# Patient Record
Sex: Female | Born: 1950 | Race: Asian | Hispanic: No | Smoking: Never smoker
Health system: Southern US, Community
[De-identification: ages and names within clinical notes are randomized; demographics above are authoritative.]

## PROBLEM LIST (undated history)

## (undated) DIAGNOSIS — I209 Angina pectoris, unspecified: Secondary | ICD-10-CM

## (undated) DIAGNOSIS — M5416 Radiculopathy, lumbar region: Secondary | ICD-10-CM

## (undated) DIAGNOSIS — K219 Gastro-esophageal reflux disease without esophagitis: Secondary | ICD-10-CM

## (undated) DIAGNOSIS — F32A Depression, unspecified: Secondary | ICD-10-CM

## (undated) DIAGNOSIS — I10 Essential (primary) hypertension: Secondary | ICD-10-CM

## (undated) DIAGNOSIS — M810 Age-related osteoporosis without current pathological fracture: Secondary | ICD-10-CM

## (undated) DIAGNOSIS — S92309A Fracture of unspecified metatarsal bone(s), unspecified foot, initial encounter for closed fracture: Secondary | ICD-10-CM

## (undated) DIAGNOSIS — M1990 Unspecified osteoarthritis, unspecified site: Secondary | ICD-10-CM

## (undated) HISTORY — DX: Fracture of unspecified metatarsal bone(s), unspecified foot, initial encounter for closed fracture: S92.309A

## (undated) HISTORY — DX: Unspecified osteoarthritis, unspecified site: M19.90

## (undated) HISTORY — PX: ABDOMINAL HYSTERECTOMY: SHX81

## (undated) HISTORY — DX: Age-related osteoporosis without current pathological fracture: M81.0

## (undated) HISTORY — PX: APPENDECTOMY: SHX54

## (undated) HISTORY — DX: Radiculopathy, lumbar region: M54.16

## (undated) HISTORY — DX: Essential (primary) hypertension: I10

---

## 2013-01-30 ENCOUNTER — Ambulatory Visit (INDEPENDENT_AMBULATORY_CARE_PROVIDER_SITE_OTHER): Payer: No Typology Code available for payment source | Admitting: Podiatry

## 2013-01-30 ENCOUNTER — Encounter: Payer: Self-pay | Admitting: Podiatry

## 2013-01-30 VITALS — BP 156/94 | HR 71 | Ht 61.0 in | Wt 148.0 lb

## 2013-01-30 DIAGNOSIS — R937 Abnormal findings on diagnostic imaging of other parts of musculoskeletal system: Secondary | ICD-10-CM

## 2013-01-30 DIAGNOSIS — S92309A Fracture of unspecified metatarsal bone(s), unspecified foot, initial encounter for closed fracture: Secondary | ICD-10-CM

## 2013-01-30 DIAGNOSIS — M79673 Pain in unspecified foot: Secondary | ICD-10-CM | POA: Insufficient documentation

## 2013-01-30 DIAGNOSIS — M79609 Pain in unspecified limb: Secondary | ICD-10-CM

## 2013-01-30 DIAGNOSIS — R2991 Unspecified symptoms and signs involving the musculoskeletal system: Secondary | ICD-10-CM

## 2013-01-30 HISTORY — DX: Fracture of unspecified metatarsal bone(s), unspecified foot, initial encounter for closed fracture: S92.309A

## 2013-01-30 NOTE — Patient Instructions (Signed)
X-ray examination reveal fractured Metatarsal base left foot. Cast applied.  Minimum ambulation advised.  Use crutches as needed. Return in 3 weeks to replace cast.

## 2013-01-30 NOTE — Progress Notes (Signed)
Left foot turned over 2 weeks ago and had pain and swelling.  Has had spinal surgery x 2 for bulged out disc at L4,5 about 1978 and 1980 while in [place].   Review of Systems - General ROS: positive for  - chills, fever, sleep disturbance and Difficulty walking from weak left leg.  Ophthalmic ROS: negative for - decreased vision, double vision, eye pain or loss of vision ENT ROS: Had fallen while showering and ruptured left ear drum on 01/01/13. Being under care by ENT doctor.  Allergy and Immunology ROS: negative Respiratory ROS: no cough, shortness of breath, or wheezing Cardiovascular ROS: no chest pain or dyspnea on exertion Gastrointestinal ROS: no abdominal pain, change in bowel habits, or black or bloody stools Genito-Urinary ROS: no dysuria, trouble voiding, or hematuria Musculoskeletal ROS: Left foot and knee has lost strength with disc problem.  Neurological ROS: no TIA or stroke symptoms Dermatological ROS: negative.  Objective: Dermatologic: No open lesions. Nails are within normal. Vascular: All pedal pulses are palpable.  Orthopedic: Rectus foot without gross osseous deformity. No muscle strength of Peroneal muscle group and weak   Neurologic: All epicritic and tactile sensations grossly intact. X-ray show transverse fracture at the 5th Metatarsal base left. Presence of Naviculocuneiform bar bilateral.  Assessment: Fractured Metatarsal base left 5th.  Treatment: Casted left loewr limb with fiber glass. Return in 3 weeks to replace cast.

## 2013-02-20 ENCOUNTER — Encounter: Payer: Self-pay | Admitting: Podiatry

## 2013-02-20 ENCOUNTER — Ambulatory Visit (INDEPENDENT_AMBULATORY_CARE_PROVIDER_SITE_OTHER): Payer: No Typology Code available for payment source | Admitting: Podiatry

## 2013-02-20 VITALS — BP 158/94 | HR 77

## 2013-02-20 DIAGNOSIS — S92309A Fracture of unspecified metatarsal bone(s), unspecified foot, initial encounter for closed fracture: Secondary | ICD-10-CM

## 2013-02-20 DIAGNOSIS — M79609 Pain in unspecified limb: Secondary | ICD-10-CM

## 2013-02-20 NOTE — Patient Instructions (Signed)
3 weeks follow up on fractured 5th Metatarsal base left. Cast replaced. Return in 3 weeks.

## 2013-02-20 NOTE — Progress Notes (Signed)
3 weeks following cast on left foot for fractured 5th Metatarsal base, Jones' Fracture. Stated that she had minimum pain. Cast removed. Swelling is down. Skin is hyperpigmented following the injury and extensive edema.  Skin is normal. Left lower limb placed back in Fiberglass cast with Cast boot. Instruction to light ambulation. Return in 3 weeks.

## 2013-03-13 ENCOUNTER — Ambulatory Visit (INDEPENDENT_AMBULATORY_CARE_PROVIDER_SITE_OTHER): Payer: No Typology Code available for payment source | Admitting: Podiatry

## 2013-03-13 ENCOUNTER — Encounter: Payer: Self-pay | Admitting: Podiatry

## 2013-03-13 VITALS — BP 178/93 | HR 73

## 2013-03-13 DIAGNOSIS — S92309A Fracture of unspecified metatarsal bone(s), unspecified foot, initial encounter for closed fracture: Secondary | ICD-10-CM

## 2013-03-13 NOTE — Progress Notes (Signed)
6 weeks following cast placed in left foot. This was the 2nd cast after changing the first cast 3 weeks ago. Patient denies having any discomfort. The cast is worn out in forefoot area. Cast was removed. There was no edema or abnormal appearance on the left foot at affected area. X-ray taken and noted of positive signs of bone healing with bone mass present in the fracture site.   Assessment: Satisfactory bone healing following Yetta Barre' fracture left 5th Metatarsal base.  Plan: Cast removed. Instructed to be in well supported shoe gear.

## 2013-03-13 NOTE — Patient Instructions (Signed)
Good bone healing of the 5th Metatarsal base fracture seen in X-ray.  Stay in well supported shoes for the next few weeks.

## 2013-09-13 ENCOUNTER — Other Ambulatory Visit: Payer: Self-pay | Admitting: Podiatry

## 2013-09-13 DIAGNOSIS — M25569 Pain in unspecified knee: Secondary | ICD-10-CM

## 2014-10-21 ENCOUNTER — Ambulatory Visit (INDEPENDENT_AMBULATORY_CARE_PROVIDER_SITE_OTHER): Payer: 59 | Admitting: Podiatry

## 2014-10-21 ENCOUNTER — Encounter: Payer: Self-pay | Admitting: Podiatry

## 2014-10-21 VITALS — BP 160/83 | HR 72 | Ht 61.0 in | Wt 146.0 lb

## 2014-10-21 DIAGNOSIS — R609 Edema, unspecified: Secondary | ICD-10-CM | POA: Diagnosis not present

## 2014-10-21 DIAGNOSIS — S92302A Fracture of unspecified metatarsal bone(s), left foot, initial encounter for closed fracture: Secondary | ICD-10-CM

## 2014-10-21 DIAGNOSIS — S92309A Fracture of unspecified metatarsal bone(s), unspecified foot, initial encounter for closed fracture: Secondary | ICD-10-CM | POA: Insufficient documentation

## 2014-10-21 DIAGNOSIS — M79672 Pain in left foot: Secondary | ICD-10-CM | POA: Diagnosis not present

## 2014-10-21 DIAGNOSIS — R600 Localized edema: Secondary | ICD-10-CM

## 2014-10-21 NOTE — Progress Notes (Signed)
Subjective: 64 year old female presents with limping gait stating that she slipped on a stairs and her foot turned in 2 weeks ago. The foot became swollen and painful. The pain is not subsiding after two weeks and wants to be checked out.   Objective: Dermatologic: No open lesions. Nails are within normal. Vascular: All pedal pulses are palpable. Mild edema on left foot lateral 1/2. No discoloration noted.  Orthopedic: Rectus foot without gross osseous deformity. Pain in left foot with weight bearing.  Neurologic: All epicritic and tactile sensations grossly intact. X-ray show a oblique fracture of 5th Metatarsal shaft at mid point with separation of fracture fragment and space in between the fracture.   Assessment: Spiral fracture of Metatarsal shaft at mid point with significant gap inbetween the proximal and distal fracture fragment.   Plan: Reviewed the findings and available treatment options. Reviewed open reduction and internal fixation of the fracture fragment under IV sedation at surgery center.  Surgery consent form reviewed. Left lower limb placed in [place] and CAM walker.

## 2014-10-21 NOTE — Patient Instructions (Signed)
Seen for pain in left foot. X-rau findings reveal fractured bone. Left foot placed in [place] and CAM walker. Scheduled for Open reduction and internal fixation left fractured bone.

## 2014-10-23 DIAGNOSIS — S92309A Fracture of unspecified metatarsal bone(s), unspecified foot, initial encounter for closed fracture: Secondary | ICD-10-CM | POA: Diagnosis not present

## 2014-10-23 HISTORY — PX: OPEN REDUCTION INTERNAL FIXATION (ORIF) DISTAL PHALANX: SHX6236

## 2014-10-28 ENCOUNTER — Encounter: Payer: 59 | Admitting: Podiatry

## 2014-10-31 ENCOUNTER — Ambulatory Visit (INDEPENDENT_AMBULATORY_CARE_PROVIDER_SITE_OTHER): Payer: 59 | Admitting: Podiatry

## 2014-10-31 ENCOUNTER — Encounter: Payer: Self-pay | Admitting: Podiatry

## 2014-10-31 VITALS — BP 185/83 | HR 76

## 2014-10-31 DIAGNOSIS — S92301D Fracture of unspecified metatarsal bone(s), right foot, subsequent encounter for fracture with routine healing: Secondary | ICD-10-CM

## 2014-10-31 NOTE — Patient Instructions (Signed)
Cast replaced. Minimum ambulation and use crutches.

## 2014-10-31 NOTE — Progress Notes (Signed)
1 week post op following ORIF left 5th metatarsal. Patient came in left lower limb in cast walking without crutches or walker.  Stated that her cast is uncomfortable to stand on. Cast removed and examined foot. No edema or abnormal finding. Wound is healing normal. Fiberglass cast replaced.

## 2014-11-28 ENCOUNTER — Ambulatory Visit (INDEPENDENT_AMBULATORY_CARE_PROVIDER_SITE_OTHER): Payer: 59 | Admitting: Podiatry

## 2014-11-28 ENCOUNTER — Encounter: Payer: Self-pay | Admitting: Podiatry

## 2014-11-28 ENCOUNTER — Encounter: Payer: 59 | Admitting: Podiatry

## 2014-11-28 VITALS — BP 165/100 | HR 73

## 2014-11-28 DIAGNOSIS — S92301D Fracture of unspecified metatarsal bone(s), right foot, subsequent encounter for fracture with routine healing: Secondary | ICD-10-CM

## 2014-11-28 NOTE — Patient Instructions (Signed)
Normal wound healing. Use CAM walker while weight bearing for the next 3 weeks. Return as needed.

## 2014-11-28 NOTE — Progress Notes (Signed)
5 weeks following ORIF for fractured 5th Metatarsal shaft.  Been on cast since the surgery. Patient has tolerated cast well with exception of occasional pain at the wound site. Cast removed.Wound has healed well. No edema or erythema noted. X-ray show good bone healing with bone callus surrounding the fracture site. Fracture fragments are in good approximation with internal fixation screw in place.across the fracture line. Assessment: Satisfactory bone healing. Plan: Place in CAM walker for the next 3 weeks. Patient wants to use the CAM walker she has at home.

## 2015-02-10 ENCOUNTER — Ambulatory Visit (INDEPENDENT_AMBULATORY_CARE_PROVIDER_SITE_OTHER): Payer: 59 | Admitting: Podiatry

## 2015-02-10 ENCOUNTER — Encounter: Payer: Self-pay | Admitting: Podiatry

## 2015-02-10 VITALS — BP 196/93 | HR 67

## 2015-02-10 DIAGNOSIS — M659 Synovitis and tenosynovitis, unspecified: Secondary | ICD-10-CM | POA: Diagnosis not present

## 2015-02-10 DIAGNOSIS — R2991 Unspecified symptoms and signs involving the musculoskeletal system: Secondary | ICD-10-CM

## 2015-02-10 DIAGNOSIS — M79605 Pain in left leg: Secondary | ICD-10-CM

## 2015-02-10 DIAGNOSIS — M79673 Pain in unspecified foot: Secondary | ICD-10-CM | POA: Diagnosis not present

## 2015-02-10 DIAGNOSIS — S92301D Fracture of unspecified metatarsal bone(s), right foot, subsequent encounter for fracture with routine healing: Secondary | ICD-10-CM | POA: Diagnosis not present

## 2015-02-10 DIAGNOSIS — M7742 Metatarsalgia, left foot: Secondary | ICD-10-CM | POA: Diagnosis not present

## 2015-02-10 DIAGNOSIS — M259 Joint disorder, unspecified: Secondary | ICD-10-CM | POA: Diagnosis not present

## 2015-02-10 DIAGNOSIS — M79606 Pain in leg, unspecified: Secondary | ICD-10-CM | POA: Insufficient documentation

## 2015-02-10 DIAGNOSIS — G8929 Other chronic pain: Secondary | ICD-10-CM | POA: Insufficient documentation

## 2015-02-10 NOTE — Patient Instructions (Signed)
Seen for left foot pain. Abnormal bone structure. May benefit from Ankle brace left foot.

## 2015-02-10 NOTE — Progress Notes (Signed)
Subjective: 64 year old female presents complaining of pain on left foot.  Patient points lateral column of left foot being the source of foot pain. Patient has a history of fractured 5th metatarsal bone that was repaired through Open Reduction Internal Fixation with screw.  S/P ORIF with screw fixation 5th Metatarsal left foot 3 months and 2 weeks.  Injury occurred 10/07/14, repair done 10/23/14. Also has history of spinal surgery x 2 for bulged out disc at L4,5 about 1978 and 1980 while in [place].   Objective: Dermatologic: Old surgical site healed well. No abnormal skin lesions noted. Vascular: All pedal pulses are palpable.  Orthopedic: Positive of elevated first ray left with varus forefoot.  No muscle strength of Peroneal muscle group weak ness left  Neurologic: All epicritic and tactile sensations grossly intact.  X-ray of left foot repeated. Findings reveal well consolidated fracture site 5th metatarsal shaft with screw in place left foot. Significant dorsal elevation of the first ray left foot. Mild osteoporosis observed in forefoot.   Assessment: Tenosynovitis lateral column left foot. Peroneal tendon dysfunction left. Elevated first ray left.  Treatment: Reviewed clinical findings and available treatment options. Left foot placed in Ankle brace.  Return as needed.

## 2015-04-08 ENCOUNTER — Ambulatory Visit (INDEPENDENT_AMBULATORY_CARE_PROVIDER_SITE_OTHER): Payer: 59 | Admitting: Podiatry

## 2015-04-08 ENCOUNTER — Inpatient Hospital Stay: Admission: RE | Admit: 2015-04-08 | Payer: Self-pay | Source: Ambulatory Visit

## 2015-04-08 ENCOUNTER — Encounter: Payer: Self-pay | Admitting: Podiatry

## 2015-04-08 VITALS — BP 151/72 | HR 70

## 2015-04-08 DIAGNOSIS — S93409A Sprain of unspecified ligament of unspecified ankle, initial encounter: Secondary | ICD-10-CM | POA: Insufficient documentation

## 2015-04-08 DIAGNOSIS — S93402A Sprain of unspecified ligament of left ankle, initial encounter: Secondary | ICD-10-CM | POA: Diagnosis not present

## 2015-04-08 DIAGNOSIS — M21962 Unspecified acquired deformity of left lower leg: Secondary | ICD-10-CM

## 2015-04-08 DIAGNOSIS — S99922A Unspecified injury of left foot, initial encounter: Secondary | ICD-10-CM | POA: Diagnosis not present

## 2015-04-08 NOTE — Patient Instructions (Signed)
Reviewed clinical findings and available treatment options. Patient and her family wishes to have the foot problem corrected. Cotton osteotomy with bone graft discussed for possible surgical option. Consent form reviewed and signed.

## 2015-04-08 NOTE — Progress Notes (Signed)
Subjective: 64 year old female presents complaining of pain in left foot. She turned over the foot last week and now it hurts on top and side of left foot.   Past history reveal she has had spinal surgery x 2 for bulged out disc at L4,5 about 1978 and 1980 while in [place].  Recent left foot ORIF on 5th Metatarsal done in July 2016 and healed without complication.  Review of Systems - General ROS: Negative for - chills, fever, sleep disturbance and Difficulty walking from weak left leg.  Ophthalmic ROS: negative for - decreased vision, double vision, eye pain or loss of vision ENT ROS: Had fallen while showering and ruptured left ear drum on 01/01/13. Being under care by ENT doctor.  Allergy and Immunology ROS: negative Respiratory ROS: no cough, shortness of breath, or wheezing Cardiovascular ROS: no chest pain or dyspnea on exertion. Takes antihypertensive medications.  Gastrointestinal ROS: no abdominal pain, change in bowel habits, or black or bloody stools Genito-Urinary ROS: no dysuria, trouble voiding, or hematuria Musculoskeletal ROS: Weak left lower limb following the disc problem.  Neurological ROS: no TIA or stroke symptoms Dermatological ROS: negative.  Objective: Dermatologic: No open lesions. Nails are within normal. Vascular: All pedal pulses are palpable.  Orthopedic: Positive of forefoot varus with elevated first ray. Pain with range of motion in rearfoot over 4th and 5th metatarsal base.  Neurologic: All epicritic and tactile sensations grossly intact.  X-ray of left foot show post surgical internal fixation screw over transverse fracture site at the mid point of 5th Metatarsal shaft left foot. No other acute changes seen over painful site.  Assessment: Ankle sprain with pain in foot. Forefoot varus with elevated first Metatarsal bone.  Frequent loss of balance and frequent ankle sprain.  Plan: Reviewed clinical findings and available treatment options. Patient  and her family wishes to have the foot problem corrected. Cotton osteotomy with bone graft discussed for possible surgical option. Consent form reviewed and signed.

## 2015-04-15 ENCOUNTER — Other Ambulatory Visit: Payer: Self-pay | Admitting: Podiatry

## 2015-04-15 MED ORDER — OXYCODONE-ACETAMINOPHEN 7.5-325 MG PO TABS: 1.0000 | ORAL_TABLET | Freq: Four times a day (QID) | ORAL | Status: DC | PRN

## 2015-04-15 MED ORDER — NABUMETONE 500 MG PO TABS: 500.0000 mg | ORAL_TABLET | Freq: Two times a day (BID) | ORAL | Status: DC

## 2015-04-16 DIAGNOSIS — M216X9 Other acquired deformities of unspecified foot: Secondary | ICD-10-CM | POA: Diagnosis not present

## 2015-04-16 HISTORY — PX: OTHER SURGICAL HISTORY: SHX169

## 2015-04-19 ENCOUNTER — Other Ambulatory Visit: Payer: Self-pay | Admitting: Podiatry

## 2015-04-19 MED ORDER — PROMETHAZINE HCL 12.5 MG PO TABS: 12.5000 mg | ORAL_TABLET | Freq: Four times a day (QID) | ORAL | Status: DC | PRN

## 2015-04-23 ENCOUNTER — Encounter: Payer: Self-pay | Admitting: Podiatry

## 2015-04-23 ENCOUNTER — Ambulatory Visit (INDEPENDENT_AMBULATORY_CARE_PROVIDER_SITE_OTHER): Payer: 59 | Admitting: Podiatry

## 2015-04-23 VITALS — BP 146/72 | HR 85

## 2015-04-23 DIAGNOSIS — Z9889 Other specified postprocedural states: Secondary | ICD-10-CM | POA: Insufficient documentation

## 2015-04-23 NOTE — Progress Notes (Signed)
S/P 1 week Cotton osteotomy left (04/15/15). Had nausea problem with medication. Did better since taking Phenergan. No problem with cast. Doing exercise and able to wiggle toes. No sign of edema. Colors normal. Continue current level of activity and return in 2 weeks.

## 2015-04-23 NOTE — Patient Instructions (Signed)
Status post one week left foot surgery. Doing well in cast. Continue current level of activity. Return in 2 weeks.

## 2015-05-07 ENCOUNTER — Inpatient Hospital Stay: Admission: RE | Admit: 2015-05-07 | Payer: Self-pay | Source: Ambulatory Visit

## 2015-05-07 ENCOUNTER — Ambulatory Visit (INDEPENDENT_AMBULATORY_CARE_PROVIDER_SITE_OTHER): Payer: BLUE CROSS/BLUE SHIELD | Admitting: Podiatry

## 2015-05-07 ENCOUNTER — Encounter: Payer: Self-pay | Admitting: Podiatry

## 2015-05-07 DIAGNOSIS — M79605 Pain in left leg: Secondary | ICD-10-CM | POA: Diagnosis not present

## 2015-05-07 DIAGNOSIS — M21962 Unspecified acquired deformity of left lower leg: Secondary | ICD-10-CM

## 2015-05-07 NOTE — Patient Instructions (Signed)
Cast replaced. Continue with current level of care. Return in 3 weeks.

## 2015-05-07 NOTE — Progress Notes (Signed)
S/P 3 week Cotton osteotomy left (04/15/15). Patient has done well with cast on left lower limb.  Cast removed.  Incision site healed well.  Minimum forefoot edema noted.  Post op X-ray show grafted bone in place secured by a staple implant, consistent with the surgery performed. No abnormal findings noted.   Left lower limb cleansed and placed back in Fiberglass cast with cast boot. Return in 3 weeks.

## 2015-05-27 ENCOUNTER — Inpatient Hospital Stay: Admission: RE | Admit: 2015-05-27 | Payer: Self-pay | Source: Ambulatory Visit

## 2015-05-27 ENCOUNTER — Encounter: Payer: Self-pay | Admitting: Podiatry

## 2015-05-27 ENCOUNTER — Ambulatory Visit (INDEPENDENT_AMBULATORY_CARE_PROVIDER_SITE_OTHER): Payer: BLUE CROSS/BLUE SHIELD | Admitting: Podiatry

## 2015-05-27 DIAGNOSIS — M79605 Pain in left leg: Secondary | ICD-10-CM | POA: Diagnosis not present

## 2015-05-27 DIAGNOSIS — M21962 Unspecified acquired deformity of left lower leg: Secondary | ICD-10-CM

## 2015-05-27 NOTE — Patient Instructions (Signed)
6 weeks post op wound healing normal. Cast removed. Placed in CAM walker. Return in 3 weeks. 

## 2015-05-27 NOTE — Progress Notes (Signed)
6 week post op following Cotton osteotomy with bone graft and staple fixation left foot. Patient denies any pain other than tightness after been standing or walking for a while. Cast removed. Wound healed well. 6 weeks post op X-ray show well maintained graft with staple in place. No sign of displacement noted. Normal finding with grafted bone. Corrected first ray with sufficient plantar flexion of the first ray noted. Normal post op X-ray left foot. Left foot placed in CAM walker. Return in 3 weeks.

## 2015-06-17 ENCOUNTER — Encounter: Payer: Self-pay | Admitting: Podiatry

## 2015-06-17 ENCOUNTER — Ambulatory Visit (INDEPENDENT_AMBULATORY_CARE_PROVIDER_SITE_OTHER): Payer: BLUE CROSS/BLUE SHIELD | Admitting: Podiatry

## 2015-06-17 DIAGNOSIS — Z9889 Other specified postprocedural states: Secondary | ICD-10-CM

## 2015-06-17 NOTE — Patient Instructions (Signed)
Post op wound healing satisfactory.  May use regular walking shoes with orthotics. Return as needed.

## 2015-06-17 NOTE — Progress Notes (Signed)
9 weeks post op following Cotton osteotomy with bone graft and staple fixation left foot. Patient denies any problem. Been walking at home to increase mobility.  Stated that she is able to see the foot is moving straighter than was before.   Normal post op finding with grafted bone. Corrected first ray with sufficient plantar flexion of the first ray noted.  May try regular shoes with CAM walker if needed. OTC hard shell orthotics dispensed x 3 pr.   Return as needed.

## 2015-08-05 ENCOUNTER — Ambulatory Visit (INDEPENDENT_AMBULATORY_CARE_PROVIDER_SITE_OTHER): Payer: BLUE CROSS/BLUE SHIELD | Admitting: Podiatry

## 2015-08-05 ENCOUNTER — Encounter: Payer: Self-pay | Admitting: Podiatry

## 2015-08-05 VITALS — BP 126/62 | HR 75

## 2015-08-05 DIAGNOSIS — M79605 Pain in left leg: Secondary | ICD-10-CM

## 2015-08-05 DIAGNOSIS — M659 Synovitis and tenosynovitis, unspecified: Secondary | ICD-10-CM

## 2015-08-05 DIAGNOSIS — R609 Edema, unspecified: Secondary | ICD-10-CM | POA: Diagnosis not present

## 2015-08-05 DIAGNOSIS — R600 Localized edema: Secondary | ICD-10-CM

## 2015-08-05 NOTE — Progress Notes (Signed)
Subjective: 4 months S/P Cotton osteotomy with bone graft and staple fixation left foot (04/15/15). Patient presents complaining of tightness and spastic pain at times at medial arch plantar aspect of the surgical site. Pain comes in random. She was able to see her foot is doing better following the surgery.  Objective: Well healed surgical site. Positive of bilateral ankle edema. Maintained plantar flexion of the first ray left foot following the procedure.  Assessment: Satisfactory post op wound healing with tightness at instep area left foot near surgical site. Tenosynovitis left medial arch, near Posterior tibialis tendon attachment site.  Plan: Reviewed clinical findings and available treatment options. Left medial arch near surgical site injected with mixture of 4 mg Dexamethasone, 4 mg Triamcinolone, and 1 cc of 0.5% Marcaine plain. Patient tolerated well without difficulty.   Return as needed.

## 2015-08-05 NOTE — Patient Instructions (Signed)
Pain in left foot arch area with tightness and cramp at times. Cortisone injection given.

## 2015-08-14 ENCOUNTER — Other Ambulatory Visit: Payer: Self-pay | Admitting: Podiatry

## 2015-08-14 ENCOUNTER — Telehealth: Payer: Self-pay | Admitting: *Deleted

## 2015-08-14 MED ORDER — CYCLOBENZAPRINE HCL 5 MG PO TABS: 5.0000 mg | ORAL_TABLET | Freq: Three times a day (TID) | ORAL | Status: DC | PRN

## 2015-08-14 MED ORDER — OXYCODONE-ACETAMINOPHEN 7.5-325 MG PO TABS: 1.0000 | ORAL_TABLET | Freq: Four times a day (QID) | ORAL | Status: DC | PRN

## 2015-08-14 NOTE — Telephone Encounter (Signed)
Pt's husband walked in and stated patient's feet and legs were cramping and numb. Requested medication.

## 2015-08-25 ENCOUNTER — Other Ambulatory Visit: Payer: Self-pay | Admitting: Podiatry

## 2015-08-25 MED ORDER — CYCLOBENZAPRINE HCL 10 MG PO TABS: 10.0000 mg | ORAL_TABLET | Freq: Three times a day (TID) | ORAL | Status: DC | PRN

## 2015-09-02 ENCOUNTER — Encounter: Payer: Self-pay | Admitting: Podiatry

## 2015-09-02 ENCOUNTER — Ambulatory Visit (INDEPENDENT_AMBULATORY_CARE_PROVIDER_SITE_OTHER): Payer: BLUE CROSS/BLUE SHIELD | Admitting: Podiatry

## 2015-09-02 VITALS — BP 150/79 | HR 89

## 2015-09-02 DIAGNOSIS — M722 Plantar fascial fibromatosis: Secondary | ICD-10-CM

## 2015-09-02 DIAGNOSIS — M79605 Pain in left leg: Secondary | ICD-10-CM | POA: Diagnosis not present

## 2015-09-02 NOTE — Patient Instructions (Signed)
Repeat injection given to left arch, plantar fascial band.

## 2015-09-02 NOTE — Progress Notes (Signed)
Subjective: 5 months S/P Cotton osteotomy with bone graft and staple fixation left foot (04/15/15). Patient presents stating that last injection has helped a lot. The arch is beginning to tighten up again and wishes to have another cortisone injection.   Objective: Well healed surgical site. Positive of bilateral ankle edema. Maintained plantar flexion of the first ray left foot following the procedure. Pain at the left plantar fascial band at mid center.   Assessment: Satisfactory post op wound healing with tightness at instep area left foot near surgical site. Tenosynovitis left medial arch, at center plantar fascial band.   Plan: Reviewed clinical findings and available treatment options. Left medial arch center plantar fascial band was injected with mixture of 4 mg Dexamethasone, 4 mg Triamcinolone, and 1 cc of 0.5% Marcaine plain.  Patient tolerated well without difficulty. Return as needed.

## 2015-10-08 ENCOUNTER — Ambulatory Visit (INDEPENDENT_AMBULATORY_CARE_PROVIDER_SITE_OTHER): Payer: BLUE CROSS/BLUE SHIELD | Admitting: Podiatry

## 2015-10-08 ENCOUNTER — Encounter: Payer: Self-pay | Admitting: Podiatry

## 2015-10-08 VITALS — BP 139/76 | HR 96

## 2015-10-08 DIAGNOSIS — M722 Plantar fascial fibromatosis: Secondary | ICD-10-CM | POA: Diagnosis not present

## 2015-10-08 DIAGNOSIS — M79605 Pain in left leg: Secondary | ICD-10-CM

## 2015-10-08 DIAGNOSIS — M65979 Unspecified synovitis and tenosynovitis, unspecified ankle and foot: Secondary | ICD-10-CM

## 2015-10-08 DIAGNOSIS — M659 Synovitis and tenosynovitis, unspecified: Secondary | ICD-10-CM | POA: Diagnosis not present

## 2015-10-08 NOTE — Progress Notes (Signed)
Subjective: 6 months S/P Cotton osteotomy with bone graft and staple fixation left foot (04/15/15). Patient presents stating that last injection has helped a lot. The arch is beginning to tighten up and painful after been on feet for a while. Patient request for another cortisone injection.  Having leg cramps at night. Flexeril is not helping at times.  Objective: Well healed surgical site. Maintained plantar flexion of the first ray left foot following the procedure. Pain at the left plantar fascial band at mid center.  Positive of excess ankle pronation left foot.   Assessment: Satisfactory post op wound healing with tightness at instep area left foot near surgical site. Tenosynovitis left medial arch, at center plantar fascial band due to excess ankle pronation left foot.   Plan: Reviewed clinical findings and available treatment options. Left medial arch center plantar fascial band was injected with mixture of 4 mg Dexamethasone, 4 mg Triamcinolone, and 1 cc of 0.5% Marcaine plain.  Patient tolerated well without difficulty. Called in for Quinine for leg cramps. Return as needed.

## 2015-10-08 NOTE — Patient Instructions (Signed)
Cortisone injection to left mid arch given as per request. May use quinine for leg cramp. Return as needed.

## 2015-11-10 ENCOUNTER — Other Ambulatory Visit: Payer: Self-pay | Admitting: Podiatry

## 2015-11-10 MED ORDER — CYCLOBENZAPRINE HCL 10 MG PO TABS: 10.0000 mg | ORAL_TABLET | Freq: Three times a day (TID) | ORAL | Status: DC | PRN

## 2016-10-05 ENCOUNTER — Telehealth: Payer: Self-pay | Admitting: *Deleted

## 2016-10-05 ENCOUNTER — Ambulatory Visit: Payer: Medicare Other | Admitting: Podiatry

## 2016-10-05 MED ORDER — CYCLOBENZAPRINE HCL 10 MG PO TABS: 10.0000 mg | ORAL_TABLET | Freq: Three times a day (TID) | ORAL | 3 refills | Status: DC | PRN

## 2016-10-05 NOTE — Telephone Encounter (Signed)
Requests medication refill

## 2016-10-06 ENCOUNTER — Ambulatory Visit (INDEPENDENT_AMBULATORY_CARE_PROVIDER_SITE_OTHER): Payer: Medicare Other | Admitting: Podiatry

## 2016-10-06 ENCOUNTER — Encounter: Payer: Self-pay | Admitting: Podiatry

## 2016-10-06 DIAGNOSIS — M21962 Unspecified acquired deformity of left lower leg: Secondary | ICD-10-CM

## 2016-10-06 DIAGNOSIS — M659 Synovitis and tenosynovitis, unspecified: Secondary | ICD-10-CM

## 2016-10-06 DIAGNOSIS — M7742 Metatarsalgia, left foot: Secondary | ICD-10-CM

## 2016-10-06 MED ORDER — NABUMETONE 500 MG PO TABS: 500.0000 mg | ORAL_TABLET | Freq: Two times a day (BID) | ORAL | 3 refills | Status: DC

## 2016-10-06 MED ORDER — DICLOFENAC SODIUM 1 % TD GEL: 2.0000 g | Freq: Four times a day (QID) | TRANSDERMAL | 3 refills | Status: DC

## 2016-10-06 NOTE — Patient Instructions (Signed)
Seen for pain in left foot. Flexeril and Relafen prescribed. Return as needed.

## 2016-10-06 NOTE — Progress Notes (Signed)
Subjective: 66 year old female presents complaining of pain in left foot. This is S/P Cotton osteotomy with bone graft and staple fixation left foot (04/15/15). Having leg cramps at night. Flexeril is helping and need more prescription. Also wishes to have topical cream for foot pain.  Objective: Maintained plantar flexion of the first ray left foot following the procedure. Pain at lateral column left foot. Positive of excess ankle pronation left foot.   Assessment: Satisfactory recovery from previous surgery. Tenosynovitis left lateral column due to excess ankle pronation left foot.   Plan: Reviewed clinical findings and available treatment options. Relafen 500 mg bid, Flexeril 100 mg tid prescribed. Return as needed.

## 2016-12-06 ENCOUNTER — Other Ambulatory Visit: Payer: Self-pay | Admitting: Podiatry

## 2017-04-06 ENCOUNTER — Other Ambulatory Visit: Payer: Self-pay | Admitting: Sports Medicine

## 2017-04-06 DIAGNOSIS — S22000A Wedge compression fracture of unspecified thoracic vertebra, initial encounter for closed fracture: Secondary | ICD-10-CM

## 2017-04-11 ENCOUNTER — Ambulatory Visit
Admission: RE | Admit: 2017-04-11 | Discharge: 2017-04-11 | Disposition: A | Discharge status: Home / Self Care | Payer: Self-pay | Source: Ambulatory Visit | Attending: Sports Medicine | Admitting: Sports Medicine

## 2017-04-11 ENCOUNTER — Encounter: Payer: Self-pay | Admitting: Radiology

## 2017-04-11 DIAGNOSIS — S22000A Wedge compression fracture of unspecified thoracic vertebra, initial encounter for closed fracture: Secondary | ICD-10-CM

## 2017-04-11 HISTORY — PX: IR RADIOLOGIST EVAL & MGMT: IMG5224

## 2017-04-11 NOTE — Consult Note (Signed)
Chief Complaint: Patient was seen in consultation today for  Chief Complaint  Patient presents with  . Advice Only    Consult for T12 Kyphoplasty   at the request of Begovich,John Emil  Referring Physician(s): Begovich,John Emil  History of Present Illness: Amber Mccall is a 66 y.o. female with a history of osteoporosis suffered a fall backwards out of a chair approximately 3 months ago and developed acute mid back pain. She was initiated on conservative therapy but due to persistent daily pain, an MRI was ordered on 03/31/2017. The MRI demonstrated a moderate compression fracture of the T12 vertebral body with persistent signal abnormality on STIR imaging consistent with an unhealed subacute fracture.  She is now referred at the kind request of Dr. Arrie Eastern to discuss possible cement augmentation.  She is accompanied by her son who serves as an Astronomer. Her pain is fairly constant but exacerbated by long periods of sitting, and also transitions in position, particularly in the morning when she gets up out of bed. At its worst, she rates her pain an 8 or 9 out of 10 on a 10 point scale. She is currently wearing a TLSO brace. She also takes narcotic pain medication intermittently with Tylenol. The brace and a pain medication helps somewhat but not very much.  She denies paresthesias, numbness, tingling, changes in bowel or bladder habits.  Past Medical History:  Diagnosis Date  . Closed fracture of metatarsal bone(s) 01/30/2013    Past Surgical History:  Procedure Laterality Date  . Cotton Osteotomy w/ Bone Graft Left 04/16/2015   Left foot  . IR RADIOLOGIST EVAL & MGMT  04/11/2017  . OPEN REDUCTION INTERNAL FIXATION (ORIF) DISTAL PHALANX Left 10/23/2014   5th Metatarsal    Allergies: Patient has no known allergies.  Medications: Prior to Admission medications   Medication Sig Start Date End Date Taking? Authorizing Provider  amLODipine (NORVASC) 10 MG tablet Take 10 mg  by mouth. 03/31/15 03/30/16  [provider]  cyclobenzaprine (FLEXERIL) 10 MG tablet Take 1 tablet (10 mg total) by mouth 3 (three) times daily as needed for muscle spasms. 10/05/16   Sheard, Myeong O, DPM  diclofenac sodium (VOLTAREN) 1 % GEL Apply 2 g topically 4 (four) times daily. 10/06/16   Sheard, Myeong O, DPM  FLUVIRIN SUSP ADM 0.5ML IM UTD 02/10/15   [provider]  Glucosamine Sulfate 500 MG TABS Take by mouth.    [provider]  losartan (COZAAR) 25 MG tablet  03/03/15   [provider]  Lutein 6 MG CAPS Take by mouth.    [provider]  nabumetone (RELAFEN) 500 MG tablet TAKE 1 TABLET(500 MG) BY MOUTH TWICE DAILY 12/06/16   Sheard, Myeong O, DPM  omeprazole (PRILOSEC) 20 MG capsule Take 20 mg by mouth. 09/02/14   [provider]  promethazine (PHENERGAN) 12.5 MG tablet Take 1 tablet (12.5 mg total) by mouth every 6 (six) hours as needed for nausea or vomiting. 04/19/15   Sheard, Myeong O, DPM     No family history on file.  Social History   Socioeconomic History  . Marital status: Married    Spouse name: Not on file  . Number of children: Not on file  . Years of education: Not on file  . Highest education level: Not on file  Social Needs  . Financial resource strain: Not on file  . Food insecurity - worry: Not on file  . Food insecurity - inability: Not on  file  . Transportation needs - medical: Not on file  . Transportation needs - non-medical: Not on file  Occupational History  . Not on file  Tobacco Use  . Smoking status: Never Smoker  . Smokeless tobacco: Never Used  Substance and Sexual Activity  . Alcohol use: Not on file  . Drug use: Not on file  . Sexual activity: Not on file  Other Topics Concern  . Not on file  Social History Narrative  . Not on file    Review of Systems: A 12 point ROS discussed and pertinent positives are indicated in the HPI above.  All other systems are negative.  Review of  Systems  Vital Signs: BP (!) 156/73   Pulse 88   Temp 98.3 F (36.8 C) (Oral)   Resp 14   Ht 5\' 3"  (1.6 m)   Wt 140 lb (63.5 kg)   SpO2 98%   BMI 24.80 kg/m   Physical Exam  Constitutional: She is oriented to person, place, and time. She appears well-developed and well-nourished. No distress.  HENT:  Head: Normocephalic and atraumatic.  Eyes: No scleral icterus.  Cardiovascular: Normal rate and regular rhythm.  Pulmonary/Chest: Effort normal.  Abdominal: Soft. She exhibits no distension. There is no tenderness.  Musculoskeletal:       Back:  + TTP over the T12 spinous process  Neurological: She is alert and oriented to person, place, and time.  Skin: Skin is warm and dry.  Psychiatric: She has a normal mood and affect.  Nursing note and vitals reviewed.    Imaging: Ir Radiologist Eval & Mgmt  Result Date: 04/11/2017 Please refer to notes tab for details about interventional procedure. (Op Note)   Labs:  CBC: No results for input(s): WBC, HGB, HCT, PLT in the last 8760 hours.  COAGS: No results for input(s): INR, APTT in the last 8760 hours.  BMP: No results for input(s): NA, K, CL, CO2, GLUCOSE, BUN, CALCIUM, CREATININE, GFRNONAA, GFRAA in the last 8760 hours.  Invalid input(s): CMP  LIVER FUNCTION TESTS: No results for input(s): BILITOT, AST, ALT, ALKPHOS, PROT, ALBUMIN in the last 8760 hours.  TUMOR MARKERS: No results for input(s): AFPTM, CEA, CA199, CHROMGRNA in the last 8760 hours.  Assessment and Plan:  66 year old female with a subacute but unhealed T12 compression fracture. She has failed conservative therapy with persistent 9 out of 10 pain despite use of a TLSO brace and narcotic pain medications for the past 3 months. Her fracture anatomy makes her a good candidate for percutaneous cement augmentation.  1.)  Schedule for cement augmentation with probable kyphoplasty to be performed at [facility].  Thank you for this interesting  consult.  I greatly enjoyed meeting Amber Mccall and look forward to participating in their care.  A copy of this report was sent to the requesting provider on this date.  Electronically Signed: Jacqulynn Cadet 04/11/2017, 9:09 AM   I spent a total of  40 Minutes  in face to face in clinical consultation, greater than 50% of which was counseling/coordinating care for subacute T12 compression fracture.

## 2017-11-21 ENCOUNTER — Ambulatory Visit: Payer: Medicare Other | Admitting: Podiatry

## 2017-11-21 ENCOUNTER — Encounter: Payer: Self-pay | Admitting: Podiatry

## 2017-11-21 DIAGNOSIS — M79672 Pain in left foot: Secondary | ICD-10-CM

## 2017-11-21 DIAGNOSIS — M659 Synovitis and tenosynovitis, unspecified: Secondary | ICD-10-CM

## 2017-11-21 DIAGNOSIS — Y831 Surgical operation with implant of artificial internal device as the cause of abnormal reaction of the patient, or of later complication, without mention of misadventure at the time of the procedure: Secondary | ICD-10-CM | POA: Diagnosis not present

## 2017-11-21 MED ORDER — DICLOFENAC SODIUM 1 % TD GEL: 2.0000 g | Freq: Four times a day (QID) | TRANSDERMAL | 3 refills | Status: DC

## 2017-11-21 MED ORDER — CYCLOBENZAPRINE HCL 10 MG PO TABS: 10.0000 mg | ORAL_TABLET | Freq: Three times a day (TID) | ORAL | 3 refills | Status: DC | PRN

## 2017-11-21 NOTE — Progress Notes (Signed)
Subjective: 67 y.o. year old female accompanied by her husband presents complaining of pain in left foot and cramping in left leg for several months. Patient points arch and dorsum over the old surgery site being the painful area. She has twisted her foot about a month ago and pain is coming in more frequently regular basis. Currently under care for hairline fracture of spinal bone. This is S/P Cotton osteotomy with Staple fixation, ORIF 5th metatarsal Jones fracture in 2017 and 2015.   Objective: Dermatologic: Normal findings. Vascular: Pedal pulses are all palpable. No visible edema or erythema noted. Orthopedic: Post surgical plantar flexed first ray left foot. Pain at instep with range of motion left foot. Neurologic: All epicritic and tactile sensations grossly intact.  Assessment: Intolerance to internal fixation device (staple) left foot following Cotton osteotomy with staple fixation. Tenosynovitis instep left foot.  Treatment: Reviewed clinical findings and available treatment options. Advised to use custom orthotics daily. May benefit from removing internal fixation device (staple). Surgery consent form reviewed for removal of internal fixation device (staple) left foot. Flexeril and Voltaren gel prescribed.

## 2017-11-21 NOTE — Patient Instructions (Signed)
Seen for pain in left foot. Post surgical foot. May benefit from removal of internal fixation device and stay in custom orthotics during weight bearing. Surgery consent form reviewed.

## 2017-11-22 ENCOUNTER — Telehealth: Payer: Self-pay | Admitting: *Deleted

## 2017-11-22 NOTE — Telephone Encounter (Signed)
For CPT code 20680 there is no authorization required. No deductible. There is a facility fee of $250. Patient out of pocket max is $5,800 and $446.82 has been met of this. The plan covers 100% of the allowed amount. Reference # is [id number]

## 2017-11-23 ENCOUNTER — Other Ambulatory Visit: Payer: Self-pay | Admitting: Podiatry

## 2017-11-23 MED ORDER — HYDROCODONE-IBUPROFEN 7.5-200 MG PO TABS: 1.0000 | ORAL_TABLET | Freq: Four times a day (QID) | ORAL | 0 refills | Status: DC | PRN

## 2017-11-24 DIAGNOSIS — T8484XA Pain due to internal orthopedic prosthetic devices, implants and grafts, initial encounter: Secondary | ICD-10-CM

## 2017-11-29 ENCOUNTER — Ambulatory Visit (INDEPENDENT_AMBULATORY_CARE_PROVIDER_SITE_OTHER): Payer: Medicare Other | Admitting: Podiatry

## 2017-11-29 ENCOUNTER — Encounter: Payer: Self-pay | Admitting: Podiatry

## 2017-11-29 DIAGNOSIS — M659 Synovitis and tenosynovitis, unspecified: Secondary | ICD-10-CM

## 2017-11-29 DIAGNOSIS — Y831 Surgical operation with implant of artificial internal device as the cause of abnormal reaction of the patient, or of later complication, without mention of misadventure at the time of the procedure: Secondary | ICD-10-CM | POA: Diagnosis not present

## 2017-11-29 DIAGNOSIS — M79672 Pain in left foot: Secondary | ICD-10-CM

## 2017-11-29 MED ORDER — DICLOFENAC SODIUM 1 % TD GEL: 2.0000 g | Freq: Four times a day (QID) | TRANSDERMAL | 3 refills | Status: DC

## 2017-11-29 NOTE — Patient Instructions (Signed)
5 days S/P Removal of internal fixation (staple) from old Cotton osteotomy with bone graft, 11/24/17.Surgical wound is clean and dry. Well approximated. Proximal end thread knot removed. Post op x-ray reviewed and found to be consistent with the surgery performed. Absence of internal fixation staple that was used for Cotton osteotomy on left foot. Home care instruction given. Voltaren gel re ordered. Instructed to return if anything occurs unexpectedly.

## 2017-11-29 NOTE — Progress Notes (Signed)
Patient ID: Amber Mccall, female   DOB: [birth year], 67 y.o.   MRN: [id number] 5 days S/P Removal of internal fixation (staple) from old Cotton osteotomy with bone graft, 11/24/17. Stated that she has had fever for the first 3 days that were resolved with OTC drug. Also has frequent eruption of fever due to a hairline fracture of her spine. Surgical wound is clean and dry. Well approximated. Proximal end thread knot removed. Post op x-ray reviewed and found to be consistent with the surgery performed. Absence of internal fixation staple that was used for Cotton osteotomy on left foot. Home care instruction given. Voltaren gel re ordered. Instructed to return if anything occurs unexpectedly.

## 2020-04-25 HISTORY — PX: CATARACT EXTRACTION: SUR2

## 2021-04-30 ENCOUNTER — Telehealth: Payer: Self-pay | Admitting: Family Medicine

## 2021-04-30 NOTE — Telephone Encounter (Signed)
Pt wanting to establish with [place]  [name]

## 2021-05-19 ENCOUNTER — Encounter: Payer: Self-pay | Admitting: Family Medicine

## 2021-05-19 ENCOUNTER — Ambulatory Visit (INDEPENDENT_AMBULATORY_CARE_PROVIDER_SITE_OTHER): Payer: Medicare Other | Admitting: Family Medicine

## 2021-05-19 VITALS — BP 122/72 | HR 85 | Temp 98.1°F | Ht 61.0 in | Wt 136.0 lb

## 2021-05-19 DIAGNOSIS — Z23 Encounter for immunization: Secondary | ICD-10-CM | POA: Diagnosis not present

## 2021-05-19 DIAGNOSIS — M150 Primary generalized (osteo)arthritis: Secondary | ICD-10-CM

## 2021-05-19 DIAGNOSIS — Z0000 Encounter for general adult medical examination without abnormal findings: Secondary | ICD-10-CM | POA: Diagnosis not present

## 2021-05-19 DIAGNOSIS — M545 Low back pain, unspecified: Secondary | ICD-10-CM | POA: Insufficient documentation

## 2021-05-19 DIAGNOSIS — Z8781 Personal history of (healed) traumatic fracture: Secondary | ICD-10-CM

## 2021-05-19 DIAGNOSIS — M159 Polyosteoarthritis, unspecified: Secondary | ICD-10-CM

## 2021-05-19 DIAGNOSIS — I10 Essential (primary) hypertension: Secondary | ICD-10-CM | POA: Diagnosis not present

## 2021-05-19 DIAGNOSIS — E781 Pure hyperglyceridemia: Secondary | ICD-10-CM | POA: Diagnosis not present

## 2021-05-19 DIAGNOSIS — M810 Age-related osteoporosis without current pathological fracture: Secondary | ICD-10-CM | POA: Insufficient documentation

## 2021-05-19 DIAGNOSIS — M1990 Unspecified osteoarthritis, unspecified site: Secondary | ICD-10-CM | POA: Insufficient documentation

## 2021-05-19 DIAGNOSIS — G8929 Other chronic pain: Secondary | ICD-10-CM

## 2021-05-19 DIAGNOSIS — M5416 Radiculopathy, lumbar region: Secondary | ICD-10-CM

## 2021-05-19 DIAGNOSIS — F324 Major depressive disorder, single episode, in partial remission: Secondary | ICD-10-CM

## 2021-05-19 MED ORDER — HYDROCODONE-ACETAMINOPHEN 5-325 MG PO TABS: 1.0000 | ORAL_TABLET | Freq: Four times a day (QID) | ORAL | 0 refills | Status: DC | PRN

## 2021-05-19 NOTE — Progress Notes (Signed)
Chief Complaint  Patient presents with   New Patient (Initial Visit)     Well Woman Deyanna Mctier is here for a complete physical.  Here w friend who helps interpret.  Her last physical was >1 year ago.  Current diet: in general, a "healthy" diet. Current exercise: aqua-therapy. Weight is stable and she denies daytime fatigue. Seatbelt? Yes  Health Maintenance Colonoscopy- Yes Shingrix- Yes DEXA- Yes Mammogram- Yes Tetanus- No Pneumonia- Yes Hep C screen- Yes; 10-30-15  Chronic knee, shoulder/back pain For several years the patient has suffered from chronic knee, shoulder, and back pain.  She will sometimes get injections in her shoulder and knees from her rheumatologist who she sees for osteoporosis management. She takes Norco 5-325 mg once per 2 days and Tylenol for pain. Has seen PT in past and does not want to see again. Never saw specialist or had imaging beyond XR lumbar spine.   Past Medical History:  Diagnosis Date   Closed fracture of metatarsal bone(s) 01/30/2013   Essential hypertension    Lumbar radiculopathy    Osteoarthritis    Osteoporosis      Past Surgical History:  Procedure Laterality Date   Cotton Osteotomy w/ Bone Graft Left 04/16/2015   Left foot   IR RADIOLOGIST EVAL & MGMT  04/11/2017   OPEN REDUCTION INTERNAL FIXATION (ORIF) DISTAL PHALANX Left 10/23/2014   5th Metatarsal    Medications  Current Outpatient Medications on File Prior to Visit  Medication Sig Dispense Refill   amLODipine (NORVASC) 10 MG tablet Take 10 mg by mouth.     calcium citrate (CALCITRATE - DOSED IN MG ELEMENTAL CALCIUM) 950 (200 Ca) MG tablet Take 1 tablet by mouth daily.     cyclobenzaprine (FLEXERIL) 10 MG tablet Take 1 tablet (10 mg total) by mouth 3 (three) times daily as needed for muscle spasms. 90 tablet 3   diclofenac sodium (VOLTAREN) 1 % GEL Apply 2 g topically 4 (four) times daily. 100 g 3   gabapentin (NEURONTIN) 100 MG capsule Take 100 mg by mouth daily.      Lutein 6 MG CAPS Take by mouth.     sertraline (ZOLOFT) 50 MG tablet Take 50 mg by mouth daily.     traZODone (DESYREL) 100 MG tablet Take 100 mg by mouth at bedtime.     VITAMIN D, CHOLECALCIFEROL, PO Take 1 capsule by mouth daily.     Allergies No Known Allergies  Review of Systems: Constitutional:  no fevers Eye:  no recent significant change in vision Ears:  No changes in hearing Nose/Mouth/Throat:  no complaints of nasal congestion, no sore throat Cardiovascular: no chest pain Respiratory:  No shortness of breath Gastrointestinal:  No change in bowel habits GU:  Female: negative for dysuria MSK: +jt pain Integumentary:  no abnormal skin lesions reported Neurologic:  no headaches Endocrine:  denies unexplained weight changes  Exam BP 122/72    Pulse 85    Temp 98.1 F (36.7 C) (Oral)    Ht 5\' 1"  (1.549 m)    Wt 136 lb (61.7 kg)    SpO2 96%    BMI 25.70 kg/m  General:  well developed, well nourished, in no apparent distress Skin:  no significant moles, warts, or growths Head:  no masses, lesions, or tenderness Eyes:  pupils equal and round, sclera anicteric without injection Ears:  canals without lesions, TMs shiny without retraction, no obvious effusion, no erythema Nose:  nares patent, septum midline, mucosa normal, and no drainage  or sinus tenderness Throat/Pharynx:  lips and gingiva without lesion; tongue and uvula midline; non-inflamed pharynx; no exudates or postnasal drainage Neck: neck supple without adenopathy, thyromegaly, or masses Lungs:  clear to auscultation, breath sounds equal bilaterally, no respiratory distress Cardio:  regular rate and rhythm, no bruits or LE edema Abdomen:  abdomen soft, nontender; bowel sounds normal; no masses or organomegaly Genital: Deferred MSK: +TTP over lumbar parasp msc b/l, R trap; OK hamstring ROM Neuro:  gait normal;neg straight leg b/l, Spurling's; deep tendon reflexes normal and symmetric Psych: well oriented with normal  range of affect and appropriate judgment/insight  Assessment and Plan  Well adult exam  Essential hypertension - Plan: CBC  Hypertriglyceridemia - Plan: Lipid panel, Comprehensive metabolic panel  Chronic bilateral low back pain, unspecified whether sciatica present - Plan: Ambulatory referral to Physical Medicine Rehab  History of vertebral compression fracture  Lumbar radiculopathy - Plan: Ambulatory referral to Physical Medicine Rehab  Primary osteoarthritis involving multiple joints - Plan: Ambulatory referral to Physical Medicine Rehab, HYDROcodone-acetaminophen (NORCO/VICODIN) 5-325 MG tablet  Depression, major, single episode, in partial remission ([place])  Need for vaccination against Streptococcus pneumoniae - Plan: Pneumococcal conjugate vaccine 20-valent (Prevnar 77)   Well 71 y.o. female. Counseled on diet and exercise. Chronic pain/OA: Chronic, uncontrolled. OK to resume Norco 5-325 mg prn. Do not want her using freq. Refer PM&R. Pt declined PT. Stretches/exercises provided. Heat, ice, Tylenol.  PCV20 today.  Other orders as above. Follow up in 6 mo for med ck or prn. The patient voiced understanding and agreement to the plan.  [place], DO 05/19/21 3:46 PM

## 2021-05-19 NOTE — Patient Instructions (Addendum)
Heat (pad or rice pillow in microwave) over affected area, 10-15 minutes twice daily.   Ice/cold pack over area for 10-15 min twice daily.  Give [place] 2-3 business days to get the results of your labs back.   Keep the diet clean and stay active.  If you do not hear anything about your referral in the next 1-2 weeks, call our office and ask for an update.  Foods that may reduce pain: 1) Ginger 2) Blueberries 3) Salmon 4) Pumpkin seeds 5) dark chocolate 6) turmeric 7) tart cherries 8) virgin olive oil 9) chilli peppers 10) mint 11) red wine  Let us know if you need anything.

## 2021-05-20 LAB — COMPREHENSIVE METABOLIC PANEL
ALT: 6 U/L (ref 0–35)
AST: 16 U/L (ref 0–37)
Albumin: 4.6 g/dL (ref 3.5–5.2)
Alkaline Phosphatase: 46 U/L (ref 39–117)
BUN: 15 mg/dL (ref 6–23)
CO2: 28 mEq/L (ref 19–32)
Calcium: 8.8 mg/dL (ref 8.4–10.5)
Chloride: 105 mEq/L (ref 96–112)
Creatinine, Ser: 0.72 mg/dL (ref 0.40–1.20)
GFR: 85.17 mL/min (ref >60.00)
Glucose, Bld: 95 mg/dL (ref 70–99)
Potassium: 4.2 mEq/L (ref 3.5–5.1)
Sodium: 143 mEq/L (ref 135–145)
Total Bilirubin: 0.5 mg/dL (ref 0.2–1.2)
Total Protein: 7 g/dL (ref 6.0–8.3)

## 2021-05-20 LAB — LIPID PANEL
Cholesterol: 176 mg/dL (ref 0–200)
HDL: 55.2 mg/dL (ref >39.00)
LDL Cholesterol: 81 mg/dL (ref 0–99)
NonHDL: 120.3
Total CHOL/HDL Ratio: 3
Triglycerides: 199 mg/dL — ABNORMAL HIGH (ref 0.0–149.0)
VLDL: 39.8 mg/dL (ref 0.0–40.0)

## 2021-05-20 LAB — CBC
HCT: 38.1 % (ref 36.0–46.0)
Hemoglobin: 12.5 g/dL (ref 12.0–15.0)
MCHC: 32.7 g/dL (ref 30.0–36.0)
MCV: 92.4 fl (ref 78.0–100.0)
Platelets: 270 10*3/uL (ref 150.0–400.0)
RBC: 4.13 Mil/uL (ref 3.87–5.11)
RDW: 13 % (ref 11.5–15.5)
WBC: 6.3 10*3/uL (ref 4.0–10.5)

## 2021-05-21 ENCOUNTER — Other Ambulatory Visit: Payer: Self-pay | Admitting: Family Medicine

## 2021-05-21 DIAGNOSIS — E781 Pure hyperglyceridemia: Secondary | ICD-10-CM

## 2021-06-09 ENCOUNTER — Encounter: Payer: Self-pay | Admitting: Physical Medicine & Rehabilitation

## 2021-07-02 ENCOUNTER — Other Ambulatory Visit (INDEPENDENT_AMBULATORY_CARE_PROVIDER_SITE_OTHER): Payer: Medicare Other

## 2021-07-02 DIAGNOSIS — E781 Pure hyperglyceridemia: Secondary | ICD-10-CM

## 2021-07-02 LAB — LIPID PANEL
Cholesterol: 182 mg/dL (ref 0–200)
HDL: 58 mg/dL (ref >39.00)
LDL Cholesterol: 104 mg/dL — ABNORMAL HIGH (ref 0–99)
NonHDL: 124.37
Total CHOL/HDL Ratio: 3
Triglycerides: 102 mg/dL (ref 0.0–149.0)
VLDL: 20.4 mg/dL (ref 0.0–40.0)

## 2021-07-13 ENCOUNTER — Ambulatory Visit (INDEPENDENT_AMBULATORY_CARE_PROVIDER_SITE_OTHER): Payer: Medicare Other | Admitting: Family Medicine

## 2021-07-13 ENCOUNTER — Encounter: Payer: Self-pay | Admitting: Family Medicine

## 2021-07-13 ENCOUNTER — Encounter: Payer: Self-pay | Admitting: *Deleted

## 2021-07-13 VITALS — BP 130/72 | HR 82 | Temp 98.3°F | Resp 16 | Ht <= 58 in | Wt 139.0 lb

## 2021-07-13 DIAGNOSIS — M159 Polyosteoarthritis, unspecified: Secondary | ICD-10-CM | POA: Diagnosis not present

## 2021-07-13 DIAGNOSIS — M25561 Pain in right knee: Secondary | ICD-10-CM | POA: Diagnosis not present

## 2021-07-13 DIAGNOSIS — G8929 Other chronic pain: Secondary | ICD-10-CM | POA: Diagnosis not present

## 2021-07-13 DIAGNOSIS — S46811D Strain of other muscles, fascia and tendons at shoulder and upper arm level, right arm, subsequent encounter: Secondary | ICD-10-CM

## 2021-07-13 DIAGNOSIS — M545 Low back pain, unspecified: Secondary | ICD-10-CM

## 2021-07-13 DIAGNOSIS — M25562 Pain in left knee: Secondary | ICD-10-CM

## 2021-07-13 DIAGNOSIS — S46811A Strain of other muscles, fascia and tendons at shoulder and upper arm level, right arm, initial encounter: Secondary | ICD-10-CM | POA: Insufficient documentation

## 2021-07-13 MED ORDER — METHYLPREDNISOLONE ACETATE 40 MG/ML IJ SUSP
40.0000 mg | Freq: Once | INTRAMUSCULAR | Status: AC
  Administered 2021-07-13: 40 mg via INTRA_ARTICULAR

## 2021-07-13 MED ORDER — HYDROCODONE-ACETAMINOPHEN 5-325 MG PO TABS: 1.0000 | ORAL_TABLET | Freq: Four times a day (QID) | ORAL | 0 refills | Status: DC | PRN

## 2021-07-13 NOTE — Patient Instructions (Addendum)
Ice/cold pack over area for 10-15 min twice daily. ? ?Heat (pad or rice pillow in microwave) over affected area, 10-15 minutes twice daily.  ? ?If you do not hear anything about your referral in the next 1-2 weeks, call our office and ask for an update. ? ?Let us know if you need anything. ? ?Trapezius stretches/exercises ?Do exercises exactly as told by your health care provider and adjust them as directed. It is normal to feel mild stretching, pulling, tightness, or discomfort as you do these exercises, but you should stop right away if you feel sudden pain or your pain gets worse.  ? ?Stretching and range of motion exercises ?These exercises warm up your muscles and joints and improve the movement and flexibility of your shoulder. These exercises can also help to relieve pain, numbness, and tingling. If you are unable to do any of the following for any reason, do not further attempt to do it.  ? ?Exercise A: Flexion, standing  ? ?  ?Stand and hold a broomstick, a cane, or a similar object. Place your hands a little more than shoulder-width apart on the object. Your left / right hand should be palm-up, and your other hand should be palm-down. ?Push the stick to raise your left / right arm out to your side and then over your head. Use your other hand to help move the stick. Stop when you feel a stretch in your shoulder, or when you reach the angle that is recommended by your health care provider. ?Avoid shrugging your shoulder while you raise your arm. Keep your shoulder blade tucked down toward your spine. ?Hold for 30 seconds. ?Slowly return to the starting position. ?Repeat 2 times. Complete this exercise 3 times per week. ? ?Exercise B: Abduction, supine  ? ?  ?Lie on your back and hold a broomstick, a cane, or a similar object. Place your hands a little more than shoulder-width apart on the object. Your left / right hand should be palm-up, and your other hand should be palm-down. ?Push the stick to raise your  left / right arm out to your side and then over your head. Use your other hand to help move the stick. Stop when you feel a stretch in your shoulder, or when you reach the angle that is recommended by your health care provider. ?Avoid shrugging your shoulder while you raise your arm. Keep your shoulder blade tucked down toward your spine. ?Hold for 30 seconds. ?Slowly return to the starting position. ?Repeat 2 times. Complete this exercise 3 times per week. ? ?Exercise C: Flexion, active-assisted  ? ?  ?Lie on your back. You may bend your knees for comfort. ?Hold a broomstick, a cane, or a similar object. Place your hands about shoulder-width apart on the object. Your palms should face toward your feet. ?Raise the stick and move your arms over your head and behind your head, toward the floor. Use your healthy arm to help your left / right arm move farther. Stop when you feel a gentle stretch in your shoulder, or when you reach the angle where your health care provider tells you to stop. ?Hold for 30 seconds. ?Slowly return to the starting position. ?Repeat 2 times. Complete this exercise 3 times per week. ? ?Exercise D: External rotation and abduction  ? ?  ?Stand in a door frame with one of your feet slightly in front of the other. This is called a staggered stance. ?Choose one of the following positions as told  by your health care provider: ?Place your hands and forearms on the door frame above your head. ?Place your hands and forearms on the door frame at the height of your head. ?Place your hands on the door frame at the height of your elbows. ?Slowly move your weight onto your front foot until you feel a stretch across your chest and in the front of your shoulders. Keep your head and chest upright and keep your abdominal muscles tight. ?Hold for 30 seconds. ?To release the stretch, shift your weight to your back foot. ?Repeat 2 times. Complete this stretch 3 times per week. ? ?Strengthening exercises ?These  exercises build strength and endurance in your shoulder. Endurance is the ability to use your muscles for a long time, even after your muscles get tired. ?Exercise E: Scapular depression and adduction  ?Sit on a stable chair. Support your arms in front of you with pillows, armrests, or a tabletop. Keep your elbows in line with the sides of your body. ?Gently move your shoulder blades down toward your middle back. Relax the muscles on the tops of your shoulders and in the back of your neck. ?Hold for 3 seconds. ?Slowly release the tension and relax your muscles completely before doing this exercise again. Repeat for a total of 10 repetitions. ?After you have practiced this exercise, try doing the exercise without the arm support. Then, try the exercise while standing instead of sitting. ?Repeat 2 times. Complete this exercise 3 times per week. ? ?Exercise F: Shoulder abduction, isometric  ? ?  ?Stand or sit about 4-6 inches (10-15 cm) from a wall with your left / right side facing the wall. ?Bend your left / right elbow and gently press your elbow against the wall. ?Increase the pressure slowly until you are pressing as hard as you can without shrugging your shoulder. ?Hold for 3 seconds. ?Slowly release the tension and relax your muscles completely. Repeat for a total of 10 repetitions. ?Repeat 2 times. Complete this exercise 3 times per week. ? ?Exercise G: Shoulder flexion, isometric  ? ?  ?Stand or sit about 4-6 inches (10-15 cm) away from a wall with your left / right side facing the wall. ?Keep your left / right elbow straight and gently press the top of your fist against the wall. Increase the pressure slowly until you are pressing as hard as you can without shrugging your shoulder. ?Hold for 10-15 seconds. ?Slowly release the tension and relax your muscles completely. Repeat for a total of 10 repetitions. ?Repeat 2 times. Complete this exercise 3 times per week. ? ?Exercise H: Internal rotation  ? ?  ?Sit in  a stable chair without armrests, or stand. Secure an exercise band at your left / right side, at elbow height. ?Place a soft object, such as a folded towel or a small pillow, under your left / right upper arm so your elbow is a few inches (about 8 cm) away from your side. ?Hold the end of the exercise band so the band stretches. ?Keeping your elbow pressed against the soft object under your arm, move your forearm across your body toward your abdomen. Keep your body steady so the movement is only coming from your shoulder. ?Hold for 3 seconds. ?Slowly return to the starting position. Repeat for a total of 10 repetitions. ?Repeat 2 times. Complete this exercise 3 times per week. ? ?Exercise I: External rotation  ? ?  ?Sit in a stable chair without armrests, or stand. ?Secure an  exercise band at your left / right side, at elbow height. ?Place a soft object, such as a folded towel or a small pillow, under your left / right upper arm so your elbow is a few inches (about 8 cm) away from your side. ?Hold the end of the exercise band so the band stretches. ?Keeping your elbow pressed against the soft object under your arm, move your forearm out, away from your abdomen. Keep your body steady so the movement is only coming from your shoulder. ?Hold for 3 seconds. ?Slowly return to the starting position. Repeat for a total of 10 repetitions. ?Repeat 2 times. Complete this exercise 3 times per week. ?Exercise J: Shoulder extension  ?Sit in a stable chair without armrests, or stand. Secure an exercise band to a stable object in front of you so the band is at shoulder height. ?Hold one end of the exercise band in each hand. Your palms should face each other. ?Straighten your elbows and lift your hands up to shoulder height. ?Step back, away from the secured end of the exercise band, until the band stretches. ?Squeeze your shoulder blades together and pull your hands down to the sides of your thighs. Stop when your hands are  straight down by your sides. Do not let your hands go behind your body. ?Hold for 3 seconds. ?Slowly return to the starting position. Repeat for a total of 10 repetitions. ?Repeat 2 times. Complete this exerc

## 2021-07-13 NOTE — Progress Notes (Signed)
Chief Complaint  ?Patient presents with  ? Pain Management  ?  Here for pain Management for Knee and back   ? ? ?Subjective: ?Patient is a 71 y.o. female here for fu pain. Here w friend who helps interpret.  ? ?B/l knees- swelling, pain, popping. Radiates down to ankle. No locking.  Denies any recent injury or change in activity.  She had injections in the past which were helpful for several months.  This was around 7 years ago.  She is interested in seeing a specialist. ? ?B/l low back, R shoulder/neck area.  She declines physical therapy.  She has an appointment with physical medicine rehabilitation and 1 month.  No new neurologic signs or symptoms.  No recent injury or change in activity. ? ?Past Medical History:  ?Diagnosis Date  ? Closed fracture of metatarsal bone(s) 01/30/2013  ? Essential hypertension   ? Lumbar radiculopathy   ? Osteoarthritis   ? Osteoporosis   ? ? ?Objective: ?BP 130/72 (BP Location: Right Arm, Patient Position: Sitting, Cuff Size: Normal)   Pulse 82   Temp 98.3 ?F (36.8 ?C) (Oral)   Resp 16   Ht '4\' 9"'$  (1.448 m)   Wt 139 lb (63 kg)   SpO2 98%   BMI 30.08 kg/m?  ?General: Awake, appears stated age ?MSK: TTP over the lumbar paraspinal musculature, worse on the right, TTP over the right trapezius, no tenderness over the bilateral knees, no gross deformity of the knees, no effusion or soft tissue swelling appreciated; negative Lachman's, Stines, varus/valgus stress, patellar apprehension/grind, or joint line tenderness ?Lungs:  No accessory muscle use ?Psych: Age appropriate judgment and insight, normal affect and mood ? ?Procedure Note; Knee injection ?Verbal consent obtained. ?The area of the antero-lateral joint line was palpated and cleaned with alcohol x1. ?A 25-gauge needle was used to enter the joint space anterolaterally with ease. ?40 mg of Depomedrol with 2 mL of 1% lidocaine was injected. ?A bandage was placed. ?This process was repeated on the contralateral side. ?The  patient tolerated the procedure well. ?There were no complications noted. ? ?Assessment and Plan: ?Chronic bilateral low back pain, unspecified whether sciatica present ? ?Primary osteoarthritis involving multiple joints - Plan: HYDROcodone-acetaminophen (NORCO/VICODIN) 5-325 MG tablet ? ?Chronic pain of both knees - Plan: Ambulatory referral to Orthopedic Surgery, methylPREDNISolone acetate (DEPO-MEDROL) injection 40 mg, methylPREDNISolone acetate (DEPO-MEDROL) injection 40 mg, ? ?Strain of right trapezius muscle, subsequent encounter ? ?Chronic, not controlled.  Continue Norco as needed.  Seeing physical medicine and rehabilitation next month.  Could consider changing sertraline to Cymbalta for possibly tramadol. ?As above. ?Chronic, not controlled.  Bilateral steroid injections today.  Refer to Ortho. ?Chronic, not controlled.  Stretches and exercises provided.  Heat, ice, Tylenol.  Consider trigger point injection if no improvement.  We will see what the physical medicine and rehabilitation team says. ?The patient and her friend voiced understanding and agreement to the plan. ? ?Shelda Pal, DO ?07/13/21  ?4:31 PM ? ? ? ? ?

## 2021-07-16 ENCOUNTER — Ambulatory Visit (HOSPITAL_BASED_OUTPATIENT_CLINIC_OR_DEPARTMENT_OTHER): Payer: Medicare Other | Admitting: Orthopaedic Surgery

## 2021-07-16 ENCOUNTER — Ambulatory Visit: Payer: Medicare Other | Admitting: Physician Assistant

## 2021-07-27 ENCOUNTER — Ambulatory Visit: Payer: Self-pay

## 2021-07-27 ENCOUNTER — Ambulatory Visit (INDEPENDENT_AMBULATORY_CARE_PROVIDER_SITE_OTHER): Payer: Medicare Other | Admitting: Orthopaedic Surgery

## 2021-07-27 ENCOUNTER — Ambulatory Visit (INDEPENDENT_AMBULATORY_CARE_PROVIDER_SITE_OTHER): Payer: Medicare Other

## 2021-07-27 ENCOUNTER — Encounter: Payer: Self-pay | Admitting: Orthopaedic Surgery

## 2021-07-27 DIAGNOSIS — M1711 Unilateral primary osteoarthritis, right knee: Secondary | ICD-10-CM

## 2021-07-27 DIAGNOSIS — M1712 Unilateral primary osteoarthritis, left knee: Secondary | ICD-10-CM

## 2021-07-27 DIAGNOSIS — M79672 Pain in left foot: Secondary | ICD-10-CM

## 2021-07-27 NOTE — Progress Notes (Signed)
? ?Office Visit Note ?  ?Patient: Amber Mccall           ?Date of Birth: 1950/11/19           ?MRN: [id number] ?Visit Date: 07/27/2021 ?             ?Requested by: Shelda Pal, DO ?[facility] [address] ?[place],  [place] [address] ?PCP: Shelda Pal, DO ? ? ?Assessment & Plan: ?Visit Diagnoses:  ?1. Primary osteoarthritis of right knee   ?2. Primary osteoarthritis of left knee   ?3. Pain in left foot   ? ? ?Plan: Impression is bilateral knee degenerative joint disease.  She is no longer experiencing any relief from cortisone injections.  Based on our extensive discussion on treatment options she has elected to move forward with a right total knee replacement in the near future.  She had a cortisone injection 2 weeks ago so surgery will have to be delayed at least 2 months.  She is asking for estimate for cost related to the surgery.  We will provide her with an estimate to the best of my ability.  Denies nickel allergy or history of DVT.  Left knee is doing okay and does not need any intervention.  She requests a referral to Dr. Sharol Given for evaluation of left foot pain.  Risk benefits prognosis of the knee replacement reviewed with the patient.  Language barrier added to the complexity of visit today. ? ?Follow-Up Instructions: No follow-ups on file.  ? ?Orders:  ?Orders Placed This Encounter  ?Procedures  ? XR KNEE 3 VIEW LEFT  ? XR KNEE 3 VIEW RIGHT  ? XR Foot Complete Left  ? Ambulatory referral to Orthopedic Surgery  ? ?No orders of the defined types were placed in this encounter. ? ? ? ? Procedures: ?No procedures performed ? ? ?Clinical Data: ?No additional findings. ? ? ?Subjective: ?Chief Complaint  ?Patient presents with  ? Right Knee - Pain  ? Left Knee - Pain  ? ? ?HPI ? ?Amber Mccall is a 71 year old female here with family member to help with translation comes in for bilateral knee pain that started years ago.  She feels a clicking sensation behind the right patella with use of stairs.   She had a cortisone injection about 2 weeks ago which has provided partial relief but this relief is not as good as when she had the injection some years ago.  She reports muscle spasms at night.  She is having difficulty with ADLs and nighttime pain.  She is fairly limited from a functional standpoint by her knees especially her right. ? ?Review of Systems  ?Constitutional: Negative.   ?HENT: Negative.    ?Eyes: Negative.   ?Respiratory: Negative.    ?Cardiovascular: Negative.   ?Endocrine: Negative.   ?Musculoskeletal: Negative.   ?Neurological: Negative.   ?Hematological: Negative.   ?Psychiatric/Behavioral: Negative.    ?All other systems reviewed and are negative. ? ? ?Objective: ?Vital Signs: There were no vitals taken for this visit. ? ?Physical Exam ?Vitals and nursing note reviewed.  ?Constitutional:   ?   Appearance: She is well-developed.  ?HENT:  ?   Head: Normocephalic and atraumatic.  ?Pulmonary:  ?   Effort: Pulmonary effort is normal.  ?Abdominal:  ?   Palpations: Abdomen is soft.  ?Musculoskeletal:  ?   Cervical back: Neck supple.  ?Skin: ?   General: Skin is warm.  ?   Capillary Refill: Capillary refill takes less  than 2 seconds.  ?Neurological:  ?   Mental Status: She is alert and oriented to person, place, and time.  ?Psychiatric:     ?   Behavior: Behavior normal.     ?   Thought Content: Thought content normal.     ?   Judgment: Judgment normal.  ? ? ?Ortho Exam ? ?Examination of the right knee shows crepitus with range of motion.  Collaterals and cruciates are stable.  Trace effusion.  No jointline tenderness.  Normal alignment. ? ?Examination left knee shows minimal crepitus with range of motion.  Collaterals and cruciates are stable.  No joint effusion.  No joint line tenderness.  Normal range of motion and alignment. ? ?Specialty Comments:  ?No specialty comments available. ? ?Imaging: ?XR Foot Complete Left ? ?Result Date: 07/27/2021 ?Generalized osteopenia.  There is a screw in the fifth  metatarsal from prior surgery.  No acute abnormalities.  I do not see any complications related to the screw. ? ?XR KNEE 3 VIEW LEFT ? ?Result Date: 07/27/2021 ?Mild to moderate tricompartmental OA. ? ?XR KNEE 3 VIEW RIGHT ? ?Result Date: 07/27/2021 ?Moderately severe tricompartmental OA.  ? ? ?PMFS History: ?Patient Active Problem List  ? Diagnosis Date Noted  ? Primary osteoarthritis of left knee 07/27/2021  ? Primary osteoarthritis involving multiple joints 07/13/2021  ? Strain of right trapezius muscle 07/13/2021  ? Essential hypertension 05/19/2021  ? Hypertriglyceridemia 05/19/2021  ? Osteoporosis 05/19/2021  ? Lumbar radiculopathy 05/19/2021  ? Osteoarthritis 05/19/2021  ? Depression, major, single episode, in partial remission ([place]) 05/19/2021  ? Chronic bilateral low back pain 05/19/2021  ? Status post left foot surgery 04/23/2015  ? Injury of foot, left 04/08/2015  ? Sprain of ankle 04/08/2015  ? Deformity of metatarsal bone of left foot 04/08/2015  ? Metatarsalgia of left foot 02/10/2015  ? Tenosynovitis of foot 02/10/2015  ? Chronic pain of both knees 02/10/2015  ? Closed fracture of metatarsal bone 10/21/2014  ? Edema of left foot 10/21/2014  ? Closed fracture of metatarsal bone(s) 01/30/2013  ? Pain, foot 01/30/2013  ? Abnormal foot finding 01/30/2013  ? ?Past Medical History:  ?Diagnosis Date  ? Closed fracture of metatarsal bone(s) 01/30/2013  ? Essential hypertension   ? Lumbar radiculopathy   ? Osteoarthritis   ? Osteoporosis   ?  ?No family history on file.  ?Past Surgical History:  ?Procedure Laterality Date  ? Cotton Osteotomy w/ Bone Graft Left 04/16/2015  ? Left foot  ? IR RADIOLOGIST EVAL & MGMT  04/11/2017  ? OPEN REDUCTION INTERNAL FIXATION (ORIF) DISTAL PHALANX Left 10/23/2014  ? 5th Metatarsal  ? ?Social History  ? ?Occupational History  ? Not on file  ?Tobacco Use  ? Smoking status: Never  ? Smokeless tobacco: Never  ?Substance and Sexual Activity  ? Alcohol use: Not on file  ? Drug use: Not  on file  ? Sexual activity: Not on file  ? ? ? ? ? ? ?

## 2021-08-09 ENCOUNTER — Encounter: Payer: Self-pay | Admitting: Orthopedic Surgery

## 2021-08-09 ENCOUNTER — Ambulatory Visit (INDEPENDENT_AMBULATORY_CARE_PROVIDER_SITE_OTHER): Payer: Medicare Other | Admitting: Orthopedic Surgery

## 2021-08-09 DIAGNOSIS — M21372 Foot drop, left foot: Secondary | ICD-10-CM | POA: Diagnosis not present

## 2021-08-09 DIAGNOSIS — M62572 Muscle wasting and atrophy, not elsewhere classified, left ankle and foot: Secondary | ICD-10-CM | POA: Diagnosis not present

## 2021-08-09 NOTE — Progress Notes (Signed)
? ?Office Visit Note ?  ?Patient: Amber Mccall           ?Date of Birth: 10/23/1950           ?MRN: [id number] ?Visit Date: 08/09/2021 ?             ?Requested by: Leandrew Koyanagi, MD ?[address] ?[place],  [place] [address] ?PCP: Shelda Pal, DO ? ?Chief Complaint  ?Patient presents with  ? Left Foot - Pain  ? ? ? ? ?HPI: ?Patient is a 71 year old woman who is seen in referral from Dr. Erlinda Hong for evaluation of her left foot.  Patient is going to undergo total knee arthroplasty and there is a concern with stability of the left foot. ? ?Patient does undergo Prolia therapy every 6 months. ? ?Assessment & Plan: ?Visit Diagnoses:  ?1. Foot drop, left   ?2. Atrophy of muscle of left foot   ? ? ?Plan: We will obtain an MRI scan of the left ankle to evaluate the peroneal tendons.  I feel that the peroneal tendon weakness, foot atrophy and decreased range of motion of her toes is most likely secondary to her back trauma.  Will reevaluate after the MRI scan is obtained. ? ?Follow-Up Instructions: Return in about 2 weeks (around 08/23/2021).  ? ?Ortho Exam ? ?Patient is alert, oriented, no adenopathy, well-dressed, normal affect, normal respiratory effort. ?Examination patient has a negative straight leg raise on the left.  She is status post multiple back surgeries 30 years ago falling down a flight of stairs.  She currently wears a pneumatic back brace.  Her left foot is thin and atrophic compared to the right foot.  She does not have peroneal tendon motor function on the left compared to the right.  With dorsiflexion she supinates the foot on the left secondary to intact anterior tibial tendon function.  Peroneal tendon is intact bilaterally.  Radiographs show previous internal fixation ? ?Imaging: ?No results found. ?No images are attached to the encounter. ? ?Labs: ?No results found for: HGBA1C, ESRSEDRATE, CRP, LABURIC, REPTSTATUS, GRAMSTAIN, CULT, LABORGA ? ? ?Lab Results  ?Component Value Date  ? ALBUMIN 4.6  05/19/2021  ? ? ?No results found for: MG ?No results found for: VD25OH ? ?No results found for: PREALBUMIN ? ?  Latest Ref Rng & Units 05/19/2021  ?  2:20 PM  ?CBC EXTENDED  ?WBC 4.0 - 10.5 K/uL 6.3    ?RBC 3.87 - 5.11 Mil/uL 4.13    ?Hemoglobin 12.0 - 15.0 g/dL 12.5    ?HCT 36.0 - 46.0 % 38.1    ?Platelets 150.0 - 400.0 K/uL 270.0    ? ? ? ?There is no height or weight on file to calculate BMI. ? ?Orders:  ?No orders of the defined types were placed in this encounter. ? ?No orders of the defined types were placed in this encounter. ? ? ? Procedures: ?No procedures performed ? ?Clinical Data: ?No additional findings. ? ?ROS: ? ?All other systems negative, except as noted in the HPI. ?Review of Systems ? ?Objective: ?Vital Signs: There were no vitals taken for this visit. ? ?Specialty Comments:  ?No specialty comments available. ? ?PMFS History: ?Patient Active Problem List  ? Diagnosis Date Noted  ? Primary osteoarthritis of left knee 07/27/2021  ? Primary osteoarthritis involving multiple joints 07/13/2021  ? Strain of right trapezius muscle 07/13/2021  ? Essential hypertension 05/19/2021  ? Hypertriglyceridemia 05/19/2021  ? Osteoporosis 05/19/2021  ? Lumbar radiculopathy 05/19/2021  ?  Osteoarthritis 05/19/2021  ? Depression, major, single episode, in partial remission ([place]) 05/19/2021  ? Chronic bilateral low back pain 05/19/2021  ? Status post left foot surgery 04/23/2015  ? Injury of foot, left 04/08/2015  ? Sprain of ankle 04/08/2015  ? Deformity of metatarsal bone of left foot 04/08/2015  ? Metatarsalgia of left foot 02/10/2015  ? Tenosynovitis of foot 02/10/2015  ? Chronic pain of both knees 02/10/2015  ? Closed fracture of metatarsal bone 10/21/2014  ? Edema of left foot 10/21/2014  ? Closed fracture of metatarsal bone(s) 01/30/2013  ? Pain, foot 01/30/2013  ? Abnormal foot finding 01/30/2013  ? ?Past Medical History:  ?Diagnosis Date  ? Closed fracture of metatarsal bone(s) 01/30/2013  ? Essential  hypertension   ? Lumbar radiculopathy   ? Osteoarthritis   ? Osteoporosis   ?  ?History reviewed. No pertinent family history.  ?Past Surgical History:  ?Procedure Laterality Date  ? Cotton Osteotomy w/ Bone Graft Left 04/16/2015  ? Left foot  ? IR RADIOLOGIST EVAL & MGMT  04/11/2017  ? OPEN REDUCTION INTERNAL FIXATION (ORIF) DISTAL PHALANX Left 10/23/2014  ? 5th Metatarsal  ? ?Social History  ? ?Occupational History  ? Not on file  ?Tobacco Use  ? Smoking status: Never  ? Smokeless tobacco: Never  ?Substance and Sexual Activity  ? Alcohol use: Not on file  ? Drug use: Not on file  ? Sexual activity: Not on file  ? ? ? ? ? ?

## 2021-08-11 ENCOUNTER — Ambulatory Visit (HOSPITAL_BASED_OUTPATIENT_CLINIC_OR_DEPARTMENT_OTHER)
Admission: RE | Admit: 2021-08-11 | Discharge: 2021-08-11 | Disposition: A | Discharge status: Home / Self Care | Payer: Medicare Other | Source: Ambulatory Visit | Attending: Orthopedic Surgery | Admitting: Orthopedic Surgery

## 2021-08-11 DIAGNOSIS — M21372 Foot drop, left foot: Secondary | ICD-10-CM | POA: Insufficient documentation

## 2021-08-11 DIAGNOSIS — M67472 Ganglion, left ankle and foot: Secondary | ICD-10-CM | POA: Diagnosis not present

## 2021-08-11 DIAGNOSIS — S93492A Sprain of other ligament of left ankle, initial encounter: Secondary | ICD-10-CM | POA: Diagnosis not present

## 2021-08-11 DIAGNOSIS — M62572 Muscle wasting and atrophy, not elsewhere classified, left ankle and foot: Secondary | ICD-10-CM | POA: Insufficient documentation

## 2021-08-11 IMAGING — MR MR ANKLE*L* W/O CM
5 series · 40 of 40 positions shown · non-contrast
Comparison: X-ray foot [DATE].

CLINICAL DATA: Left lateral ankle and foot pain related to a fall 4
days ago.

EXAM:
MRI OF THE LEFT ANKLE WITHOUT CONTRAST
TECHNIQUE: Multiplanar, multisequence MR imaging of the ankle was performed. No
intravenous contrast was administered.

[Series 5: T2 fat-sat · axial · 3.0mm · 0.70mm/px · z∈[-30,+99]mm · 8 of 40 slices shown (1 of 2)]
[im 1/40]
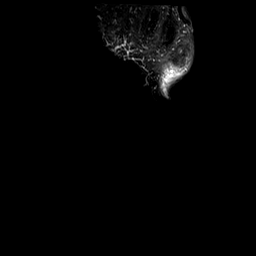
[im 6/40]
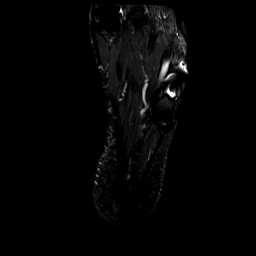
[im 12/40]
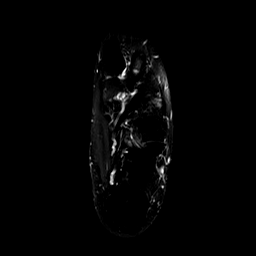
[im 17/40]
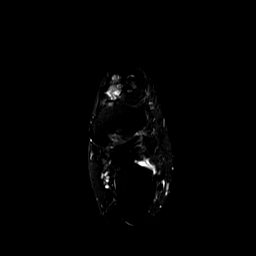
[im 23/40]
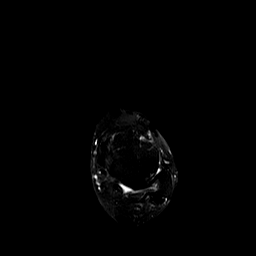
[im 28/40]
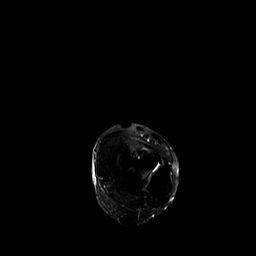
[im 34/40]
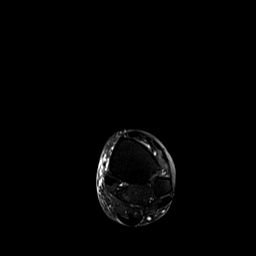
[im 40/40]
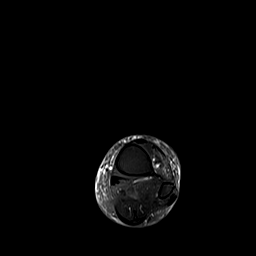

[Series 6: T1 · axial · 3.0mm · 0.70mm/px · z∈[-31,+104]mm · 10 of 42 slices shown (1 of 2)]
[im 1/42]
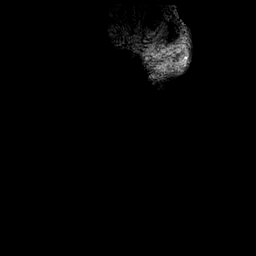
[im 5/42]
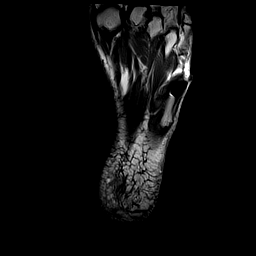
[im 10/42]
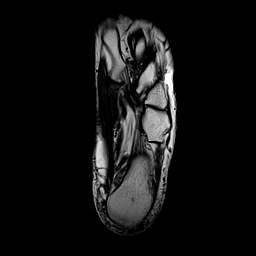
[im 14/42]
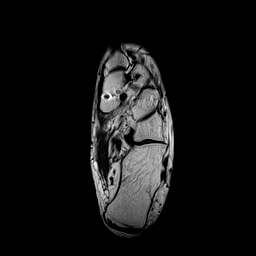
[im 19/42]
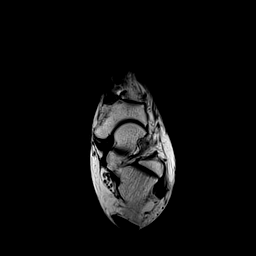
[im 23/42]
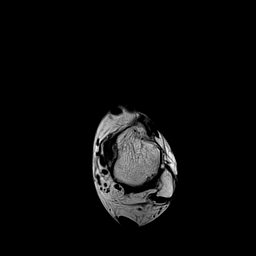
[im 28/42]
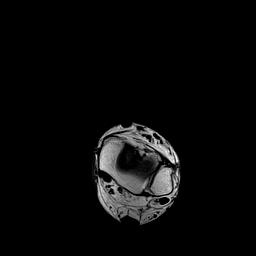
[im 32/42]
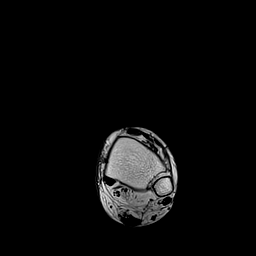
[im 37/42]
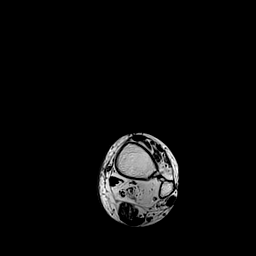
[im 42/42]
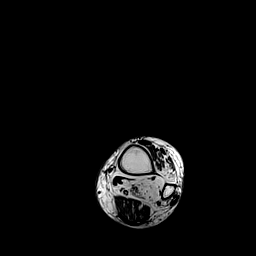

[Series 7: T2 fat-sat · coronal · 3.0mm · 0.62mm/px · 10 of 43 slices shown (2 of 2)]
[im 1/43]
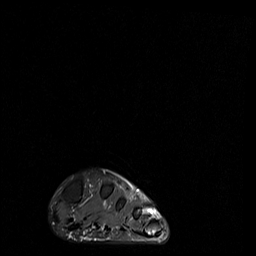
[im 5/43]
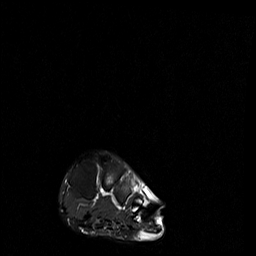
[im 10/43]
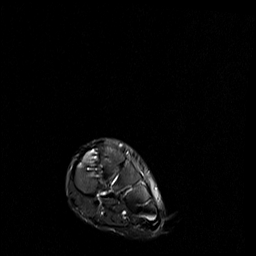
[im 15/43]
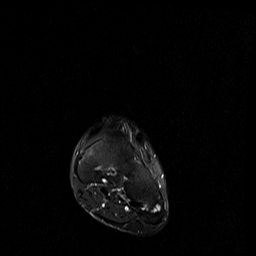
[im 19/43]
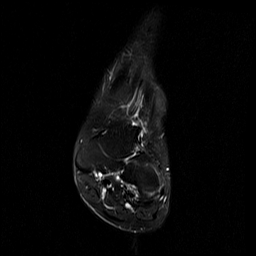
[im 24/43]
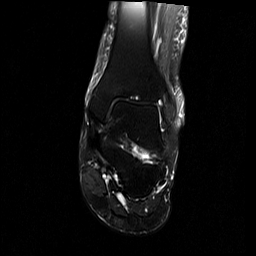
[im 29/43]
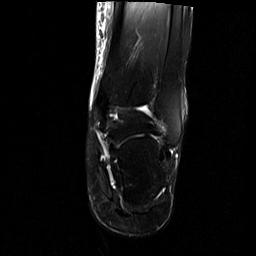
[im 33/43]
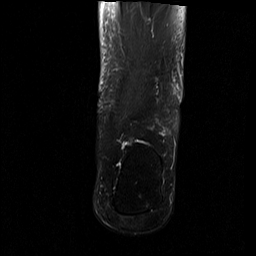
[im 38/43]
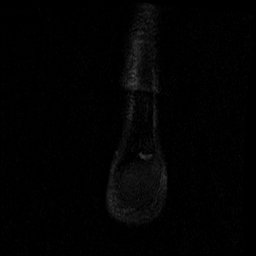
[im 43/43]
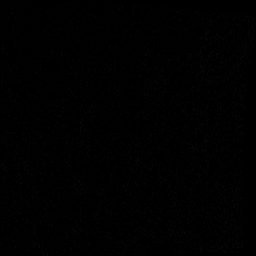

[Series 8: T1 · sagittal · 3.0mm · 0.56mm/px · 6 of 24 slices shown (2 of 2)]
[im 1/24]
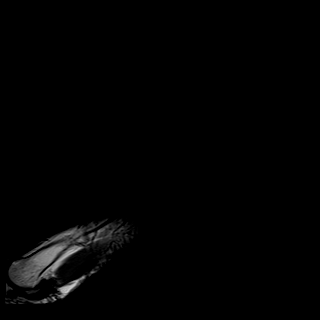
[im 5/24]
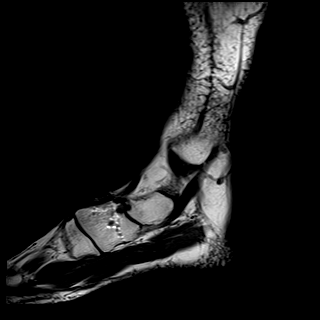
[im 10/24]
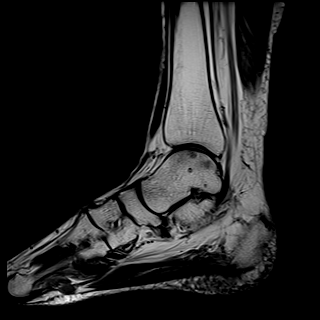
[im 14/24]
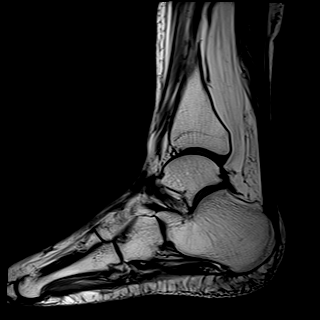
[im 19/24]
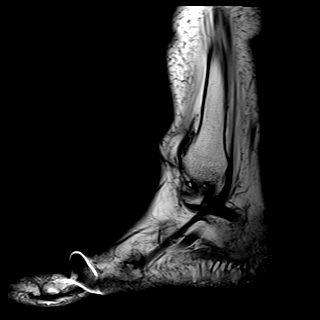
[im 24/24]
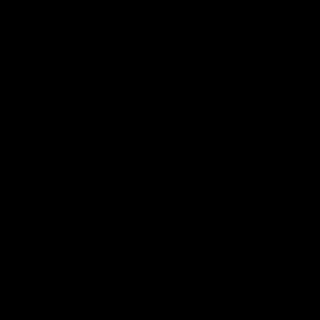

[Series 9: STIR · sagittal · 3.0mm · 0.70mm/px · 6 of 24 slices shown]
[im 1/24]
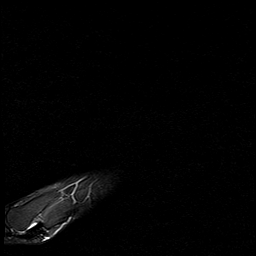
[im 5/24]
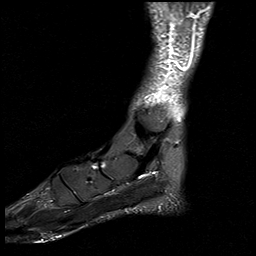
[im 10/24]
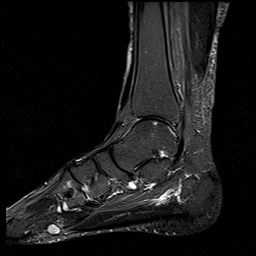
[im 14/24]
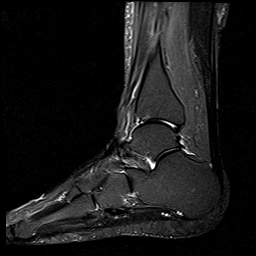
[im 19/24]
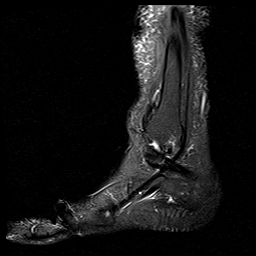
[im 24/24]
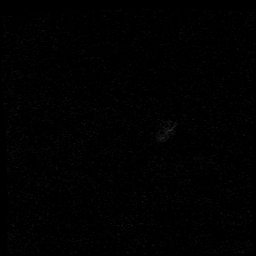

[40 of 40 positions shown; findings below may reference images not displayed]

FINDINGS: TENDONS

Peroneal: Intact peroneus longus and peroneus brevis tendons.

Posteromedial: Intact tibialis posterior, flexor hallucis longus and
flexor digitorum longus tendons.

Anterior: Intact tibialis anterior, extensor hallucis longus and
extensor digitorum longus tendons.

Achilles: Intact.

Plantar Fascia: Intact.

LIGAMENTS

Lateral: The ATFL appears thin, likely representing a chronic
sprain. Calcaneofibular ligament intact. Posterior talofibular
ligament intact. Anterior and posterior tibiofibular ligaments
intact.

Medial: Deltoid ligament intact. Spring ligament intact.

CARTILAGE

Ankle Joint: No joint effusion.  Mild tibiotalar osteoarthritis.

Subtalar Joints/Sinus Tarsi: Normal subtalar joints. No subtalar
joint effusion. Normal sinus tarsi.

Bones: Prior screw fixation of the fifth metatarsal shaft. Prior
medial cuneiform osteotomy. There is linear increased T2 and low T1
signal longitudinally oriented with at the base of the second
metatarsal likely representing a vascular remnant. There is no
evidence of acute fracture. There is mild navicular-medial cuneiform
osteoarthritis.

Soft Tissue: There is a ganglion cyst along the central plantar
forefoot measuring 0.8 x 0.5 x 0.9 cm (series 7, image 41, sagittal
stir image 10).
IMPRESSION: No acute tendon tear or significant tenosynovitis. No acute osseous
abnormality.

Chronic ATFL sprain.  Mild tibiotalar osteoarthritis.

Prior screw fixation of the fifth metatarsal shaft. Prior medial
cuneiform osteotomy.

Ganglion cyst along the central plantar forefoot measuring 0.8 x
x 0.9 cm.

## 2021-08-13 ENCOUNTER — Encounter: Payer: Self-pay | Admitting: Physical Medicine & Rehabilitation

## 2021-08-13 ENCOUNTER — Encounter: Payer: Medicare Other | Attending: Physical Medicine & Rehabilitation | Admitting: Physical Medicine & Rehabilitation

## 2021-08-13 VITALS — BP 114/79 | HR 77 | Ht <= 58 in | Wt 139.0 lb

## 2021-08-13 DIAGNOSIS — M5440 Lumbago with sciatica, unspecified side: Secondary | ICD-10-CM | POA: Diagnosis not present

## 2021-08-13 DIAGNOSIS — M25511 Pain in right shoulder: Secondary | ICD-10-CM | POA: Diagnosis not present

## 2021-08-13 DIAGNOSIS — G8929 Other chronic pain: Secondary | ICD-10-CM | POA: Diagnosis not present

## 2021-08-13 NOTE — Progress Notes (Signed)
? ?Subjective:  ? ? Patient ID: Amber Mccall, female    DOB: 1951/02/19, 71 y.o.   MRN: [id number] ?On line interpreter  ?HPI ?71 year old non-English-speaking [place] woman referred by primary care for low back pain of 30 years duration. ?Back surgery in [place] following a fall did not see MD immediately , but later underwent 2 surgeries in 1993.  Does not recall what level was operated or whether instrumentation was utilized ? ?Had new injury, fall in 2018 , T12 compression fracture, underwent vertebroplasty with good improvement of this pain. ? ?Pt has back pain radiating to R>L lower extremity.  No significant lower extremity weakness no bowel or bladder dysfunction.  The radiating pain has been going on ? ?Scheduled for Right knee replacement Dr Juanna Cao, received recent Knee injection per PCP ? ?Has Right shoulder pain with ADLs , seen by PCP but pt decline PT referral  ? ?No cane for ambulation but sometimes uses walker at home, wears back brace  ?Pain Inventory ?Average Pain 10 ?Pain Right Now 9 ?My pain is intermittent, constant, sharp, dull, stabbing, and aching ? ?In the last 24 hours, has pain interfered with the following? ?General activity  a little ?Relation with others  none ?Enjoyment of life  a lot ?What TIME of day is your pain at its worst? daytime ?Sleep (in general) Poor ? ?Pain is worse with: walking, bending, sitting, inactivity, standing, and some activites ?Pain improves with: rest and medication ?Relief from Meds:  a little ? ?walk without assistance ?ability to climb steps?  yes ?do you drive?  no ?Do you have any goals in this area?  yes ? ?retired ?I need assistance with the following:  dressing, bathing, and household duties ?Do you have any goals in this area?  yes ? ?weakness ?numbness ?tremor ?tingling ?trouble walking ?anxiety ? ?Any changes since last visit?  yes ?CT/MRI ? ?Any changes since last visit?  no ? ? ? ?No family history on file. ?Social History  ? ?Socioeconomic History  ?  Marital status: Widowed  ?  Spouse name: Not on file  ? Number of children: Not on file  ? Years of education: Not on file  ? Highest education level: Not on file  ?Occupational History  ? Not on file  ?Tobacco Use  ? Smoking status: Never  ? Smokeless tobacco: Never  ?Substance and Sexual Activity  ? Alcohol use: Not on file  ? Drug use: Not on file  ? Sexual activity: Not on file  ?Other Topics Concern  ? Not on file  ?Social History Narrative  ? Not on file  ? ?Social Determinants of Health  ? ?Financial Resource Strain: Not on file  ?Food Insecurity: Not on file  ?Transportation Needs: Not on file  ?Physical Activity: Not on file  ?Stress: Not on file  ?Social Connections: Not on file  ? ?Past Surgical History:  ?Procedure Laterality Date  ? Cotton Osteotomy w/ Bone Graft Left 04/16/2015  ? Left foot  ? IR RADIOLOGIST EVAL & MGMT  04/11/2017  ? OPEN REDUCTION INTERNAL FIXATION (ORIF) DISTAL PHALANX Left 10/23/2014  ? 5th Metatarsal  ? ?Past Medical History:  ?Diagnosis Date  ? Closed fracture of metatarsal bone(s) 01/30/2013  ? Essential hypertension   ? Lumbar radiculopathy   ? Osteoarthritis   ? Osteoporosis   ? ?There were no vitals taken for this visit. ? ?Opioid Risk Score:   ?Fall Risk Score:  `1 ? ?Depression screen PHQ 2/9 ? ? ?  07/13/2021  ?  2:28 PM 05/19/2021  ?  1:40 PM  ?Depression screen PHQ 2/9  ?Decreased Interest 0 0  ?Down, Depressed, Hopeless 0 0  ?PHQ - 2 Score 0 0  ?  ?Review of Systems ? ?   ?Objective:  ? Physical Exam ?Vitals and nursing note reviewed.  ?Constitutional:   ?   Appearance: She is normal weight.  ?HENT:  ?   Head: Normocephalic and atraumatic.  ?Eyes:  ?   Extraocular Movements: Extraocular movements intact.  ?   Conjunctiva/sclera: Conjunctivae normal.  ?   Pupils: Pupils are equal, round, and reactive to light.  ?Musculoskeletal:  ?   Comments: [place] with normal ROM , no impingement.  + tenderness R upper trap, Right levator scap and RIght infraspinatus  ? ?Lumbar spine  tenderness L4-5 and S1 bilaterally  ? ?Negative SLR bilaterally   ?Skin: ?   General: Skin is warm and dry.  ?Neurological:  ?   Mental Status: She is alert and oriented to person, place, and time.  ?Psychiatric:     ?   Mood and Affect: Mood normal.     ?   Behavior: Behavior normal.  ? ? ? ? ? ?   ?Assessment & Plan:  ? ? Chronic lumbar pain with radiation to LEs in a pt with remove spine surgery.  Suspect Lumbar spondylosis and stenosis at lower lumbar levels but given LE symptoms will order MRI of lumbar spine  ?Has HEP to be performed starting today , lumbar exercises ?Cervicalgia , myofascial pain R trap, Levator and infraspinatus , overuse rec PT ,has transportation issues does not drive, will order HHPT ?Consider trigger point injections in above muscles if no improvement , if pt wishes to have these done prior to next visit may schedule with Dr Curlene Dolphin ?

## 2021-08-13 NOTE — Patient Instructions (Addendum)
Neck Exercises Ask your health care provider which exercises are safe for you. Do exercises exactly as told by your health care provider and adjust them as directed. It is normal to feel mild stretching, pulling, tightness, or discomfort as you do these exercises. Stop right away if you feel sudden pain or your pain gets worse. Do not begin these exercises until told by your health care provider. Neck exercises can be important for many reasons. They can improve strength and maintain flexibility in your neck, which will help your upper back and prevent neck pain. Stretching exercises Rotation neck stretching  Sit in a chair or stand up. Place your feet flat on the floor, shoulder-width apart. Slowly turn your head (rotate) to the right until a slight stretch is felt. Turn it all the way to the right so you can look over your right shoulder. Do not tilt or tip your head. Hold this position for 10-30 seconds. Slowly turn your head (rotate) to the left until a slight stretch is felt. Turn it all the way to the left so you can look over your left shoulder. Do not tilt or tip your head. Hold this position for 10-30 seconds. Repeat __________ times. Complete this exercise __________ times a day. Neck retraction  Sit in a sturdy chair or stand up. Look straight ahead. Do not bend your neck. Use your fingers to push your chin backward (retraction). Do not bend your neck for this movement. Continue to face straight ahead. If you are doing the exercise properly, you will feel a slight sensation in your throat and a stretch at the back of your neck. Hold the stretch for 1-2 seconds. Repeat __________ times. Complete this exercise __________ times a day. Strengthening exercises Neck press  Lie on your back on a firm bed or on the floor with a pillow under your head. Use your neck muscles to push your head down on the pillow and straighten your spine. Hold the position as well as you can. Keep your head  facing up (in a neutral position) and your chin tucked. Slowly count to 5 while holding this position. Repeat __________ times. Complete this exercise __________ times a day. Isometrics These are exercises in which you strengthen the muscles in your neck while keeping your neck still (isometrics). Sit in a supportive chair and place your hand on your forehead. Keep your head and face facing straight ahead. Do not flex or extend your neck while doing isometrics. Push forward with your head and neck while pushing back with your hand. Hold for 10 seconds. Do the sequence again, this time putting your hand against the back of your head. Use your head and neck to push backward against the hand pressure. Finally, do the same exercise on either side of your head, pushing sideways against the pressure of your hand. Repeat __________ times. Complete this exercise __________ times a day. Prone head lifts  Lie face-down (prone position), resting on your elbows so that your chest and upper back are raised. Start with your head facing downward, near your chest. Position your chin either on or near your chest. Slowly lift your head upward. Lift until you are looking straight ahead. Then continue lifting your head as far back as you can comfortably stretch. Hold your head up for 5 seconds. Then slowly lower it to your starting position. Repeat __________ times. Complete this exercise __________ times a day. Supine head lifts  Lie on your back (supine position), bending your knees   to point to the ceiling and keeping your feet flat on the floor. Lift your head slowly off the floor, raising your chin toward your chest. Hold for 5 seconds. Repeat __________ times. Complete this exercise __________ times a day. Scapular retraction  Stand with your arms at your sides. Look straight ahead. Slowly pull both shoulders (scapulae) backward and downward (retraction) until you feel a stretch between your shoulder  blades in your upper back. Hold for 10-30 seconds. Relax and repeat. Repeat __________ times. Complete this exercise __________ times a day. Contact a health care provider if: Your neck pain or discomfort gets worse when you do an exercise. Your neck pain or discomfort does not improve within 2 hours after you exercise. If you have any of these problems, stop exercising right away. Do not do the exercises again unless your health care provider says that you can. Get help right away if: You develop sudden, severe neck pain. If this happens, stop exercising right away. Do not do the exercises again unless your health care provider says that you can. This information is not intended to replace advice given to you by your health care provider. Make sure you discuss any questions you have with your health care provider. Document Revised: 10/06/2020 Document Reviewed: 10/06/2020 [employer] Patient Education  2023 [employer]  

## 2021-08-16 ENCOUNTER — Ambulatory Visit: Payer: Medicare Other | Admitting: Family Medicine

## 2021-08-18 ENCOUNTER — Ambulatory Visit (INDEPENDENT_AMBULATORY_CARE_PROVIDER_SITE_OTHER): Payer: Medicare Other | Admitting: Family Medicine

## 2021-08-18 ENCOUNTER — Encounter: Payer: Self-pay | Admitting: Family Medicine

## 2021-08-18 VITALS — BP 110/78 | HR 92 | Temp 99.3°F | Ht <= 58 in | Wt 140.0 lb

## 2021-08-18 DIAGNOSIS — G8929 Other chronic pain: Secondary | ICD-10-CM

## 2021-08-18 DIAGNOSIS — S46811D Strain of other muscles, fascia and tendons at shoulder and upper arm level, right arm, subsequent encounter: Secondary | ICD-10-CM | POA: Diagnosis not present

## 2021-08-18 DIAGNOSIS — M7501 Adhesive capsulitis of right shoulder: Secondary | ICD-10-CM

## 2021-08-18 DIAGNOSIS — K219 Gastro-esophageal reflux disease without esophagitis: Secondary | ICD-10-CM

## 2021-08-18 DIAGNOSIS — M25511 Pain in right shoulder: Secondary | ICD-10-CM | POA: Diagnosis not present

## 2021-08-18 MED ORDER — PANTOPRAZOLE SODIUM 40 MG PO TBEC: 40.0000 mg | DELAYED_RELEASE_TABLET | Freq: Every day | ORAL | 1 refills | Status: DC

## 2021-08-18 MED ORDER — DICLOFENAC SODIUM 1 % EX GEL: 4.0000 g | Freq: Four times a day (QID) | CUTANEOUS | 2 refills | Status: DC

## 2021-08-18 NOTE — Patient Instructions (Addendum)
Newt Minion, MD ?[address] [place], [place] [address]  ?[phone] ? ?Find out what physical therapy company is coming to your house and let me know so we can coordinate for your shoulder/trapezius region. ? ?Ice/cold pack over area for 10-15 min twice daily. ? ?Heat (pad or rice pillow in microwave) over affected area, 10-15 minutes twice daily.  ? ?OK to take Tylenol 1000 mg (2 extra strength tabs) or 975 mg (3 regular strength tabs) every 6 hours as needed. ? ?Trapezius stretches/exercises ?Do exercises exactly as told by your health care provider and adjust them as directed. It is normal to feel mild stretching, pulling, tightness, or discomfort as you do these exercises, but you should stop right away if you feel sudden pain or your pain gets worse.  ? ?Stretching and range of motion exercises ?These exercises warm up your muscles and joints and improve the movement and flexibility of your shoulder. These exercises can also help to relieve pain, numbness, and tingling. If you are unable to do any of the following for any reason, do not further attempt to do it.  ? ?Exercise A: Flexion, standing  ? ?  ?Stand and hold a broomstick, a cane, or a similar object. Place your hands a little more than shoulder-width apart on the object. Your left / right hand should be palm-up, and your other hand should be palm-down. ?Push the stick to raise your left / right arm out to your side and then over your head. Use your other hand to help move the stick. Stop when you feel a stretch in your shoulder, or when you reach the angle that is recommended by your health care provider. ?Avoid shrugging your shoulder while you raise your arm. Keep your shoulder blade tucked down toward your spine. ?Hold for 30 seconds. ?Slowly return to the starting position. ?Repeat 2 times. Complete this exercise 3 times per week. ? ?Exercise B: Abduction, supine  ? ?  ?Lie on your back and hold a broomstick, a cane, or a similar object.  Place your hands a little more than shoulder-width apart on the object. Your left / right hand should be palm-up, and your other hand should be palm-down. ?Push the stick to raise your left / right arm out to your side and then over your head. Use your other hand to help move the stick. Stop when you feel a stretch in your shoulder, or when you reach the angle that is recommended by your health care provider. ?Avoid shrugging your shoulder while you raise your arm. Keep your shoulder blade tucked down toward your spine. ?Hold for 30 seconds. ?Slowly return to the starting position. ?Repeat 2 times. Complete this exercise 3 times per week. ? ?Exercise C: Flexion, active-assisted  ? ?  ?Lie on your back. You may bend your knees for comfort. ?Hold a broomstick, a cane, or a similar object. Place your hands about shoulder-width apart on the object. Your palms should face toward your feet. ?Raise the stick and move your arms over your head and behind your head, toward the floor. Use your healthy arm to help your left / right arm move farther. Stop when you feel a gentle stretch in your shoulder, or when you reach the angle where your health care provider tells you to stop. ?Hold for 30 seconds. ?Slowly return to the starting position. ?Repeat 2 times. Complete this exercise 3 times per week. ? ?Exercise D: External rotation and abduction  ? ?  ?  Stand in a door frame with one of your feet slightly in front of the other. This is called a staggered stance. ?Choose one of the following positions as told by your health care provider: ?Place your hands and forearms on the door frame above your head. ?Place your hands and forearms on the door frame at the height of your head. ?Place your hands on the door frame at the height of your elbows. ?Slowly move your weight onto your front foot until you feel a stretch across your chest and in the front of your shoulders. Keep your head and chest upright and keep your abdominal muscles  tight. ?Hold for 30 seconds. ?To release the stretch, shift your weight to your back foot. ?Repeat 2 times. Complete this stretch 3 times per week. ? ?Strengthening exercises ?These exercises build strength and endurance in your shoulder. Endurance is the ability to use your muscles for a long time, even after your muscles get tired. ?Exercise E: Scapular depression and adduction  ?Sit on a stable chair. Support your arms in front of you with pillows, armrests, or a tabletop. Keep your elbows in line with the sides of your body. ?Gently move your shoulder blades down toward your middle back. Relax the muscles on the tops of your shoulders and in the back of your neck. ?Hold for 3 seconds. ?Slowly release the tension and relax your muscles completely before doing this exercise again. Repeat for a total of 10 repetitions. ?After you have practiced this exercise, try doing the exercise without the arm support. Then, try the exercise while standing instead of sitting. ?Repeat 2 times. Complete this exercise 3 times per week. ? ?Exercise F: Shoulder abduction, isometric  ? ?  ?Stand or sit about 4-6 inches (10-15 cm) from a wall with your left / right side facing the wall. ?Bend your left / right elbow and gently press your elbow against the wall. ?Increase the pressure slowly until you are pressing as hard as you can without shrugging your shoulder. ?Hold for 3 seconds. ?Slowly release the tension and relax your muscles completely. Repeat for a total of 10 repetitions. ?Repeat 2 times. Complete this exercise 3 times per week. ? ?Exercise G: Shoulder flexion, isometric  ? ?  ?Stand or sit about 4-6 inches (10-15 cm) away from a wall with your left / right side facing the wall. ?Keep your left / right elbow straight and gently press the top of your fist against the wall. Increase the pressure slowly until you are pressing as hard as you can without shrugging your shoulder. ?Hold for 10-15 seconds. ?Slowly release the  tension and relax your muscles completely. Repeat for a total of 10 repetitions. ?Repeat 2 times. Complete this exercise 3 times per week. ? ?Exercise H: Internal rotation  ? ?  ?Sit in a stable chair without armrests, or stand. Secure an exercise band at your left / right side, at elbow height. ?Place a soft object, such as a folded towel or a small pillow, under your left / right upper arm so your elbow is a few inches (about 8 cm) away from your side. ?Hold the end of the exercise band so the band stretches. ?Keeping your elbow pressed against the soft object under your arm, move your forearm across your body toward your abdomen. Keep your body steady so the movement is only coming from your shoulder. ?Hold for 3 seconds. ?Slowly return to the starting position. Repeat for a total of 10 repetitions. ?  Repeat 2 times. Complete this exercise 3 times per week. ? ?Exercise I: External rotation  ? ?  ?Sit in a stable chair without armrests, or stand. ?Secure an exercise band at your left / right side, at elbow height. ?Place a soft object, such as a folded towel or a small pillow, under your left / right upper arm so your elbow is a few inches (about 8 cm) away from your side. ?Hold the end of the exercise band so the band stretches. ?Keeping your elbow pressed against the soft object under your arm, move your forearm out, away from your abdomen. Keep your body steady so the movement is only coming from your shoulder. ?Hold for 3 seconds. ?Slowly return to the starting position. Repeat for a total of 10 repetitions. ?Repeat 2 times. Complete this exercise 3 times per week. ?Exercise J: Shoulder extension  ?Sit in a stable chair without armrests, or stand. Secure an exercise band to a stable object in front of you so the band is at shoulder height. ?Hold one end of the exercise band in each hand. Your palms should face each other. ?Straighten your elbows and lift your hands up to shoulder height. ?Step back, away from  the secured end of the exercise band, until the band stretches. ?Squeeze your shoulder blades together and pull your hands down to the sides of your thighs. Stop when your hands are straight down by your sid

## 2021-08-18 NOTE — Progress Notes (Signed)
Musculoskeletal Exam ? ?Patient: Amber Mccall DOB: 08/29/1950 ? ?DOS: 08/18/2021 ? ?SUBJECTIVE: ? ?Chief Complaint:  ? ?Chief Complaint  ?Patient presents with  ? Shoulder Pain  ? ? ?Amber Mccall is a 71 y.o.  female for evaluation and treatment of R shoulder pain.  She is here with a friend who helps interpret ([place]). ? ?Onset:  6 months ago. No inj or change in activity.  ?Location: R shoulder ?Character:  aching and sharp  ?Progression of issue:  is unchanged ?Associated symptoms: weakness 2/2 pain, decreased ROM ?No bruising, redness, swelling.  ?Treatment: to date has been acetaminophen and hydrocodone.   ?Neurovascular symptoms: no ? ?Past Medical History:  ?Diagnosis Date  ? Closed fracture of metatarsal bone(s) 01/30/2013  ? Essential hypertension   ? Lumbar radiculopathy   ? Osteoarthritis   ? Osteoporosis   ? ? ?Objective: ?VITAL SIGNS: BP 110/78   Pulse 92   Temp 99.3 ?F (37.4 ?C) (Oral)   Ht '4\' 9"'$  (1.448 m)   Wt 140 lb (63.5 kg)   SpO2 99%   BMI 30.30 kg/m?  ?Constitutional: Well formed, well developed. No acute distress. ?Thorax & Lungs: No accessory muscle use ?Musculoskeletal: R shoulder.   ?Normal active range of motion: no.   ?Normal passive range of motion: no ?Tenderness to palpation: yes over R trapezius with hypertonic msc ?Deformity: no ?Ecchymosis: no ?Tests positive: Neer's, Empty can, Hawkins ?Tests negative: Lift off, Speed's, Obriens, cross over ?Neurologic: Normal sensory function. No focal deficits noted. DTR's equal and symmetric in UE's. No clonus. ?Psychiatric: Normal mood. Age appropriate judgment and insight. Alert & oriented x 3.   ? ?Assessment: ? ?Chronic right shoulder pain ? ?Strain of right trapezius muscle, subsequent encounter ? ?Adhesive capsulitis of right shoulder ? ?Gastroesophageal reflux disease, unspecified whether esophagitis present - Plan: Ambulatory referral to Gastroenterology ? ?Plan: ?Stretches/exercises-needs to be diligent with this, heat, ice, Tylenol.   She will continue meloxicam, Voltaren gel as needed, tramadol, and tizanidine.  Will set up home PT given pain limiting mobility and ability to leave house. Friend will find out what company is coming to her house for her back and we can coordinate.  ?F/u in 1 mo, will inject trapezius with a trigger point injection or shoulder if no improvement. ?The patient and friend voiced understanding and agreement to the plan. ? ?I spent 35 minutes with the patient and her friend discussing above plan in addition to reviewing her chronic medications and cleaning up her current list.  This was done on the same day of the visit. ? ?Shelda Pal, DO ?08/18/21  ?5:04 PM ? ?

## 2021-08-21 ENCOUNTER — Ambulatory Visit (HOSPITAL_BASED_OUTPATIENT_CLINIC_OR_DEPARTMENT_OTHER)
Admission: RE | Admit: 2021-08-21 | Discharge: 2021-08-21 | Disposition: A | Discharge status: Home / Self Care | Payer: Medicare Other | Source: Ambulatory Visit | Attending: Physical Medicine & Rehabilitation | Admitting: Physical Medicine & Rehabilitation

## 2021-08-21 DIAGNOSIS — G8929 Other chronic pain: Secondary | ICD-10-CM | POA: Insufficient documentation

## 2021-08-21 DIAGNOSIS — M5440 Lumbago with sciatica, unspecified side: Secondary | ICD-10-CM | POA: Insufficient documentation

## 2021-08-21 DIAGNOSIS — M4727 Other spondylosis with radiculopathy, lumbosacral region: Secondary | ICD-10-CM | POA: Diagnosis not present

## 2021-08-21 DIAGNOSIS — M5117 Intervertebral disc disorders with radiculopathy, lumbosacral region: Secondary | ICD-10-CM | POA: Diagnosis not present

## 2021-08-21 IMAGING — MR MR LUMBAR SPINE W/O CM
4 of 5 series · 25 of 48 positions shown · non-contrast
Comparison: MR lumbar [DATE]; X-ray lumbar [DATE].

CLINICAL DATA: Chronic low back pain with sciatica, sciatica
laterality unspecified, unspecified back pain laterality M54.40,
[C1] ([C1]-CM). Chronic low back with bilateral leg pain related
to an old injury 20 years ago.

EXAM:
MRI LUMBAR SPINE WITHOUT CONTRAST
TECHNIQUE: Multiplanar, multisequence MR imaging of the lumbar spine was
performed. No intravenous contrast was administered.

[Series 2: T1 · sagittal · 4.0mm · 0.81mm/px · 6 of 16 slices shown (1 of 2)]
[im 1/16]
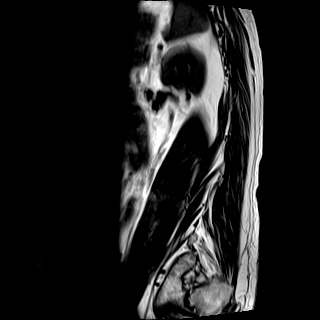
[im 4/16]
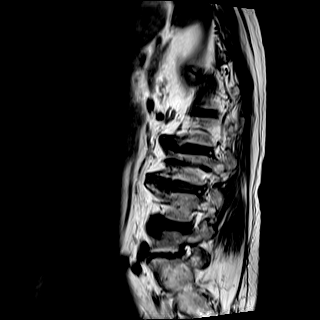
[im 7/16]
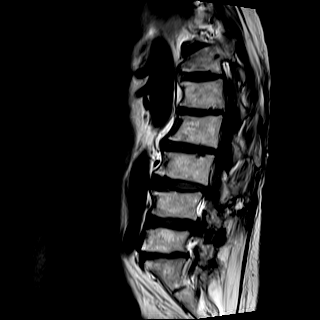
[im 10/16]
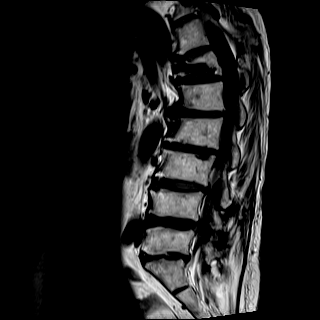
[im 13/16]
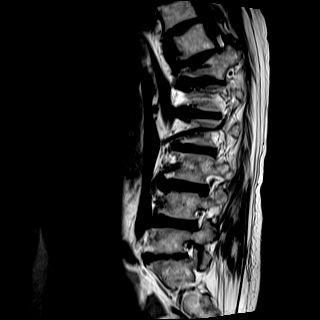
[im 16/16]
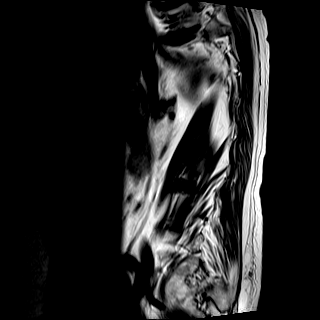

[Series 3: T2 · sagittal · 4.0mm · 0.81mm/px · 6 of 16 slices shown (1 of 2)]
[im 1/16]
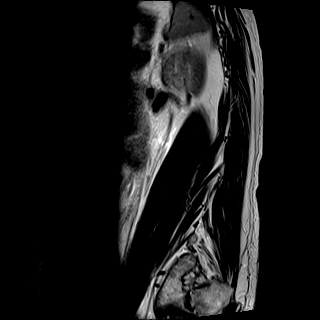
[im 4/16]
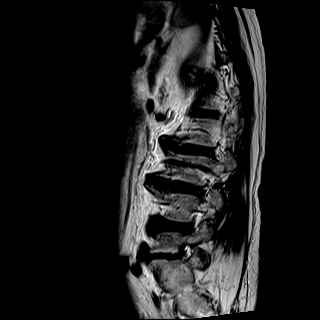
[im 7/16]
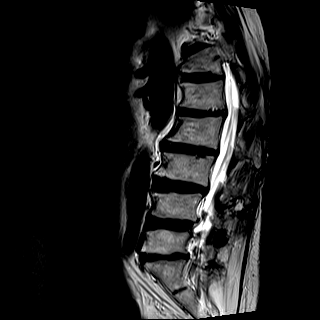
[im 10/16]
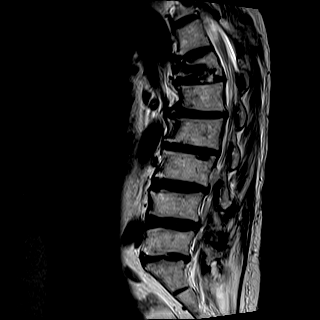
[im 13/16]
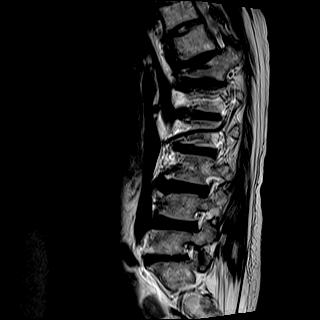
[im 16/16]
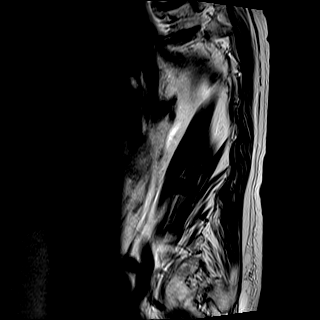

[Series 5: T2 · axial · 4.0mm · 0.39mm/px · z∈[-57,+138]mm · 9 of 40 slices shown (2 of 2)]
[im 1/40]
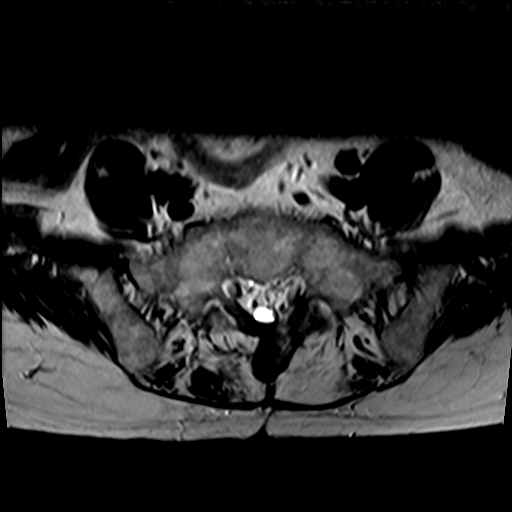
[im 6/40]
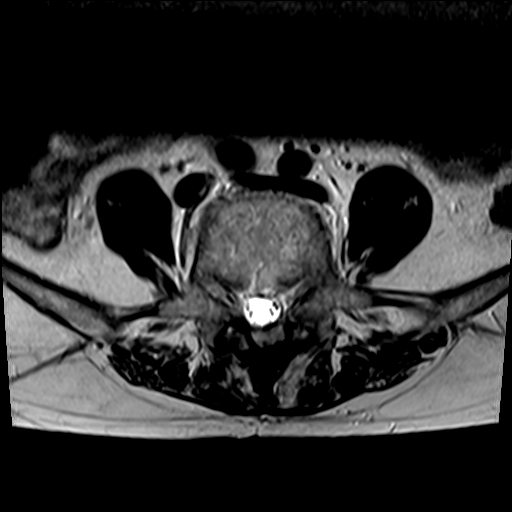
[im 12/40]
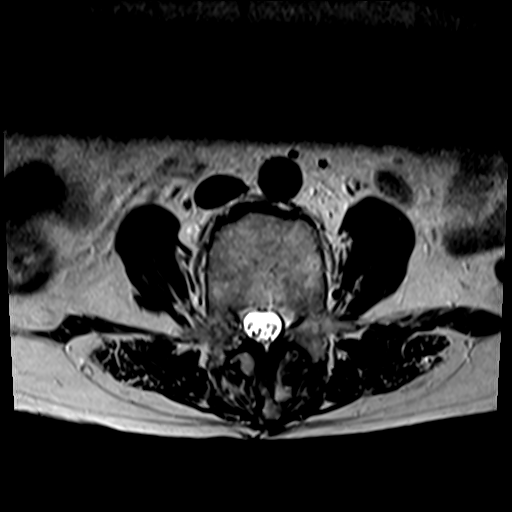
[im 17/40]
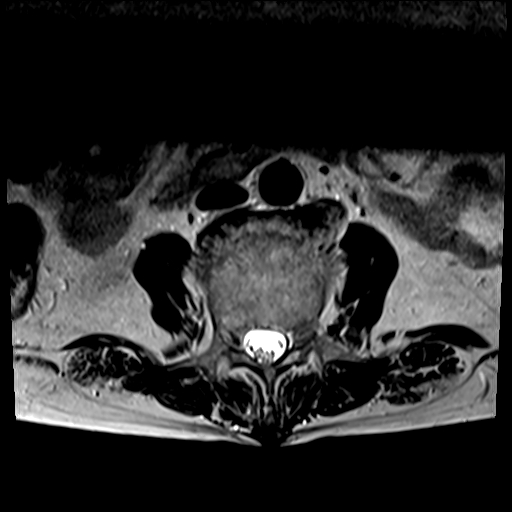
[im 20/40]
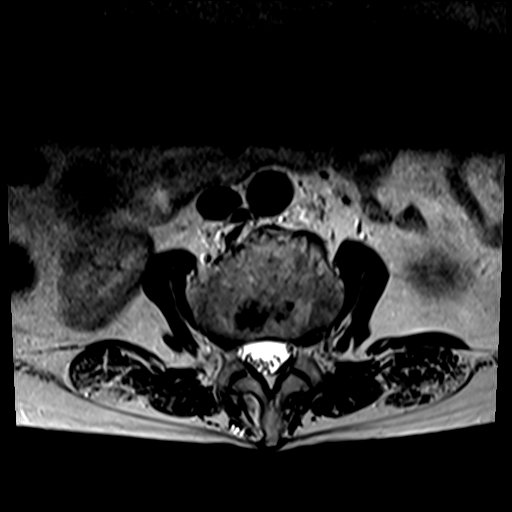
[im 23/40]
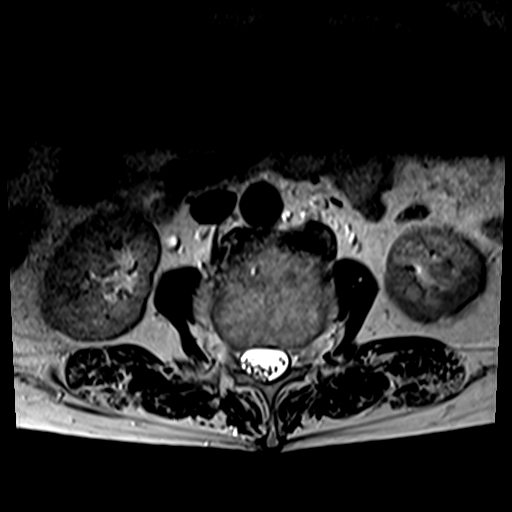
[im 28/40]
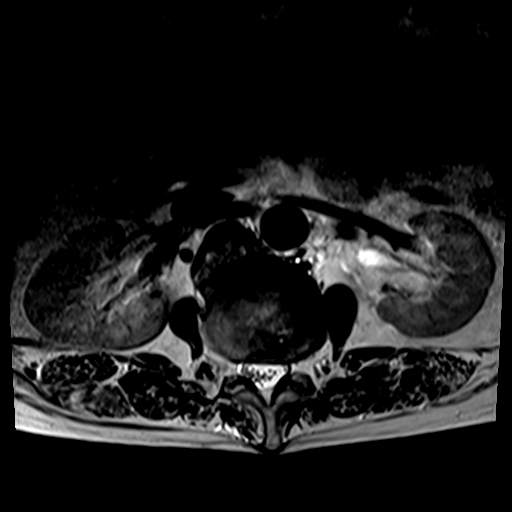
[im 34/40]
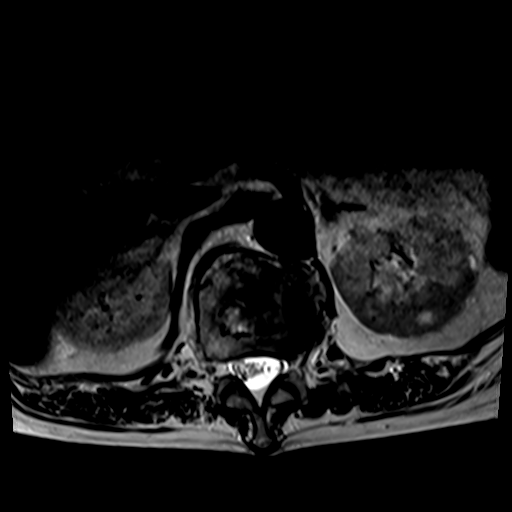
[im 40/40]
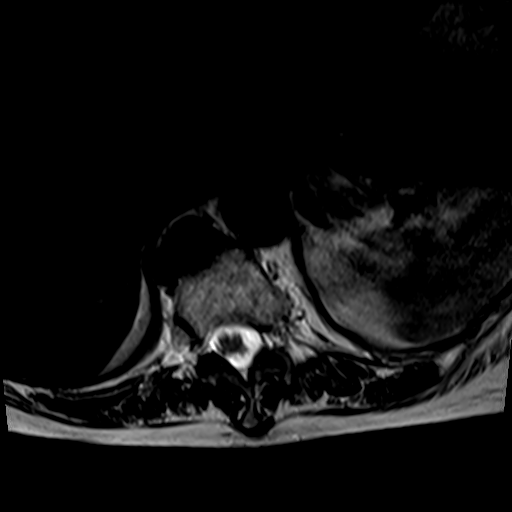

[Series 6: T1 · axial · 4.0mm · 0.39mm/px · z∈[-57,+108]mm · 4 of 40 slices shown (2 of 2)]
[im 1/40]
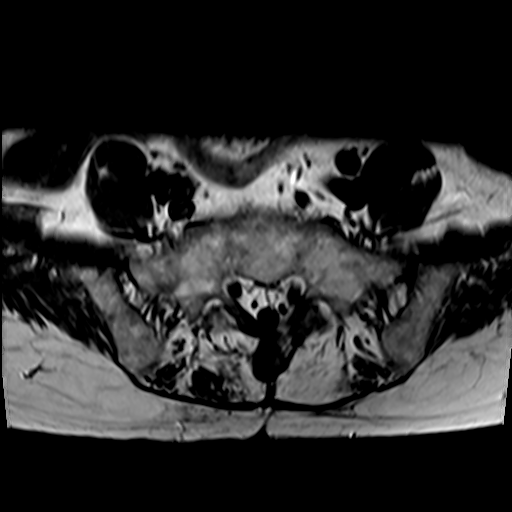
[im 6/40]
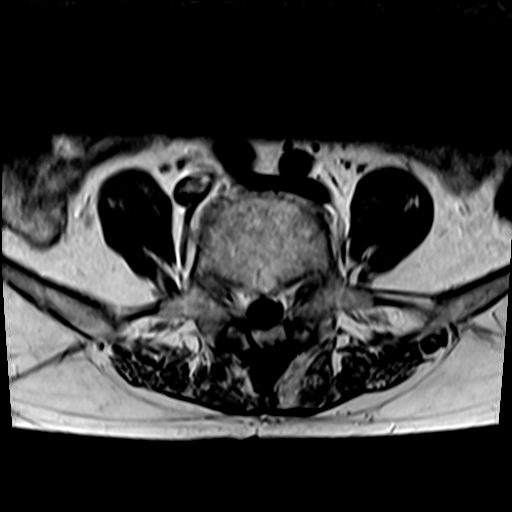
[im 20/40]
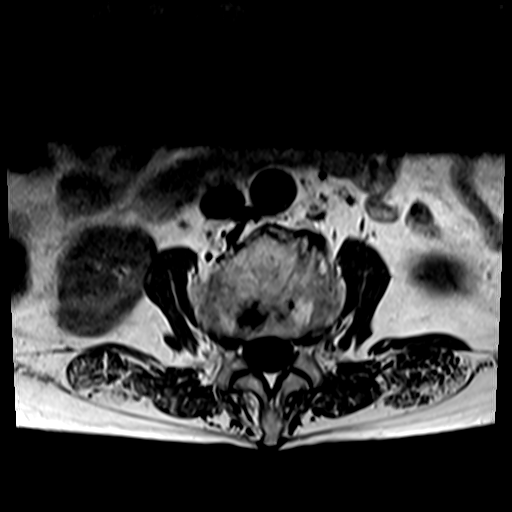
[im 34/40]
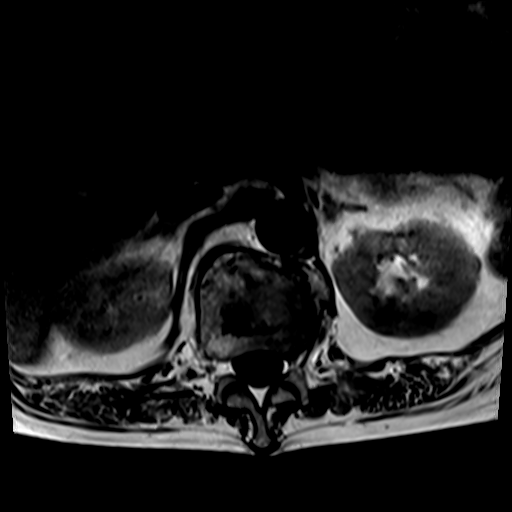

[25 of 48 positions shown; findings below may reference images not displayed]

FINDINGS: Segmentation:  Standard.

Alignment: Kyphotic deformity and levoconvex scoliosis at the
thoracolumbar junction related to T12 chronic compression fracture.
Trace retrolisthesis at L3-4.

Vertebrae: No acute fracture, evidence of discitis, or bone lesion.
Chronic compression fracture of the T12 vertebral body status post
vertebral augmentation.

Conus medullaris and cauda equina: Conus extends to the T12 level.
Conus and cauda equina appear normal.

Paraspinal and other soft tissues: Unchanged atrophy of paraspinal
musculature.

Disc levels:

T11-12: No spinal.

T12-L1: No spinal canal or neural foraminal.

L1-2: Shallow disc bulge with associated osteophytic component. No
significant spinal canal or neural foraminal stenosis.

L2-3 shallow disc bulge. No spinal canal or neural foraminal
stenosis.

L3-4: Disc bulge and mild facet degenerative changes resulting in
mild bilateral neural foraminal narrowing, unchanged. No significant
spinal canal stenosis. Unchanged appearance of interspinous
degenerative changes.

L4-5: Postsurgical changes from left laminotomy. There appears to be
anterior retraction of the left lateral aspect of the thecal sac and
left L5 nerve root sleeve which could be due to adherence (series 5,
image 32). Disc bulge and mild facet degenerative changes resulting
in bilateral neural foraminal narrowing. No significant spinal canal
stenosis.

L5-S1: Disc bulge with associated osteophytic component and mild
facet degenerative changes resulting in mild left neural foraminal
narrowing. No significant spinal canal stenosis.
IMPRESSION: 1. Chronic compression fracture of the T12 vertebral body status
post vertebral augmentation.
2. Degenerative changes of the lumbar spine without high-grade
spinal canal or neural foraminal stenosis.
3. At L4-5, there appears to be mild anterior retraction of the left
lateral aspect of the thecal sac and left L5 nerve root sleeve which
could be due to adherence.

## 2021-08-23 ENCOUNTER — Ambulatory Visit (INDEPENDENT_AMBULATORY_CARE_PROVIDER_SITE_OTHER): Payer: Medicare Other | Admitting: Orthopedic Surgery

## 2021-08-23 ENCOUNTER — Encounter: Payer: Self-pay | Admitting: Orthopedic Surgery

## 2021-08-23 DIAGNOSIS — M62572 Muscle wasting and atrophy, not elsewhere classified, left ankle and foot: Secondary | ICD-10-CM | POA: Diagnosis not present

## 2021-08-23 DIAGNOSIS — M21372 Foot drop, left foot: Secondary | ICD-10-CM | POA: Diagnosis not present

## 2021-08-23 DIAGNOSIS — M79672 Pain in left foot: Secondary | ICD-10-CM

## 2021-08-23 NOTE — Progress Notes (Signed)
? ?Office Visit Note ?  ?Patient: Amber Mccall           ?Date of Birth: 08-11-1950           ?MRN: [id number] ?Visit Date: 08/23/2021 ?             ?Requested by: Shelda Pal, DO ?[address] ?[address] ?[place],  [place] [id number] ?PCP: Shelda Pal, DO ? ?Chief Complaint  ?Patient presents with  ? Left Ankle - Follow-up  ?  MRI review   ? ? ? ? ?HPI: ?Patient is a 71 year old woman with multiple medical problems.  She has a frozen right shoulder she has persistent lower back symptoms and is status post an MRI scan.  She states that her ankle pain is only sporadic at this time.  Patient states she is going to undergo knee surgery on June 27. ? ?Assessment & Plan: ?Visit Diagnoses:  ?1. Foot drop, left   ?2. Atrophy of muscle of left foot   ?3. Pain in left foot   ? ? ?Plan: Patient was given a prescription for physical therapy for strengthening of the left ankle. ? ?Follow-Up Instructions: Return if symptoms worsen or fail to improve.  ? ?Ortho Exam ? ?Patient is alert, oriented, no adenopathy, well-dressed, normal affect, normal respiratory effort. ?Patient is status post MRI scan of her lumbar spine and has had physical therapy ordered for her frozen right shoulder.  Examination the ankle she does have laxity of the anterior talofibular ligament anterior drawer is stable she has a good pulse there is no tenderness to palpation globally around the left ankle. ? ?Review of the MRI scan of the left ankle shows no specific bony or ligamentous instability. ? ?Imaging: ?No results found. ?No images are attached to the encounter. ? ?Labs: ?No results found for: HGBA1C, ESRSEDRATE, CRP, LABURIC, REPTSTATUS, GRAMSTAIN, CULT, LABORGA ? ? ?Lab Results  ?Component Value Date  ? ALBUMIN 4.6 05/19/2021  ? ? ?No results found for: MG ?No results found for: VD25OH ? ?No results found for: PREALBUMIN ? ?  Latest Ref Rng & Units 05/19/2021  ?  2:20 PM  ?CBC EXTENDED  ?WBC 4.0 - 10.5 K/uL 6.3    ?RBC 3.87 -  5.11 Mil/uL 4.13    ?Hemoglobin 12.0 - 15.0 g/dL 12.5    ?HCT 36.0 - 46.0 % 38.1    ?Platelets 150.0 - 400.0 K/uL 270.0    ? ? ? ?There is no height or weight on file to calculate BMI. ? ?Orders:  ?No orders of the defined types were placed in this encounter. ? ?No orders of the defined types were placed in this encounter. ? ? ? Procedures: ?No procedures performed ? ?Clinical Data: ?No additional findings. ? ?ROS: ? ?All other systems negative, except as noted in the HPI. ?Review of Systems ? ?Objective: ?Vital Signs: There were no vitals taken for this visit. ? ?Specialty Comments:  ?No specialty comments available. ? ?PMFS History: ?Patient Active Problem List  ? Diagnosis Date Noted  ? Chronic right shoulder pain 08/18/2021  ? Adhesive capsulitis of right shoulder 08/18/2021  ? Gastroesophageal reflux disease 08/18/2021  ? Primary osteoarthritis of left knee 07/27/2021  ? Primary osteoarthritis involving multiple joints 07/13/2021  ? Strain of right trapezius muscle 07/13/2021  ? Essential hypertension 05/19/2021  ? Hypertriglyceridemia 05/19/2021  ? Osteoporosis 05/19/2021  ? Lumbar radiculopathy 05/19/2021  ? Osteoarthritis 05/19/2021  ? Depression, major, single episode, in partial remission ([place]) 05/19/2021  ?  Chronic bilateral low back pain 05/19/2021  ? Status post left foot surgery 04/23/2015  ? Injury of foot, left 04/08/2015  ? Sprain of ankle 04/08/2015  ? Deformity of metatarsal bone of left foot 04/08/2015  ? Metatarsalgia of left foot 02/10/2015  ? Tenosynovitis of foot 02/10/2015  ? Chronic pain of both knees 02/10/2015  ? Closed fracture of metatarsal bone 10/21/2014  ? Edema of left foot 10/21/2014  ? Closed fracture of metatarsal bone(s) 01/30/2013  ? Pain, foot 01/30/2013  ? Abnormal foot finding 01/30/2013  ? ?Past Medical History:  ?Diagnosis Date  ? Closed fracture of metatarsal bone(s) 01/30/2013  ? Essential hypertension   ? Lumbar radiculopathy   ? Osteoarthritis   ? Osteoporosis   ?   ?History reviewed. No pertinent family history.  ?Past Surgical History:  ?Procedure Laterality Date  ? Cotton Osteotomy w/ Bone Graft Left 04/16/2015  ? Left foot  ? IR RADIOLOGIST EVAL & MGMT  04/11/2017  ? OPEN REDUCTION INTERNAL FIXATION (ORIF) DISTAL PHALANX Left 10/23/2014  ? 5th Metatarsal  ? ?Social History  ? ?Occupational History  ? Not on file  ?Tobacco Use  ? Smoking status: Never  ? Smokeless tobacco: Never  ?Vaping Use  ? Vaping Use: Never used  ?Substance and Sexual Activity  ? Alcohol use: Not Currently  ? Drug use: Not Currently  ? Sexual activity: Not on file  ? ? ? ? ? ?

## 2021-08-24 DIAGNOSIS — M19011 Primary osteoarthritis, right shoulder: Secondary | ICD-10-CM | POA: Diagnosis not present

## 2021-08-24 DIAGNOSIS — R2689 Other abnormalities of gait and mobility: Secondary | ICD-10-CM | POA: Diagnosis not present

## 2021-08-24 DIAGNOSIS — G8929 Other chronic pain: Secondary | ICD-10-CM | POA: Diagnosis not present

## 2021-08-24 DIAGNOSIS — I10 Essential (primary) hypertension: Secondary | ICD-10-CM | POA: Diagnosis not present

## 2021-08-24 DIAGNOSIS — M6281 Muscle weakness (generalized): Secondary | ICD-10-CM | POA: Diagnosis not present

## 2021-08-24 DIAGNOSIS — M25511 Pain in right shoulder: Secondary | ICD-10-CM | POA: Diagnosis not present

## 2021-08-24 DIAGNOSIS — M1711 Unilateral primary osteoarthritis, right knee: Secondary | ICD-10-CM | POA: Diagnosis not present

## 2021-08-25 DIAGNOSIS — Z8781 Personal history of (healed) traumatic fracture: Secondary | ICD-10-CM | POA: Diagnosis not present

## 2021-08-25 DIAGNOSIS — M810 Age-related osteoporosis without current pathological fracture: Secondary | ICD-10-CM | POA: Diagnosis not present

## 2021-08-25 DIAGNOSIS — E673 Hypervitaminosis D: Secondary | ICD-10-CM | POA: Diagnosis not present

## 2021-08-25 DIAGNOSIS — Z7189 Other specified counseling: Secondary | ICD-10-CM | POA: Diagnosis not present

## 2021-08-27 DIAGNOSIS — M25511 Pain in right shoulder: Secondary | ICD-10-CM | POA: Diagnosis not present

## 2021-08-27 DIAGNOSIS — M19011 Primary osteoarthritis, right shoulder: Secondary | ICD-10-CM | POA: Diagnosis not present

## 2021-08-27 DIAGNOSIS — M6281 Muscle weakness (generalized): Secondary | ICD-10-CM | POA: Diagnosis not present

## 2021-08-27 DIAGNOSIS — M1711 Unilateral primary osteoarthritis, right knee: Secondary | ICD-10-CM | POA: Diagnosis not present

## 2021-08-27 DIAGNOSIS — G8929 Other chronic pain: Secondary | ICD-10-CM | POA: Diagnosis not present

## 2021-08-27 DIAGNOSIS — I10 Essential (primary) hypertension: Secondary | ICD-10-CM | POA: Diagnosis not present

## 2021-08-27 DIAGNOSIS — R2689 Other abnormalities of gait and mobility: Secondary | ICD-10-CM | POA: Diagnosis not present

## 2021-08-30 DIAGNOSIS — M1711 Unilateral primary osteoarthritis, right knee: Secondary | ICD-10-CM | POA: Diagnosis not present

## 2021-08-30 DIAGNOSIS — G8929 Other chronic pain: Secondary | ICD-10-CM | POA: Diagnosis not present

## 2021-08-30 DIAGNOSIS — M19011 Primary osteoarthritis, right shoulder: Secondary | ICD-10-CM | POA: Diagnosis not present

## 2021-08-30 DIAGNOSIS — M25511 Pain in right shoulder: Secondary | ICD-10-CM | POA: Diagnosis not present

## 2021-08-30 DIAGNOSIS — M6281 Muscle weakness (generalized): Secondary | ICD-10-CM | POA: Diagnosis not present

## 2021-08-30 DIAGNOSIS — I10 Essential (primary) hypertension: Secondary | ICD-10-CM | POA: Diagnosis not present

## 2021-08-30 DIAGNOSIS — R2689 Other abnormalities of gait and mobility: Secondary | ICD-10-CM | POA: Diagnosis not present

## 2021-08-31 ENCOUNTER — Encounter: Payer: Self-pay | Admitting: Gastroenterology

## 2021-09-01 DIAGNOSIS — M25511 Pain in right shoulder: Secondary | ICD-10-CM | POA: Diagnosis not present

## 2021-09-01 DIAGNOSIS — M1711 Unilateral primary osteoarthritis, right knee: Secondary | ICD-10-CM | POA: Diagnosis not present

## 2021-09-01 DIAGNOSIS — R2689 Other abnormalities of gait and mobility: Secondary | ICD-10-CM | POA: Diagnosis not present

## 2021-09-01 DIAGNOSIS — M19011 Primary osteoarthritis, right shoulder: Secondary | ICD-10-CM | POA: Diagnosis not present

## 2021-09-01 DIAGNOSIS — I10 Essential (primary) hypertension: Secondary | ICD-10-CM | POA: Diagnosis not present

## 2021-09-01 DIAGNOSIS — G8929 Other chronic pain: Secondary | ICD-10-CM | POA: Diagnosis not present

## 2021-09-01 DIAGNOSIS — M6281 Muscle weakness (generalized): Secondary | ICD-10-CM | POA: Diagnosis not present

## 2021-09-09 ENCOUNTER — Other Ambulatory Visit: Payer: Self-pay

## 2021-09-13 ENCOUNTER — Encounter: Payer: Self-pay | Admitting: Family Medicine

## 2021-09-13 ENCOUNTER — Ambulatory Visit (INDEPENDENT_AMBULATORY_CARE_PROVIDER_SITE_OTHER): Payer: Medicare Other | Admitting: Family Medicine

## 2021-09-13 VITALS — BP 120/70 | HR 68 | Temp 98.2°F | Ht <= 58 in | Wt 142.5 lb

## 2021-09-13 DIAGNOSIS — Z01818 Encounter for other preprocedural examination: Secondary | ICD-10-CM | POA: Diagnosis not present

## 2021-09-13 DIAGNOSIS — M25511 Pain in right shoulder: Secondary | ICD-10-CM

## 2021-09-13 MED ORDER — METHYLPREDNISOLONE ACETATE 40 MG/ML IJ SUSP
40.0000 mg | Freq: Once | INTRAMUSCULAR | Status: AC
  Administered 2021-09-13: 40 mg via INTRA_ARTICULAR

## 2021-09-13 NOTE — Patient Instructions (Addendum)
Heat (pad or rice pillow in microwave) over affected area, 10-15 minutes twice daily.  Ice/cold pack over area for 10-15 min twice daily.  OK to take Tylenol 1000 mg (2 extra strength tabs) or 975 mg (3 regular strength tabs) every 6 hours as needed.  09-28-21, 11:30 AM with Dr. Letta Pate.   Let us know if you need anything.  Hand Exercises Hand exercises can be helpful for almost anyone. These exercises can strengthen the hands, improve flexibility and movement, and increase blood flow to the hands. These results can make work and daily tasks easier. Hand exercises can be especially helpful for people who have joint pain from arthritis or have nerve damage from overuse (carpal tunnel syndrome). These exercises can also help people who have injured a hand. Exercises Most of these hand exercises are gentle stretching and motion exercises. It is usually safe to do them often throughout the day. Warming up your hands before exercise may help to reduce stiffness. You can do this with gentle massage or by placing your hands in warm water for 10-15 minutes. It is normal to feel some stretching, pulling, tightness, or mild discomfort as you begin new exercises. This will gradually improve. Stop an exercise right away if you feel sudden, severe pain or your pain gets worse. Ask your health care provider which exercises are best for you. Knuckle bend or "claw" fist Stand or sit with your arm, hand, and all five fingers pointed straight up. Make sure to keep your wrist straight during the exercise. Gently bend your fingers down toward your palm until the tips of your fingers are touching the top of your palm. Keep your big knuckle straight and just bend the small knuckles in your fingers. Hold this position for 3 seconds. Straighten (extend) your fingers back to the starting position. Repeat this exercise 5-10 times with each hand. Full finger fist Stand or sit with your arm, hand, and all five fingers  pointed straight up. Make sure to keep your wrist straight during the exercise. Gently bend your fingers into your palm until the tips of your fingers are touching the middle of your palm. Hold this position for 3 seconds. Extend your fingers back to the starting position, stretching every joint fully. Repeat this exercise 5-10 times with each hand. Straight fist Stand or sit with your arm, hand, and all five fingers pointed straight up. Make sure to keep your wrist straight during the exercise. Gently bend your fingers at the big knuckle, where your fingers meet your hand, and the middle knuckle. Keep the knuckle at the tips of your fingers straight and try to touch the bottom of your palm. Hold this position for 3 seconds. Extend your fingers back to the starting position, stretching every joint fully. Repeat this exercise 5-10 times with each hand. Tabletop Stand or sit with your arm, hand, and all five fingers pointed straight up. Make sure to keep your wrist straight during the exercise. Gently bend your fingers at the big knuckle, where your fingers meet your hand, as far down as you can while keeping the small knuckles in your fingers straight. Think of forming a tabletop with your fingers. Hold this position for 3 seconds. Extend your fingers back to the starting position, stretching every joint fully. Repeat this exercise 5-10 times with each hand. Finger spread Place your hand flat on a table with your palm facing down. Make sure your wrist stays straight as you do this exercise. Spread your fingers  and thumb apart from each other as far as you can until you feel a gentle stretch. Hold this position for 3 seconds. Bring your fingers and thumb tight together again. Hold this position for 3 seconds. Repeat this exercise 5-10 times with each hand. Making circles Stand or sit with your arm, hand, and all five fingers pointed straight up. Make sure to keep your wrist straight during the  exercise. Make a circle by touching the tip of your thumb to the tip of your index finger. Hold for 3 seconds. Then open your hand wide. Repeat this motion with your thumb and each finger on your hand. Repeat this exercise 5-10 times with each hand. Thumb motion Sit with your forearm resting on a table and your wrist straight. Your thumb should be facing up toward the ceiling. Keep your fingers relaxed as you move your thumb. Lift your thumb up as high as you can toward the ceiling. Hold for 3 seconds. Bend your thumb across your palm as far as you can, reaching the tip of your thumb for the small finger (pinkie) side of your palm. Hold for 3 seconds. Repeat this exercise 5-10 times with each hand. Grip strengthening  Hold a stress ball or other soft ball in the middle of your hand. Slowly increase the pressure, squeezing the ball as much as you can without causing pain. Think of bringing the tips of your fingers into the middle of your palm. All of your finger joints should bend when doing this exercise. Hold your squeeze for 3 seconds, then relax. Repeat this exercise 5-10 times with each hand. Contact a health care provider if: Your hand pain or discomfort gets much worse when you do an exercise. Your hand pain or discomfort does not improve within 2 hours after you exercise. If you have any of these problems, stop doing these exercises right away. Do not do them again unless your health care provider says that you can. Get help right away if: You develop sudden, severe hand pain or swelling. If this happens, stop doing these exercises right away. Do not do them again unless your health care provider says that you can. Make sure you discuss any questions you have with your health care provider. Document Revised: 08/02/2018 Document Reviewed: 04/12/2018 Elsevier Patient Education  [place].

## 2021-09-13 NOTE — Progress Notes (Signed)
Subjective:   Chief Complaint  Patient presents with   Pre-op Exam    Amber Mccall  is here for a Pre-operative physical at the request of Dr. Erlinda Hong. She  is having R TKA surgery on 10/18/21 for R knee OA.  The patient is here with a friend who helps her interpret ([place]).  Personal or family hx of adverse outcome to anesthesia? No  Chipped, cracked, missing, or loose teeth? No  Decreased ROM of neck? No  Able to walk up 2 flights of stairs without becoming significantly short of breath or having chest pain? Yes   Revised Goldman Criteria: High Risk Surgery (intraperitoneal, intrathoracic, aortic): No  Ischemic heart disease (Prior MI, +excercise stress test, angina, nitrate use, Qwave): No  History of heart failure: No  History of cerebrovascular disease: No  History of diabetes: No  Insulin therapy for DM: No  Preoperative Cr >2.0:  no   Right shoulder pain persist.  1 month ago she was seen and recommended physical therapy.  She has had 4 sessions with no improvement.  No new worsening.  Still having pain in the right shoulder region.  Range of motion is about the same.  No redness, bruising, or swelling.  Patient Active Problem List   Diagnosis Date Noted   Chronic right shoulder pain 08/18/2021   Adhesive capsulitis of right shoulder 08/18/2021   Gastroesophageal reflux disease 08/18/2021   Primary osteoarthritis of left knee 07/27/2021   Primary osteoarthritis involving multiple joints 07/13/2021   Strain of right trapezius muscle 07/13/2021   Essential hypertension 05/19/2021   Hypertriglyceridemia 05/19/2021   Osteoporosis 05/19/2021   Lumbar radiculopathy 05/19/2021   Osteoarthritis 05/19/2021   Depression, major, single episode, in partial remission ([place]) 05/19/2021   Chronic bilateral low back pain 05/19/2021   Status post left foot surgery 04/23/2015   Injury of foot, left 04/08/2015   Sprain of ankle 04/08/2015   Deformity of metatarsal bone of left foot 04/08/2015    Metatarsalgia of left foot 02/10/2015   Tenosynovitis of foot 02/10/2015   Chronic pain of both knees 02/10/2015   Closed fracture of metatarsal bone 10/21/2014   Edema of left foot 10/21/2014   Closed fracture of metatarsal bone(s) 01/30/2013   Pain, foot 01/30/2013   Abnormal foot finding 01/30/2013   Past Medical History:  Diagnosis Date   Closed fracture of metatarsal bone(s) 01/30/2013   Essential hypertension    Lumbar radiculopathy    Osteoarthritis    Osteoporosis     Past Surgical History:  Procedure Laterality Date   Cotton Osteotomy w/ Bone Graft Left 04/16/2015   Left foot   IR RADIOLOGIST EVAL & MGMT  04/11/2017   OPEN REDUCTION INTERNAL FIXATION (ORIF) DISTAL PHALANX Left 10/23/2014   5th Metatarsal    Current Outpatient Medications  Medication Sig Dispense Refill   amLODipine (NORVASC) 10 MG tablet TAKE 1 TABLET(10 MG) BY MOUTH DAILY     calcium citrate (CALCITRATE - DOSED IN MG ELEMENTAL CALCIUM) 950 (200 Ca) MG tablet Take 1 tablet by mouth daily.     diclofenac Sodium (VOLTAREN) 1 % GEL Apply 4 g topically 4 (four) times daily. 100 g 2   gabapentin (NEURONTIN) 100 MG capsule TAKE 1 CAPSULE(100 MG) BY MOUTH AT BEDTIME     HYDROcodone-acetaminophen (NORCO/VICODIN) 5-325 MG tablet Take 1 tablet by mouth every 6 (six) hours as needed.     Omega-3 1000 MG CAPS Take 1 capsule by mouth daily.     pantoprazole (PROTONIX) 40 MG  tablet Take 1 tablet (40 mg total) by mouth daily. 30 tablet 1   PROLENSA 0.07 % SOLN Place 1 drop into the right eye at bedtime.     sertraline (ZOLOFT) 50 MG tablet Take by mouth.     tiZANidine (ZANAFLEX) 4 MG tablet TAKE 1 TABLET(4 MG) BY MOUTH EVERY 8 HOURS AS NEEDED FOR MUSCULOSKELETAL PAIN     traMADol (ULTRAM) 50 MG tablet Take 1-2 tablets every 6 hours as needed for pain; do not take more than 8 doses in 24 hours     traZODone (DESYREL) 100 MG tablet Take 100 mg by mouth at bedtime.     VITAMIN D, CHOLECALCIFEROL, PO Take 1 capsule by  mouth daily.     No Known Allergies   History reviewed. No pertinent family history.   Review of Systems:  Constitutional:  no fevers Eye:  no recent significant change in vision Ear:  no hearing loss Nose/Mouth/Throat:  No dental complaints Neck/Thyroid:  no lumps or masses Pulmonary:  No shortness of breath Cardiovascular:  no chest pain Gastrointestinal:  no abdominal pain GU:  negative for dysuria Musculoskeletal/Extremities:  no new pain Skin/Integumentary ROS:  no abnormal skin lesions reported Neurologic:  no HA   Objective:   Vitals:   09/13/21 1016  BP: 120/70  Pulse: 68  Temp: 98.2 F (36.8 C)  TempSrc: Oral  SpO2: 96%  Weight: 142 lb 8 oz (64.6 kg)  Height: '4\' 9"'$  (1.448 m)   Body mass index is 30.84 kg/m.  General:  well developed, well nourished, in no apparent distress Skin:  warm, no pallor or diaphoresis Head:  normocephalic, atraumatic Eyes:  pupils equal and round, sclera anicteric without injection Ears:  canals without lesions, TMs shiny without retraction, no obvious effusion, no erythema Throat/Pharynx:  lips and gingiva without lesion; tongue and uvula midline; non-inflamed pharynx; no exudates or postnasal drainage Neck: neck supple without adenopathy, thyromegaly, or masses, no bruits, no jugular venous distention Lungs:  clear to auscultation, breath sounds equal bilaterally, no respiratory distress Cardio:  regular rate and rhythm without murmurs Abdomen:  abdomen soft, nontender; bowel sounds normal; no masses, hepatomegaly or splenomegaly Musculoskeletal:  symmetrical muscle groups noted without atrophy or deformity Extremities:  no clubbing, cyanosis, or edema, no deformities, no skin discoloration Neuro:  gait normal; deep tendon reflexes normal and symmetric and alert and oriented to person, place, and time Psych: Age appropriate judgment and insight; normal mood  Procedure note; trigger point injection of R trapezius Verbal consent  obtained. The areas of interests were demarcated with an otoscope speculum tip.  There are 3 locations on the right trapezius that were identified. There were then cleaned with alcohol. 1 mL of a mixture of 1:2 Depo-Medrol: Lidocaine 1% without was injected at the 3 locations. 40 mg total of Depo-Medrol was used The areas were then bandaged. There were no complications noted. The patient tolerated the procedure well.  Assessment:   Pre-op exam  Trigger point of right shoulder region - Plan: methylPREDNISolone acetate (DEPO-MEDROL) injection 40 mg, Inject trigger point, 1 or 2   Plan:   Form for surgery completed and to be faxed to her surgeon's office. The patient is deemed low cardiac risk for the proposed procedure. Revised Goldman Criteria - risk for major cardiac death: No risk factors -- 0.4 percent Chronic, uncontrolled. Cont PT. Trigger point injections today. Refer sports med to a DO to consider OMM if no improvement.  F/u as originally scheduled.  The patient  and her friend voiced understanding and agreement to the plan.  [place], DO 09/13/21  12:06 PM

## 2021-09-19 ENCOUNTER — Other Ambulatory Visit: Payer: Self-pay | Admitting: Family Medicine

## 2021-09-28 ENCOUNTER — Encounter: Payer: Self-pay | Admitting: Physical Medicine & Rehabilitation

## 2021-09-28 ENCOUNTER — Encounter: Payer: Medicare Other | Attending: Physical Medicine & Rehabilitation | Admitting: Physical Medicine & Rehabilitation

## 2021-09-28 VITALS — BP 144/81 | HR 79 | Ht <= 58 in | Wt 141.0 lb

## 2021-09-28 DIAGNOSIS — M25511 Pain in right shoulder: Secondary | ICD-10-CM | POA: Insufficient documentation

## 2021-09-28 DIAGNOSIS — M47816 Spondylosis without myelopathy or radiculopathy, lumbar region: Secondary | ICD-10-CM | POA: Diagnosis not present

## 2021-09-28 DIAGNOSIS — G8929 Other chronic pain: Secondary | ICD-10-CM | POA: Diagnosis not present

## 2021-09-28 DIAGNOSIS — M5416 Radiculopathy, lumbar region: Secondary | ICD-10-CM | POA: Diagnosis not present

## 2021-09-28 NOTE — Progress Notes (Signed)
Subjective:    Patient ID: Amber Mccall, female    DOB: 1951/01/29, 71 y.o.   MRN: [id number]  HPI 58 year old [place] female here with a friend who interprets.  Her friend is a retired Marine scientist.  The patient has 2 primary complaints, 1 is chronic low back pain.  She does have a history of lumbar surgery which was performed in 1993 in [place].  Because of left lower extremity numbness and weakness at the ankle which has been present for 20 years, an MRI was performed.  This was reviewed with the patient today.  This demonstrated clumping of nerve roots, scar adhesion at the laminectomy site left L4-5 correlating with the side and location of her weakness.  This was discussed with the patient. In addition lumbar spondylosis was noted in the lower lumbar levels. Secondary complaint today is right shoulder pain.  The pain occurs when she lifts her arm over her head.  She also has some right-sided neck pain which did not respond very well to trigger point injections to the right trapezius performed at primary care office Pain Inventory Average Pain 8 Pain Right Now 8 My pain is constant, sharp, dull, tingling, and aching  In the last 24 hours, has pain interfered with the following? General activity 9 Relation with others 0 Enjoyment of life 0 What TIME of day is your pain at its worst? morning  Sleep (in general) Fair  Pain is worse with: walking, bending, standing, and some activites Pain improves with: therapy/exercise and medication Relief from Meds: 4  No family history on file. Social History   Socioeconomic History   Marital status: Widowed    Spouse name: Not on file   Number of children: Not on file   Years of education: Not on file   Highest education level: Not on file  Occupational History   Not on file  Tobacco Use   Smoking status: Never   Smokeless tobacco: Never  Vaping Use   Vaping Use: Never used  Substance and Sexual Activity   Alcohol use: Not Currently   Drug use:  Not Currently   Sexual activity: Not on file  Other Topics Concern   Not on file  Social History Narrative   Not on file   Social Determinants of Health   Financial Resource Strain: Not on file  Food Insecurity: Not on file  Transportation Needs: Not on file  Physical Activity: Not on file  Stress: Not on file  Social Connections: Not on file   Past Surgical History:  Procedure Laterality Date   Cotton Osteotomy w/ Bone Graft Left 04/16/2015   Left foot   IR RADIOLOGIST EVAL & MGMT  04/11/2017   OPEN REDUCTION INTERNAL FIXATION (ORIF) DISTAL PHALANX Left 10/23/2014   5th Metatarsal   Past Surgical History:  Procedure Laterality Date   Cotton Osteotomy w/ Bone Graft Left 04/16/2015   Left foot   IR RADIOLOGIST EVAL & MGMT  04/11/2017   OPEN REDUCTION INTERNAL FIXATION (ORIF) DISTAL PHALANX Left 10/23/2014   5th Metatarsal   Past Medical History:  Diagnosis Date   Closed fracture of metatarsal bone(s) 01/30/2013   Essential hypertension    Lumbar radiculopathy    Osteoarthritis    Osteoporosis    BP (!) 144/81   Pulse 79   Ht '4\' 9"'$  (1.448 m)   Wt 141 lb (64 kg)   SpO2 97%   BMI 30.51 kg/m   Opioid Risk Score:   Fall Risk Score:  `  1  Depression screen PHQ 2/9     07/13/2021    2:28 PM 05/19/2021    1:40 PM  Depression screen PHQ 2/9  Decreased Interest 0 0  Down, Depressed, Hopeless 0 0  PHQ - 2 Score 0 0     Review of Systems  Musculoskeletal:  Positive for back pain.  All other systems reviewed and are negative.     Objective:   Physical Exam Vitals and nursing note reviewed.  Constitutional:      Appearance: She is normal weight.  HENT:     Head: Normocephalic and atraumatic.  Eyes:     Extraocular Movements: Extraocular movements intact.     Conjunctiva/sclera: Conjunctivae normal.     Pupils: Pupils are equal, round, and reactive to light.  Neurological:     Mental Status: She is alert.     Comments: Motor strength is 5/5 bilateral  deltoid by stress of grip hip flexor knee extensor Left ankle dorsiflexors 3 - ankle plantar flexor is 4 - right ankle dorsiflexor plantar flexors 5/5 Sensation reduced at the left L4 L5-S1 dermatome distribution. Back has mild tenderness to palpation in the lumbar paraspinal area this is exacerbated by lumbar extension as well as bending to the side Right shoulder has positive impingement sign at around 120 degrees, mild reduction in abduction and flexion, moderately diminished external rotation right shoulder  Psychiatric:        Mood and Affect: Mood normal.        Behavior: Behavior normal.          Assessment & Plan:    Lumbar pain with chronic left L4,5,S1 radiculopathy.  As discussed with the patient given that this weakness has been present for 20 years unlikely that surgical decompression is going to help in any way. In regards to her low back pain, she may benefit from lumbar medial branch blocks as a means of evaluating pain generator and evaluating the potential benefit or radiofrequency neurotomy of the same nerves.  Would do bilateral L3-L4-L5 medial branch blocks Right shoulder pain likely multifactorial has myofascial componenet as well as impingement syndrome and moderate adhesive capsulitis which has not responded well to PT Shoulder injectionRight  Without ultrasound guidance)  Indication: Right shoulder pain not relieved by medication management and other conservative care.  Informed consent was obtained after describing risks and benefits of the procedure with the patient, this includes bleeding, bruising, infection and medication side effects. The patient wishes to proceed and has given written consent. Patient was placed in a seated position. The right shoulder was marked and prepped with betadine in the subacromial area. A 25-gauge 1-1/2 inch needle was inserted into the subacromial area. After negative draw back for blood, a solution containing 1 mL of 6 mg per ML  betamethasone and 4 mL of 1% lidocaine was injected. A band aid was applied. The patient tolerated the procedure well. Post procedure instructions were given.

## 2021-09-28 NOTE — Patient Instructions (Signed)
Will do back injection next visit  Today we did a left shoulder joint injection with cortisone and lidocaine

## 2021-09-29 ENCOUNTER — Ambulatory Visit (INDEPENDENT_AMBULATORY_CARE_PROVIDER_SITE_OTHER): Payer: Medicare Other | Admitting: Gastroenterology

## 2021-09-29 ENCOUNTER — Encounter: Payer: Self-pay | Admitting: Gastroenterology

## 2021-09-29 VITALS — BP 160/84 | HR 90 | Ht <= 58 in | Wt 141.0 lb

## 2021-09-29 DIAGNOSIS — Z1211 Encounter for screening for malignant neoplasm of colon: Secondary | ICD-10-CM | POA: Diagnosis not present

## 2021-09-29 DIAGNOSIS — R112 Nausea with vomiting, unspecified: Secondary | ICD-10-CM | POA: Diagnosis not present

## 2021-09-29 DIAGNOSIS — K219 Gastro-esophageal reflux disease without esophagitis: Secondary | ICD-10-CM | POA: Diagnosis not present

## 2021-09-29 DIAGNOSIS — M8588 Other specified disorders of bone density and structure, other site: Secondary | ICD-10-CM | POA: Diagnosis not present

## 2021-09-29 DIAGNOSIS — Z7189 Other specified counseling: Secondary | ICD-10-CM | POA: Diagnosis not present

## 2021-09-29 DIAGNOSIS — E673 Hypervitaminosis D: Secondary | ICD-10-CM | POA: Diagnosis not present

## 2021-09-29 DIAGNOSIS — Z1212 Encounter for screening for malignant neoplasm of rectum: Secondary | ICD-10-CM

## 2021-09-29 DIAGNOSIS — Z780 Asymptomatic menopausal state: Secondary | ICD-10-CM | POA: Diagnosis not present

## 2021-09-29 DIAGNOSIS — Z8781 Personal history of (healed) traumatic fracture: Secondary | ICD-10-CM | POA: Diagnosis not present

## 2021-09-29 DIAGNOSIS — M810 Age-related osteoporosis without current pathological fracture: Secondary | ICD-10-CM | POA: Diagnosis not present

## 2021-09-29 MED ORDER — PANTOPRAZOLE SODIUM 40 MG PO TBEC: 40.0000 mg | DELAYED_RELEASE_TABLET | Freq: Two times a day (BID) | ORAL | 0 refills | Status: DC

## 2021-09-29 NOTE — Progress Notes (Signed)
Chief Complaint: for GI WU  Referring Provider:  Wendling, Nicholas Chatham;   #1. GERD with occ N/V  #2. Occ diarrhea/ CRC screening  Plan: -EGD/colon with miralax -Increase protonix '40mg'$  po BID x 4weeks, then QD   I discussed EGD/Colonoscopy- the indications, risks, alternatives and potential complications including, but not limited to bleeding, infection, reaction to meds, damage to internal organs, cardiac and/or pulmonary problems, and perforation requiring surgery. The possibility that significant findings could be missed was explained. All ? were answered. Pt consents to proceed. HPI:    Amber Mccall is a 71 y.o. female  [place]  Son is the interpreter having R TKA surgery on 10/18/21 for R knee OA, HTN, LBP d/t DJD, anxiety/depression  GERD x 1 year with intermittent N/V, atypical chest pain. 50% better with protonix once daily.  No odynophagia or dysphagia.  Worse with fried foods, stress/anxiety.  No jaundice dark urine or pale stools.  No abdominal pain.  Occ diarrhea, fecal incontinence while having diarrhea, taking imodium prn, then would not have bowel movements for 2 days.  No melena or hematochezia.  Had colonoscopy over 12 years ago in [facility] which was negative.  Advised to get it done in 10 years.  Had EGD more than 6 years ago and was told that she has gastritis.  No records available currently   Advised to get EGD/colonoscopy.  No sodas, chocolates, chewing gums, artificial sweeteners and candy. No NSAIDs  No wt loss   Past Medical History:  Diagnosis Date   Closed fracture of metatarsal bone(s) 01/30/2013   Essential hypertension    Lumbar radiculopathy    Osteoarthritis    Osteoporosis     Past Surgical History:  Procedure Laterality Date   ABDOMINAL HYSTERECTOMY     APPENDECTOMY     [place] Osteotomy w/ Bone Graft Left 04/16/2015   Left foot   IR RADIOLOGIST EVAL & MGMT  04/11/2017   OPEN REDUCTION INTERNAL  FIXATION (ORIF) DISTAL PHALANX Left 10/23/2014   5th Metatarsal    Family History  Problem Relation Age of Onset   Colon cancer Neg Hx    Rectal cancer Neg Hx    Stomach cancer Neg Hx     Social History   Tobacco Use   Smoking status: Never   Smokeless tobacco: Never  Vaping Use   Vaping Use: Never used  Substance Use Topics   Alcohol use: Not Currently   Drug use: Not Currently    Current Outpatient Medications  Medication Sig Dispense Refill   amLODipine (NORVASC) 10 MG tablet TAKE 1 TABLET(10 MG) BY MOUTH DAILY     calcium citrate (CALCITRATE - DOSED IN MG ELEMENTAL CALCIUM) 950 (200 Ca) MG tablet Take 1 tablet by mouth daily.     diclofenac Sodium (VOLTAREN) 1 % GEL Apply 4 g topically 4 (four) times daily. 100 g 2   gabapentin (NEURONTIN) 100 MG capsule TAKE 1 CAPSULE(100 MG) BY MOUTH AT BEDTIME     HYDROcodone-acetaminophen (NORCO/VICODIN) 5-325 MG tablet Take 1 tablet by mouth every 6 (six) hours as needed.     loperamide (IMODIUM) 2 MG capsule Take by mouth as needed for diarrhea or loose stools.     pantoprazole (PROTONIX) 40 MG tablet TAKE 1 TABLET(40 MG) BY MOUTH DAILY 90 tablet 0   sertraline (ZOLOFT) 50 MG tablet Take by mouth.     tiZANidine (ZANAFLEX) 4 MG tablet TAKE 1 TABLET(4 MG) BY  MOUTH EVERY 8 HOURS AS NEEDED FOR MUSCULOSKELETAL PAIN     traMADol (ULTRAM) 50 MG tablet Take 1-2 tablets every 6 hours as needed for pain; do not take more than 8 doses in 24 hours     traZODone (DESYREL) 100 MG tablet Take 100 mg by mouth at bedtime.     VITAMIN D, CHOLECALCIFEROL, PO Take 1 capsule by mouth daily.     No current facility-administered medications for this visit.    No Known Allergies  Review of Systems:  Constitutional: Denies fever, chills, diaphoresis, appetite change and fatigue.  HEENT: Denies photophobia, eye pain, redness, hearing loss, ear pain, congestion, sore throat, rhinorrhea, sneezing, mouth sores, neck pain, neck stiffness and tinnitus.    Respiratory: Denies SOB, DOE, cough, chest tightness,  and wheezing.   Cardiovascular: Denies chest pain, palpitations and leg swelling.  Genitourinary: Denies dysuria, urgency, frequency, hematuria, flank pain and difficulty urinating.  Musculoskeletal: has myalgias, back pain, joint swelling, arthralgias and gait problem.  Skin: No rash.  Neurological: Denies dizziness, seizures, syncope, weakness, light-headedness, numbness and headaches.  Hematological: Denies adenopathy. Easy bruising, personal or family bleeding history  Psychiatric/Behavioral: has anxiety or depression     Physical Exam:    BP (!) 160/84   Pulse 90   Ht '4\' 9"'$  (1.448 m)   Wt 141 lb (64 kg)   SpO2 97%   BMI 30.51 kg/m  Wt Readings from Last 3 Encounters:  09/29/21 141 lb (64 kg)  09/28/21 141 lb (64 kg)  09/13/21 142 lb 8 oz (64.6 kg)   Constitutional:  Well-developed, in no acute distress. Psychiatric: Normal mood and affect. Behavior is normal. HEENT: Pupils normal.  Conjunctivae are normal. No scleral icterus. Cardiovascular: Normal rate, regular rhythm. No edema Pulmonary/chest: Effort normal and breath sounds normal. No wheezing, rales or rhonchi. Abdominal: Soft, nondistended. Nontender. Bowel sounds active throughout. There are no masses palpable. No hepatomegaly. Rectal: Deferred Neurological: Alert and oriented to person place and time. Skin: Skin is warm and dry. No rashes noted.  Data Reviewed: I have personally reviewed following labs and imaging studies  CBC:    Latest Ref Rng & Units 05/19/2021    2:20 PM  CBC  WBC 4.0 - 10.5 K/uL 6.3    Hemoglobin 12.0 - 15.0 g/dL 12.5    Hematocrit 36.0 - 46.0 % 38.1    Platelets 150.0 - 400.0 K/uL 270.0      CMP:    Latest Ref Rng & Units 05/19/2021    2:20 PM  CMP  Glucose 70 - 99 mg/dL 95    BUN 6 - 23 mg/dL 15    Creatinine 0.40 - 1.20 mg/dL 0.72    Sodium 135 - 145 mEq/L 143    Potassium 3.5 - 5.1 mEq/L 4.2    Chloride 96 - 112 mEq/L  105    CO2 19 - 32 mEq/L 28    Calcium 8.4 - 10.5 mg/dL 8.8    Total Protein 6.0 - 8.3 g/dL 7.0    Total Bilirubin 0.2 - 1.2 mg/dL 0.5    Alkaline Phos 39 - 117 U/L 46    AST 0 - 37 U/L 16    ALT 0 - 35 U/L 6         Carmell Austria, MD 09/29/2021, 10:42 AM  Cc: Shelda Pal*

## 2021-09-29 NOTE — Patient Instructions (Signed)
If you are age 70 or older, your body mass index should be between 23-30. Your Body mass index is 30.51 kg/m. If this is out of the aforementioned range listed, please consider follow up with your Primary Care Provider.  If you are age 60 or younger, your body mass index should be between 19-25. Your Body mass index is 30.51 kg/m. If this is out of the aformentioned range listed, please consider follow up with your Primary Care Provider.   ________________________________________________________  The [facility] providers would like to encourage you to use [facility] to communicate with providers for non-urgent requests or questions.  Due to long hold times on the telephone, sending your provider a message by [facility] may be a faster and more efficient way to get a response.  Please allow 48 business hours for a response.  Please remember that this is for non-urgent requests.  _______________________________________________________  We have sent the following medications to your pharmacy for you to pick up at your convenience: Protonix '40mg'$  2 times a day for 1 month then decrease to once a day  You have been scheduled for an endoscopy and colonoscopy. Please follow the written instructions given to you at your visit today. Please pick up your prep supplies at the pharmacy within the next 1-3 days. If you use inhalers (even only as needed), please bring them with you on the day of your procedure.  Thank you,  Dr. Jackquline Denmark

## 2021-10-03 ENCOUNTER — Encounter: Payer: Self-pay | Admitting: Certified Registered Nurse Anesthetist

## 2021-10-07 ENCOUNTER — Ambulatory Visit (AMBULATORY_SURGERY_CENTER): Payer: Medicare Other | Admitting: Gastroenterology

## 2021-10-07 ENCOUNTER — Encounter: Payer: Self-pay | Admitting: Gastroenterology

## 2021-10-07 VITALS — BP 145/76 | HR 60 | Temp 98.0°F | Resp 15 | Ht <= 58 in | Wt 141.0 lb

## 2021-10-07 DIAGNOSIS — K3189 Other diseases of stomach and duodenum: Secondary | ICD-10-CM | POA: Diagnosis not present

## 2021-10-07 DIAGNOSIS — D125 Benign neoplasm of sigmoid colon: Secondary | ICD-10-CM | POA: Diagnosis not present

## 2021-10-07 DIAGNOSIS — I10 Essential (primary) hypertension: Secondary | ICD-10-CM | POA: Diagnosis not present

## 2021-10-07 DIAGNOSIS — Z1211 Encounter for screening for malignant neoplasm of colon: Secondary | ICD-10-CM

## 2021-10-07 DIAGNOSIS — K317 Polyp of stomach and duodenum: Secondary | ICD-10-CM | POA: Diagnosis not present

## 2021-10-07 DIAGNOSIS — K219 Gastro-esophageal reflux disease without esophagitis: Secondary | ICD-10-CM | POA: Diagnosis not present

## 2021-10-07 DIAGNOSIS — Z1212 Encounter for screening for malignant neoplasm of rectum: Secondary | ICD-10-CM

## 2021-10-07 DIAGNOSIS — D120 Benign neoplasm of cecum: Secondary | ICD-10-CM | POA: Diagnosis not present

## 2021-10-07 DIAGNOSIS — K529 Noninfective gastroenteritis and colitis, unspecified: Secondary | ICD-10-CM | POA: Diagnosis not present

## 2021-10-07 DIAGNOSIS — K259 Gastric ulcer, unspecified as acute or chronic, without hemorrhage or perforation: Secondary | ICD-10-CM

## 2021-10-07 MED ORDER — PANTOPRAZOLE SODIUM 40 MG PO TBEC: 40.0000 mg | DELAYED_RELEASE_TABLET | Freq: Every day | ORAL | 11 refills | Status: DC

## 2021-10-07 MED ORDER — SODIUM CHLORIDE 0.9 % IV SOLN: 500.0000 mL | Freq: Once | INTRAVENOUS | Status: DC

## 2021-10-07 NOTE — Op Note (Signed)
[facility] Patient Name: Amber Mccall Procedure Date: 10/07/2021 9:20 AM MRN: [id number] Endoscopist: Jackquline Denmark , MD Age: 71 Referring MD:  Date of Birth: Apr 08, 1951 Gender: Female Account #: [id number] Procedure:                Upper GI endoscopy Indications:              GERD Medicines:                Monitored Anesthesia Care Procedure:                Pre-Anesthesia Assessment:                           - Prior to the procedure, a History and Physical                            was performed, and patient medications and                            allergies were reviewed. The patient's tolerance of                            previous anesthesia was also reviewed. The risks                            and benefits of the procedure and the sedation                            options and risks were discussed with the patient.                            All questions were answered, and informed consent                            was obtained. Prior Anticoagulants: The patient has                            taken no previous anticoagulant or antiplatelet                            agents. ASA Grade Assessment: II - A patient with                            mild systemic disease. After reviewing the risks                            and benefits, the patient was deemed in                            satisfactory condition to undergo the procedure.                           After obtaining informed consent, the endoscope was  passed under direct vision. Throughout the                            procedure, the patient's blood pressure, pulse, and                            oxygen saturations were monitored continuously. The                            GIF [id number] #[id number] was introduced through the                            mouth, and advanced to the second part of duodenum.                            The upper GI endoscopy was accomplished without                             difficulty. The patient tolerated the procedure                            well. Scope In: Scope Out: Findings:                 The examined esophagus was normal with well-defined                            Z-line at 35 cm.                           One non-bleeding cratered gastric ulcer with no                            stigmata of bleeding was found in the gastric                            antrum. The lesion was 10 mm in largest dimension.                            Biopsies were taken with a cold forceps for                            histology.                           Minimal gastritis in the body of the stomach.                            Multiple Biopsies were taken with a cold forceps                            for histology from antrum, body and fundus ([place]                            protocol).  The examined duodenum was normal. Biopsies for                            histology were taken with a cold forceps for                            evaluation of celiac disease. Complications:            No immediate complications. Estimated Blood Loss:     Estimated blood loss: none. Impression:               - Non-bleeding gastric ulcer with no stigmata of                            bleeding. Biopsied.                           - Mild gastritis. Recommendation:           - Patient has a contact number available for                            emergencies. The signs and symptoms of potential                            delayed complications were discussed with the                            patient. Return to normal activities tomorrow.                            Written discharge instructions were provided to the                            patient.                           - Resume previous diet.                           - Continue present medications Including Protonix                            40 mg p.o. once a day #90, 11 RF.                            - No aspirin, ibuprofen, naproxen, or other                            non-steroidal anti-inflammatory drugs.                           - Await pathology results.                           - Repeat upper endoscopy in appox 12 weeks to check  healing.                           - The findings and recommendations were discussed                            with the patient's family. Jackquline Denmark, MD 10/07/2021 9:56:30 AM This report has been signed electronically.

## 2021-10-07 NOTE — Progress Notes (Signed)
Called to room to assist during endoscopic procedure.  Patient ID and intended procedure confirmed with present staff. Received instructions for my participation in the procedure from the performing physician.  

## 2021-10-07 NOTE — Op Note (Signed)
[facility] Patient Name: Amber Mccall Procedure Date: 10/07/2021 9:19 AM MRN: [id number] Endoscopist: Jackquline Denmark , MD Age: 71 Referring MD:  Date of Birth: 01-23-1951 Gender: Female Account #: [id number] Procedure:                Colonoscopy Indications:              Screening for colorectal malignant neoplasm Medicines:                Monitored Anesthesia Care Procedure:                Pre-Anesthesia Assessment:                           - Prior to the procedure, a History and Physical                            was performed, and patient medications and                            allergies were reviewed. The patient's tolerance of                            previous anesthesia was also reviewed. The risks                            and benefits of the procedure and the sedation                            options and risks were discussed with the patient.                            All questions were answered, and informed consent                            was obtained. Prior Anticoagulants: The patient has                            taken no previous anticoagulant or antiplatelet                            agents. ASA Grade Assessment: II - A patient with                            mild systemic disease. After reviewing the risks                            and benefits, the patient was deemed in                            satisfactory condition to undergo the procedure.                           After obtaining informed consent, the colonoscope  was passed under direct vision. Throughout the                            procedure, the patient's blood pressure, pulse, and                            oxygen saturations were monitored continuously. The                            Olympus [id number]-[id number] (#[id number]) Colonoscope was                            introduced through the anus and advanced to the 2                            cm into the ileum. The  colonoscopy was performed                            without difficulty. The patient tolerated the                            procedure well. The quality of the bowel                            preparation was good except in the cecum where                            there was solid vegetable material which could not                            be fully aspirated. The terminal ileum, ileocecal                            valve, appendiceal orifice, and rectum were                            photographed. Scope In: 9:38:42 AM Scope Out: 9:52:26 AM Scope Withdrawal Time: 0 hours 10 minutes 3 seconds  Total Procedure Duration: 0 hours 13 minutes 44 seconds  Findings:                 Two sessile polyps were found in the proximal                            sigmoid colon and cecum. The polyps were 4 to 6 mm                            in size. These polyps were removed with a cold                            snare. Resection and retrieval were complete.                           Non-bleeding external and internal  hemorrhoids were                            found during retroflexion and during perianal exam.                            The hemorrhoids were small.                           The terminal ileum appeared normal.                           The exam was otherwise without abnormality on                            direct and retroflexion views. Complications:            No immediate complications. Estimated Blood Loss:     Estimated blood loss: none. Impression:               - Two 4 to 6 mm polyps in the proximal sigmoid                            colon and in the cecum, removed with a cold snare.                            Resected and retrieved.                           - Non-bleeding external and internal hemorrhoids.                           - The examined portion of the ileum was normal.                           - The examination was otherwise normal on direct                             and retroflexion views. Recommendation:           - Patient has a contact number available for                            emergencies. The signs and symptoms of potential                            delayed complications were discussed with the                            patient. Return to normal activities tomorrow.                            Written discharge instructions were provided to the                            patient.                           -  Resume previous diet.                           - Continue present medications.                           - Await pathology results.                           - Repeat colonoscopy for surveillance based on                            pathology results.                           - The findings and recommendations were discussed                            with the patient's family. Jackquline Denmark, MD 10/07/2021 9:59:26 AM This report has been signed electronically.

## 2021-10-07 NOTE — Progress Notes (Signed)
0920 Robinul 0.1 mg IV given due large amount of secretions upon assessment.  MD made aware, vss

## 2021-10-07 NOTE — Progress Notes (Signed)
Recall entered for 12 week repeat EGD: # [id number]  Discharge instructions reviewed with patient and son, Soom. All questions answered.

## 2021-10-07 NOTE — Progress Notes (Signed)
Report given to PACU, vss 

## 2021-10-07 NOTE — Patient Instructions (Signed)
Handouts on gastritis, hemorrhoids, and polyps given to you today  Office to call to schedule repeat EGD in 12 weeks AVOID ASPIRIN, ASPIRIN CONTAINING PRODUCTS (BC OR GOODY POWDERS) OR NSAIDS (IBUPROFEN, ADVIL, ALEVE, AND MOTRIN); TYLENOL IS OK TO TAKE   YOU HAD AN ENDOSCOPIC PROCEDURE TODAY AT [facility]:   Refer to the procedure report that was given to you for any specific questions about what was found during the examination.  If the procedure report does not answer your questions, please call your gastroenterologist to clarify.  If you requested that your care partner not be given the details of your procedure findings, then the procedure report has been included in a sealed envelope for you to review at your convenience later.  YOU SHOULD EXPECT: Some feelings of bloating in the abdomen. Passage of more gas than usual.  Walking can help get rid of the air that was put into your GI tract during the procedure and reduce the bloating. If you had a lower endoscopy (such as a colonoscopy or flexible sigmoidoscopy) you may notice spotting of blood in your stool or on the toilet paper. If you underwent a bowel prep for your procedure, you may not have a normal bowel movement for a few days.  Please Note:  You might notice some irritation and congestion in your nose or some drainage.  This is from the oxygen used during your procedure.  There is no need for concern and it should clear up in a day or so.  SYMPTOMS TO REPORT IMMEDIATELY:  Following lower endoscopy (colonoscopy or flexible sigmoidoscopy):  Excessive amounts of blood in the stool  Significant tenderness or worsening of abdominal pains  Swelling of the abdomen that is new, acute  Fever of 100F or higher  Following upper endoscopy (EGD)  Vomiting of blood or coffee ground material  New chest pain or pain under the shoulder blades  Painful or persistently difficult swallowing  New shortness of breath  Fever of 100F  or higher  Black, tarry-looking stools  For urgent or emergent issues, a gastroenterologist can be reached at any hour by calling [phone]. Do not use MyChart messaging for urgent concerns.    DIET:  We do recommend a small meal at first, but then you may proceed to your regular diet.  Drink plenty of fluids but you should avoid alcoholic beverages for 24 hours.  ACTIVITY:  You should plan to take it easy for the rest of today and you should NOT DRIVE or use heavy machinery until tomorrow (because of the sedation medicines used during the test).    FOLLOW UP: Our staff will call the number listed on your records 24-72 hours following your procedure to check on you and address any questions or concerns that you may have regarding the information given to you following your procedure. If we do not reach you, we will leave a message.  We will attempt to reach you two times.  During this call, we will ask if you have developed any symptoms of COVID 19. If you develop any symptoms (ie: fever, flu-like symptoms, shortness of breath, cough etc.) before then, please call [phone].  If you test positive for Covid 19 in the 2 weeks post procedure, please call and report this information to [place].    If any biopsies were taken you will be contacted by phone or by letter within the next 1-3 weeks.  Please call us at [phone] if you have  not heard about the biopsies in 3 weeks.    SIGNATURES/CONFIDENTIALITY: You and/or your care partner have signed paperwork which will be entered into your electronic medical record.  These signatures attest to the fact that that the information above on your After Visit Summary has been reviewed and is understood.  Full responsibility of the confidentiality of this discharge information lies with you and/or your care-partner.

## 2021-10-07 NOTE — Progress Notes (Signed)
Chief Complaint: for GI WU  Referring Provider:  Wendling, Nicholas Davison;   #1. GERD with occ N/V  #2. Occ diarrhea/ CRC screening  Plan: -EGD/colon with miralax -Increase protonix '40mg'$  po BID x 4weeks, then QD   I discussed EGD/Colonoscopy- the indications, risks, alternatives and potential complications including, but not limited to bleeding, infection, reaction to meds, damage to internal organs, cardiac and/or pulmonary problems, and perforation requiring surgery. The possibility that significant findings could be missed was explained. All ? were answered. Pt consents to proceed. HPI:    Amber Mccall is a 71 y.o. female  [place]  Son is the interpreter having R TKA surgery on 10/18/21 for R knee OA, HTN, LBP d/t DJD, anxiety/depression  GERD x 1 year with intermittent N/V, atypical chest pain. 50% better with protonix once daily.  No odynophagia or dysphagia.  Worse with fried foods, stress/anxiety.  No jaundice dark urine or pale stools.  No abdominal pain.  Occ diarrhea, fecal incontinence while having diarrhea, taking imodium prn, then would not have bowel movements for 2 days.  No melena or hematochezia.  Had colonoscopy over 12 years ago in [facility] which was negative.  Advised to get it done in 10 years.  Had EGD more than 6 years ago and was told that she has gastritis.  No records available currently   Advised to get EGD/colonoscopy.  No sodas, chocolates, chewing gums, artificial sweeteners and candy. No NSAIDs  No wt loss   Past Medical History:  Diagnosis Date   Closed fracture of metatarsal bone(s) 01/30/2013   Essential hypertension    Lumbar radiculopathy    Osteoarthritis    Osteoporosis     Past Surgical History:  Procedure Laterality Date   ABDOMINAL HYSTERECTOMY     APPENDECTOMY     [place] Osteotomy w/ Bone Graft Left 04/16/2015   Left foot   IR RADIOLOGIST EVAL & MGMT  04/11/2017   OPEN REDUCTION INTERNAL  FIXATION (ORIF) DISTAL PHALANX Left 10/23/2014   5th Metatarsal    Family History  Problem Relation Age of Onset   Colon cancer Neg Hx    Rectal cancer Neg Hx    Stomach cancer Neg Hx     Social History   Tobacco Use   Smoking status: Never   Smokeless tobacco: Never  Vaping Use   Vaping Use: Never used  Substance Use Topics   Alcohol use: Not Currently   Drug use: Not Currently    Current Outpatient Medications  Medication Sig Dispense Refill   amLODipine (NORVASC) 10 MG tablet TAKE 1 TABLET(10 MG) BY MOUTH DAILY     calcium citrate (CALCITRATE - DOSED IN MG ELEMENTAL CALCIUM) 950 (200 Ca) MG tablet Take 1 tablet by mouth daily.     diclofenac Sodium (VOLTAREN) 1 % GEL Apply 4 g topically 4 (four) times daily. (Patient taking differently: Apply 4 g topically 4 (four) times daily as needed (pain).) 100 g 2   gabapentin (NEURONTIN) 100 MG capsule TAKE 1 CAPSULE(100 MG) BY MOUTH AT BEDTIME     HYDROcodone-acetaminophen (NORCO/VICODIN) 5-325 MG tablet Take 1 tablet by mouth every 6 (six) hours as needed for moderate pain.     meloxicam (MOBIC) 15 MG tablet Take 15 mg by mouth daily. (Patient not taking: Reported on 10/05/2021)     pantoprazole (PROTONIX) 40 MG tablet TAKE 1 TABLET(40 MG) BY MOUTH DAILY 90 tablet 0   pantoprazole (PROTONIX) 40  MG tablet Take 1 tablet (40 mg total) by mouth 2 (two) times daily. Take 1 tablet 2 times a day for 1 month and then decrease to 1 tablet daily (Patient not taking: Reported on 10/05/2021) 180 tablet 0   sertraline (ZOLOFT) 50 MG tablet Take 50 mg by mouth daily.     tiZANidine (ZANAFLEX) 4 MG tablet Take 4 mg by mouth every 8 (eight) hours as needed for muscle spasms.     traZODone (DESYREL) 100 MG tablet Take 100 mg by mouth at bedtime.     VITAMIN D, CHOLECALCIFEROL, PO Take 1 capsule by mouth daily.     Current Facility-Administered Medications  Medication Dose Route Frequency Provider Last Rate Last Admin   0.9 %  sodium chloride infusion   500 mL Intravenous Once Jackquline Denmark, MD        No Known Allergies  Review of Systems:  Constitutional: Denies fever, chills, diaphoresis, appetite change and fatigue.  HEENT: Denies photophobia, eye pain, redness, hearing loss, ear pain, congestion, sore throat, rhinorrhea, sneezing, mouth sores, neck pain, neck stiffness and tinnitus.   Respiratory: Denies SOB, DOE, cough, chest tightness,  and wheezing.   Cardiovascular: Denies chest pain, palpitations and leg swelling.  Genitourinary: Denies dysuria, urgency, frequency, hematuria, flank pain and difficulty urinating.  Musculoskeletal: has myalgias, back pain, joint swelling, arthralgias and gait problem.  Skin: No rash.  Neurological: Denies dizziness, seizures, syncope, weakness, light-headedness, numbness and headaches.  Hematological: Denies adenopathy. Easy bruising, personal or family bleeding history  Psychiatric/Behavioral: has anxiety or depression     Physical Exam:    BP (!) 145/74   Pulse 71   Temp 98 F (36.7 C) (Temporal)   Ht '4\' 9"'$  (1.448 m)   Wt 141 lb (64 kg)   SpO2 97%   BMI 30.51 kg/m  Wt Readings from Last 3 Encounters:  10/07/21 141 lb (64 kg)  09/29/21 141 lb (64 kg)  09/28/21 141 lb (64 kg)   Constitutional:  Well-developed, in no acute distress. Psychiatric: Normal mood and affect. Behavior is normal. HEENT: Pupils normal.  Conjunctivae are normal. No scleral icterus. Cardiovascular: Normal rate, regular rhythm. No edema Pulmonary/chest: Effort normal and breath sounds normal. No wheezing, rales or rhonchi. Abdominal: Soft, nondistended. Nontender. Bowel sounds active throughout. There are no masses palpable. No hepatomegaly. Rectal: Deferred Neurological: Alert and oriented to person place and time. Skin: Skin is warm and dry. No rashes noted.  Data Reviewed: I have personally reviewed following labs and imaging studies  CBC:    Latest Ref Rng & Units 05/19/2021    2:20 PM  CBC  WBC 4.0  - 10.5 K/uL 6.3   Hemoglobin 12.0 - 15.0 g/dL 12.5   Hematocrit 36.0 - 46.0 % 38.1   Platelets 150.0 - 400.0 K/uL 270.0     CMP:    Latest Ref Rng & Units 05/19/2021    2:20 PM  CMP  Glucose 70 - 99 mg/dL 95   BUN 6 - 23 mg/dL 15   Creatinine 0.40 - 1.20 mg/dL 0.72   Sodium 135 - 145 mEq/L 143   Potassium 3.5 - 5.1 mEq/L 4.2   Chloride 96 - 112 mEq/L 105   CO2 19 - 32 mEq/L 28   Calcium 8.4 - 10.5 mg/dL 8.8   Total Protein 6.0 - 8.3 g/dL 7.0   Total Bilirubin 0.2 - 1.2 mg/dL 0.5   Alkaline Phos 39 - 117 U/L 46   AST 0 - 37 U/L 16  ALT 0 - 35 U/L 6        Carmell Austria, MD 10/07/2021, 9:07 AM  Cc: Shelda Pal*

## 2021-10-07 NOTE — Progress Notes (Signed)
I have reviewed the patient's medical history in detail and updated the computerized patient record.

## 2021-10-08 ENCOUNTER — Telehealth: Payer: Self-pay

## 2021-10-08 NOTE — Telephone Encounter (Signed)
  Follow up Call-     10/07/2021    9:02 AM  Call back number  Post procedure Call Back phone  # [phone]  Permission to leave phone message Yes     Patient questions:  Do you have a fever, pain , or abdominal swelling? No. Pain Score  0 *  Have you tolerated food without any problems? Yes.    Have you been able to return to your normal activities? Yes.    Do you have any questions about your discharge instructions: Diet   No. Medications  No. Follow up visit  No.  Do you have questions or concerns about your Care? Yes.   Spoke with patient's son to obtain this information. States his mom is doing well. He had some questions regarding medications and receiving pathology results which I was able to answer for him and advised him to call  Dr. Steve Rattler office if he has further questions once he received the pathology results.  Actions: * If pain score is 4 or above: No action needed, pain <4.

## 2021-10-11 ENCOUNTER — Other Ambulatory Visit: Payer: Self-pay | Admitting: Physician Assistant

## 2021-10-11 MED ORDER — DOCUSATE SODIUM 100 MG PO CAPS: 100.0000 mg | ORAL_CAPSULE | Freq: Every day | ORAL | 2 refills | Status: DC | PRN

## 2021-10-11 MED ORDER — OXYCODONE-ACETAMINOPHEN 5-325 MG PO TABS: 1.0000 | ORAL_TABLET | Freq: Four times a day (QID) | ORAL | 0 refills | Status: DC | PRN

## 2021-10-11 MED ORDER — ONDANSETRON HCL 4 MG PO TABS: 4.0000 mg | ORAL_TABLET | Freq: Three times a day (TID) | ORAL | 0 refills | Status: DC | PRN

## 2021-10-11 MED ORDER — ASPIRIN 81 MG PO TBEC: 81.0000 mg | DELAYED_RELEASE_TABLET | Freq: Two times a day (BID) | ORAL | 0 refills | Status: DC

## 2021-10-11 MED ORDER — METHOCARBAMOL 750 MG PO TABS: 750.0000 mg | ORAL_TABLET | Freq: Two times a day (BID) | ORAL | 2 refills | Status: DC | PRN

## 2021-10-11 NOTE — Pre-Procedure Instructions (Signed)
Surgical Instructions    Your procedure is scheduled on October 18, 2021.  Report to [facility] Main Entrance "A" at 9:45 A.M., then check in with the Admitting office.  Call this number if you have problems the morning of surgery:  [phone]   If you have any questions prior to your surgery date call [phone]: Open Monday-Friday 8am-4pm    Remember:  Do not eat after midnight the night before your surgery  You may drink clear liquids until 8:45 AM the morning of your surgery.   Clear liquids allowed are: Water, Non-Citrus Juices (without pulp), Carbonated Beverages, Clear Tea, Black Coffee Only (NO MILK, CREAM OR POWDERED CREAMER of any kind), and Gatorade.  Patient Instructions  The night before surgery:  No food after midnight. ONLY clear liquids after midnight  The day of surgery (if you do NOT have diabetes):  Drink ONE (1) Pre-Surgery Clear Ensure by 8:45 AM the morning of surgery. Drink in one sitting. Do not sip.  This drink was given to you during your hospital  pre-op appointment visit.  Nothing else to drink after completing the  Pre-Surgery Clear Ensure.         If you have questions, please contact your surgeon's office.     Take these medicines the morning of surgery with A SIP OF WATER:  amLODipine (NORVASC)   pantoprazole (PROTONIX)  sertraline (ZOLOFT)   Take these medicines the morning of surgery AS NEEDED:  HYDROcodone-acetaminophen (NORCO/VICODIN)  tiZANidine (ZANAFLEX)  As of today, STOP taking any Aspirin (unless otherwise instructed by your surgeon) Aleve, Naproxen, Ibuprofen, Motrin, Advil, Goody's, BC's, all herbal medications, fish oil, and all vitamins. This includes your meloxicam (MOBIC).                     Do NOT Smoke (Tobacco/Vaping) for 24 hours prior to your procedure.  If you use a CPAP at night, you may bring your mask/headgear for your overnight stay.   Contacts, glasses, piercing's, hearing aid's, dentures or partials may not  be worn into surgery, please bring cases for these belongings.    For patients admitted to the hospital, discharge time will be determined by your treatment team.   Patients discharged the day of surgery will not be allowed to drive home, and someone needs to stay with them for 24 hours.  SURGICAL WAITING ROOM VISITATION Patients having surgery or a procedure may have two support people in the waiting room. These visitors may be switched out with other visitors if needed. Children under the age of 70 must have an adult accompany them who is not the patient. If the patient needs to stay at the hospital during part of their recovery, the visitor guidelines for inpatient rooms apply.  Please refer to the [facility] website for the visitor guidelines for Inpatients (after your surgery is over and you are in a regular room).    Special instructions:   [facility]- Preparing For Surgery  Before surgery, you can play an important role. Because skin is not sterile, your skin needs to be as free of germs as possible. You can reduce the number of germs on your skin by washing with CHG (chlorahexidine gluconate) Soap before surgery.  CHG is an antiseptic cleaner which kills germs and bonds with the skin to continue killing germs even after washing.    Oral Hygiene is also important to reduce your risk of infection.  Remember - BRUSH YOUR TEETH THE MORNING OF SURGERY WITH  YOUR REGULAR TOOTHPASTE  Please do not use if you have an allergy to CHG or antibacterial soaps. If your skin becomes reddened/irritated stop using the CHG.  Do not shave (including legs and underarms) for at least 48 hours prior to first CHG shower. It is OK to shave your face.  Please follow these instructions carefully.   Shower the NIGHT BEFORE SURGERY and the MORNING OF SURGERY  If you chose to wash your hair, wash your hair first as usual with your normal shampoo.  After you shampoo, rinse your hair and body thoroughly to  remove the shampoo.  Use CHG Soap as you would any other liquid soap. You can apply CHG directly to the skin and wash gently with a scrungie or a clean washcloth.   Apply the CHG Soap to your body ONLY FROM THE NECK DOWN.  Do not use on open wounds or open sores. Avoid contact with your eyes, ears, mouth and genitals (private parts). Wash Face and genitals (private parts)  with your normal soap.   Wash thoroughly, paying special attention to the area where your surgery will be performed.  Thoroughly rinse your body with warm water from the neck down.  DO NOT shower/wash with your normal soap after using and rinsing off the CHG Soap.  Pat yourself dry with a CLEAN TOWEL.  Wear CLEAN PAJAMAS to bed the night before surgery  Place CLEAN SHEETS on your bed the night before your surgery  DO NOT SLEEP WITH PETS.   Day of Surgery: Take a shower with CHG soap. Do not wear jewelry or makeup Do not wear lotions, powders, perfumes/colognes, or deodorant. Do not shave 48 hours prior to surgery.  Men may shave face and neck. Do not bring valuables to the hospital.  [facility] is not responsible for any belongings or valuables. Do not wear nail polish, gel polish, artificial nails, or any other type of covering on natural nails (fingers and toes) If you have artificial nails or gel coating that need to be removed by a nail salon, please have this removed prior to surgery. Artificial nails or gel coating may interfere with anesthesia's ability to adequately monitor your vital signs. Wear Clean/Comfortable clothing the morning of surgery Remember to brush your teeth WITH YOUR REGULAR TOOTHPASTE.   Please read over the following fact sheets that you were given.    If you received a COVID test during your pre-op visit  it is requested that you wear a mask when out in public, stay away from anyone that may not be feeling well and notify your surgeon if you develop symptoms. If you have been in  contact with anyone that has tested positive in the last 10 days please notify you surgeon.

## 2021-10-12 ENCOUNTER — Encounter (HOSPITAL_COMMUNITY)
Admission: RE | Admit: 2021-10-12 | Discharge: 2021-10-12 | Disposition: A | Discharge status: Home / Self Care | Payer: Medicare Other | Source: Ambulatory Visit | Attending: Orthopaedic Surgery | Admitting: Orthopaedic Surgery

## 2021-10-12 ENCOUNTER — Other Ambulatory Visit (HOSPITAL_COMMUNITY): Payer: Medicare Other

## 2021-10-12 ENCOUNTER — Other Ambulatory Visit: Payer: Self-pay

## 2021-10-12 ENCOUNTER — Encounter (HOSPITAL_COMMUNITY): Payer: Self-pay

## 2021-10-12 VITALS — BP 145/77 | HR 79 | Temp 98.3°F | Resp 17 | Ht <= 58 in | Wt 139.4 lb

## 2021-10-12 DIAGNOSIS — M1711 Unilateral primary osteoarthritis, right knee: Secondary | ICD-10-CM | POA: Diagnosis not present

## 2021-10-12 DIAGNOSIS — Z01818 Encounter for other preprocedural examination: Secondary | ICD-10-CM | POA: Insufficient documentation

## 2021-10-12 HISTORY — DX: Gastro-esophageal reflux disease without esophagitis: K21.9

## 2021-10-12 HISTORY — DX: Angina pectoris, unspecified: I20.9

## 2021-10-12 HISTORY — DX: Depression, unspecified: F32.A

## 2021-10-12 LAB — COMPREHENSIVE METABOLIC PANEL
ALT: 8 U/L (ref 0–44)
AST: 23 U/L (ref 15–41)
Albumin: 4.2 g/dL (ref 3.5–5.0)
Alkaline Phosphatase: 47 U/L (ref 38–126)
Anion gap: 9 (ref 5–15)
BUN: 14 mg/dL (ref 8–23)
CO2: 26 mmol/L (ref 22–32)
Calcium: 9 mg/dL (ref 8.9–10.3)
Chloride: 105 mmol/L (ref 98–111)
Creatinine, Ser: 0.72 mg/dL (ref 0.44–1.00)
GFR, Estimated: >60 mL/min (ref >60)
Glucose, Bld: 108 mg/dL — ABNORMAL HIGH (ref 70–99)
Potassium: 3.5 mmol/L (ref 3.5–5.1)
Sodium: 140 mmol/L (ref 135–145)
Total Bilirubin: 0.5 mg/dL (ref 0.3–1.2)
Total Protein: 7 g/dL (ref 6.5–8.1)

## 2021-10-12 LAB — SURGICAL PCR SCREEN
MRSA, PCR: NEGATIVE
Staphylococcus aureus: NEGATIVE

## 2021-10-12 LAB — CBC
HCT: 38.8 % (ref 36.0–46.0)
Hemoglobin: 12.5 g/dL (ref 12.0–15.0)
MCH: 29.3 pg (ref 26.0–34.0)
MCHC: 32.2 g/dL (ref 30.0–36.0)
MCV: 91.1 fL (ref 80.0–100.0)
Platelets: 237 10*3/uL (ref 150–400)
RBC: 4.26 MIL/uL (ref 3.87–5.11)
RDW: 13 % (ref 11.5–15.5)
WBC: 6.6 10*3/uL (ref 4.0–10.5)
nRBC: 0 % (ref 0.0–0.2)

## 2021-10-12 NOTE — Progress Notes (Signed)
PCP - Dr. Riki Sheer Cardiologist - denies  PPM/ICD - denies   Chest x-ray - 08/20/15 EKG - 10/12/21 Stress Test - 08/27/14 ECHO - denies Cardiac Cath - denies  Sleep Study - denies   DM- denies  ASA/Blood Thinner Instructions: n/a   ERAS Protcol - yes PRE-SURGERY Ensure given at PAT  COVID TEST- n/a   Anesthesia review: no  Patient denies shortness of breath, fever, cough and chest pain at PAT appointment   All instructions explained to the patient, with a verbal understanding of the material. Patient agrees to go over the instructions while at home for a better understanding. Patient also instructed to notify surgeon of any contact with COVID+ person or if she develops any symptoms. The opportunity to ask questions was provided.

## 2021-10-14 ENCOUNTER — Telehealth: Payer: Self-pay | Admitting: *Deleted

## 2021-10-14 NOTE — Care Plan (Signed)
Ortho bundle RNCM call to patient and spoke with patient's son to discuss her upcoming Right total knee arthroplasty with Dr. Roda Shutters on Monday, 10/18/21. She is an Ortho bundle patient through [facility] and is agreeable to case management. She lives with her son and he will be assisting at home after discharge. She has received her home CPM and RW through [employer] already. No further DME needed. Anticipate HHPT will be needed after short hospital stay. Referral made to [facility] after choice provided. Reviewed all post op care instructions with patient's son. Will continue to follow for needs.

## 2021-10-15 ENCOUNTER — Telehealth: Payer: Self-pay | Admitting: *Deleted

## 2021-10-15 ENCOUNTER — Other Ambulatory Visit: Payer: Self-pay | Admitting: Physician Assistant

## 2021-10-15 MED ORDER — TRANEXAMIC ACID 1000 MG/10ML IV SOLN
2000.0000 mg | INTRAVENOUS | Status: DC
  Filled 2021-10-15 (×2): qty 20

## 2021-10-15 MED ORDER — RIVAROXABAN 10 MG PO TABS: 10.0000 mg | ORAL_TABLET | Freq: Every day | ORAL | 0 refills | Status: DC

## 2021-10-17 ENCOUNTER — Encounter: Payer: Self-pay | Admitting: Gastroenterology

## 2021-10-17 DIAGNOSIS — M1711 Unilateral primary osteoarthritis, right knee: Secondary | ICD-10-CM

## 2021-10-18 ENCOUNTER — Ambulatory Visit (HOSPITAL_BASED_OUTPATIENT_CLINIC_OR_DEPARTMENT_OTHER): Payer: Medicare Other | Admitting: Certified Registered"

## 2021-10-18 ENCOUNTER — Other Ambulatory Visit: Payer: Self-pay

## 2021-10-18 ENCOUNTER — Observation Stay (HOSPITAL_COMMUNITY): Payer: Medicare Other

## 2021-10-18 ENCOUNTER — Ambulatory Visit (HOSPITAL_COMMUNITY): Payer: Medicare Other | Admitting: Certified Registered"

## 2021-10-18 ENCOUNTER — Observation Stay (HOSPITAL_COMMUNITY)
Admission: RE | Admit: 2021-10-18 | Discharge: 2021-10-19 | Disposition: A | Discharge status: Home Health | Payer: Medicare Other | Attending: Orthopaedic Surgery | Admitting: Orthopaedic Surgery

## 2021-10-18 ENCOUNTER — Encounter (HOSPITAL_COMMUNITY): Payer: Self-pay | Admitting: Orthopaedic Surgery

## 2021-10-18 ENCOUNTER — Other Ambulatory Visit: Payer: Self-pay | Admitting: Physician Assistant

## 2021-10-18 ENCOUNTER — Encounter (HOSPITAL_COMMUNITY)
Admission: RE | Disposition: A | Discharge status: Home Health | Payer: Self-pay | Source: Home / Self Care | Attending: Orthopaedic Surgery

## 2021-10-18 DIAGNOSIS — M1711 Unilateral primary osteoarthritis, right knee: Secondary | ICD-10-CM | POA: Diagnosis not present

## 2021-10-18 DIAGNOSIS — Z96651 Presence of right artificial knee joint: Secondary | ICD-10-CM | POA: Diagnosis not present

## 2021-10-18 DIAGNOSIS — Z79899 Other long term (current) drug therapy: Secondary | ICD-10-CM | POA: Diagnosis not present

## 2021-10-18 DIAGNOSIS — I10 Essential (primary) hypertension: Secondary | ICD-10-CM | POA: Diagnosis not present

## 2021-10-18 DIAGNOSIS — G8918 Other acute postprocedural pain: Secondary | ICD-10-CM | POA: Diagnosis not present

## 2021-10-18 HISTORY — PX: TOTAL KNEE ARTHROPLASTY: SHX125

## 2021-10-18 SURGERY — ARTHROPLASTY, KNEE, TOTAL
Anesthesia: Monitor Anesthesia Care | Site: Knee | Laterality: Right

## 2021-10-18 MED ORDER — LACTATED RINGERS IV SOLN: INTRAVENOUS | Status: DC

## 2021-10-18 MED ORDER — TRANEXAMIC ACID 1000 MG/10ML IV SOLN
INTRAVENOUS | Status: DC | PRN
  Administered 2021-10-18: 2000 mg via TOPICAL

## 2021-10-18 MED ORDER — ORAL CARE MOUTH RINSE
15.0000 mL | Freq: Once | OROMUCOSAL | Status: AC
Start: 2021-10-18 — End: 2021-10-18

## 2021-10-18 MED ORDER — MIDAZOLAM HCL 2 MG/2ML IJ SOLN
INTRAMUSCULAR | Status: AC
Start: 2021-10-18 — End: ?
  Filled 2021-10-18: qty 2

## 2021-10-18 MED ORDER — PHENOL 1.4 % MT LIQD
1.0000 | OROMUCOSAL | Status: DC | PRN
Start: 2021-10-18 — End: 2021-10-19

## 2021-10-18 MED ORDER — FENTANYL CITRATE (PF) 100 MCG/2ML IJ SOLN
INTRAMUSCULAR | Status: AC
  Filled 2021-10-18: qty 2

## 2021-10-18 MED ORDER — OXYCODONE HCL 5 MG PO TABS: 10.0000 mg | ORAL_TABLET | ORAL | Status: DC | PRN

## 2021-10-18 MED ORDER — OXYCODONE HCL 5 MG PO TABS
ORAL_TABLET | ORAL | Status: AC
  Filled 2021-10-18: qty 2

## 2021-10-18 MED ORDER — DEXAMETHASONE SODIUM PHOSPHATE 10 MG/ML IJ SOLN
10.0000 mg | Freq: Once | INTRAMUSCULAR | Status: AC
  Administered 2021-10-19: 10 mg via INTRAVENOUS
  Filled 2021-10-18: qty 1

## 2021-10-18 MED ORDER — VANCOMYCIN HCL 1000 MG IV SOLR
INTRAVENOUS | Status: AC
  Filled 2021-10-18: qty 20

## 2021-10-18 MED ORDER — VANCOMYCIN HCL 1000 MG IV SOLR
INTRAVENOUS | Status: DC | PRN
  Administered 2021-10-18: 1000 mg via TOPICAL

## 2021-10-18 MED ORDER — MIDAZOLAM HCL 2 MG/2ML IJ SOLN: 2.0000 mg | Freq: Once | INTRAMUSCULAR | Status: AC

## 2021-10-18 MED ORDER — ACETAMINOPHEN 325 MG PO TABS: 325.0000 mg | ORAL_TABLET | Freq: Four times a day (QID) | ORAL | Status: DC | PRN

## 2021-10-18 MED ORDER — TRANEXAMIC ACID-NACL 1000-0.7 MG/100ML-% IV SOLN
1000.0000 mg | INTRAVENOUS | Status: AC
  Administered 2021-10-18: 1000 mg via INTRAVENOUS

## 2021-10-18 MED ORDER — BUPIVACAINE-MELOXICAM ER 400-12 MG/14ML IJ SOLN
INTRAMUSCULAR | Status: AC
  Filled 2021-10-18: qty 1

## 2021-10-18 MED ORDER — RIVAROXABAN 10 MG PO TABS
10.0000 mg | ORAL_TABLET | Freq: Every day | ORAL | Status: DC
  Administered 2021-10-19: 10 mg via ORAL
  Filled 2021-10-18: qty 1

## 2021-10-18 MED ORDER — DEXAMETHASONE SODIUM PHOSPHATE 10 MG/ML IJ SOLN
INTRAMUSCULAR | Status: AC
  Filled 2021-10-18: qty 1

## 2021-10-18 MED ORDER — ONDANSETRON HCL 4 MG/2ML IJ SOLN
INTRAMUSCULAR | Status: AC
  Filled 2021-10-18: qty 2

## 2021-10-18 MED ORDER — PHENYLEPHRINE HCL-NACL 20-0.9 MG/250ML-% IV SOLN
INTRAVENOUS | Status: DC | PRN
  Administered 2021-10-18: 50 ug/min via INTRAVENOUS

## 2021-10-18 MED ORDER — DEXAMETHASONE SODIUM PHOSPHATE 10 MG/ML IJ SOLN
INTRAMUSCULAR | Status: DC | PRN
  Administered 2021-10-18: 10 mg via INTRAVENOUS

## 2021-10-18 MED ORDER — GLYCOPYRROLATE 0.2 MG/ML IJ SOLN
INTRAMUSCULAR | Status: DC | PRN
  Administered 2021-10-18: .2 mg via INTRAVENOUS

## 2021-10-18 MED ORDER — FERROUS SULFATE 325 (65 FE) MG PO TABS
325.0000 mg | ORAL_TABLET | Freq: Three times a day (TID) | ORAL | Status: DC
  Administered 2021-10-18 – 2021-10-19 (×2): 325 mg via ORAL
  Filled 2021-10-18 (×2): qty 1

## 2021-10-18 MED ORDER — OXYCODONE HCL ER 10 MG PO T12A
10.0000 mg | EXTENDED_RELEASE_TABLET | Freq: Two times a day (BID) | ORAL | Status: DC
  Administered 2021-10-18 – 2021-10-19 (×2): 10 mg via ORAL
  Filled 2021-10-18 (×2): qty 1

## 2021-10-18 MED ORDER — FENTANYL CITRATE (PF) 100 MCG/2ML IJ SOLN: 100.0000 ug | Freq: Once | INTRAMUSCULAR | Status: AC

## 2021-10-18 MED ORDER — LACTATED RINGERS IV SOLN: INTRAVENOUS | Status: DC | PRN

## 2021-10-18 MED ORDER — AMLODIPINE BESYLATE 5 MG PO TABS
10.0000 mg | ORAL_TABLET | Freq: Every day | ORAL | Status: DC
  Administered 2021-10-19: 10 mg via ORAL
  Filled 2021-10-18: qty 2

## 2021-10-18 MED ORDER — CEFAZOLIN SODIUM-DEXTROSE 2-3 GM-%(50ML) IV SOLR: INTRAVENOUS | Status: DC | PRN

## 2021-10-18 MED ORDER — POVIDONE-IODINE 10 % EX SWAB: 2.0000 "application " | Freq: Once | CUTANEOUS | Status: DC

## 2021-10-18 MED ORDER — TRANEXAMIC ACID-NACL 1000-0.7 MG/100ML-% IV SOLN
INTRAVENOUS | Status: AC
  Filled 2021-10-18: qty 100

## 2021-10-18 MED ORDER — 0.9 % SODIUM CHLORIDE (POUR BTL) OPTIME
TOPICAL | Status: DC | PRN
  Administered 2021-10-18: 1000 mL

## 2021-10-18 MED ORDER — KETOROLAC TROMETHAMINE 15 MG/ML IJ SOLN
INTRAMUSCULAR | Status: AC
  Filled 2021-10-18: qty 1

## 2021-10-18 MED ORDER — IRRISEPT - 450ML BOTTLE WITH 0.05% CHG IN STERILE WATER, USP 99.95% OPTIME
TOPICAL | Status: DC | PRN
  Administered 2021-10-18: 450 mL via TOPICAL

## 2021-10-18 MED ORDER — GABAPENTIN 100 MG PO CAPS
100.0000 mg | ORAL_CAPSULE | Freq: Every day | ORAL | Status: DC
  Administered 2021-10-18: 100 mg via ORAL
  Filled 2021-10-18: qty 1

## 2021-10-18 MED ORDER — METHOCARBAMOL 500 MG PO TABS
500.0000 mg | ORAL_TABLET | Freq: Four times a day (QID) | ORAL | Status: DC | PRN
  Administered 2021-10-19: 500 mg via ORAL
  Filled 2021-10-18: qty 1

## 2021-10-18 MED ORDER — KETOROLAC TROMETHAMINE 15 MG/ML IJ SOLN
7.5000 mg | Freq: Four times a day (QID) | INTRAMUSCULAR | Status: DC
  Administered 2021-10-18 – 2021-10-19 (×3): 7.5 mg via INTRAVENOUS
  Filled 2021-10-18 (×2): qty 1

## 2021-10-18 MED ORDER — ONDANSETRON HCL 4 MG/2ML IJ SOLN
4.0000 mg | Freq: Four times a day (QID) | INTRAMUSCULAR | Status: DC | PRN
  Administered 2021-10-18: 4 mg via INTRAVENOUS

## 2021-10-18 MED ORDER — METHOCARBAMOL 1000 MG/10ML IJ SOLN: 500.0000 mg | Freq: Four times a day (QID) | INTRAVENOUS | Status: DC | PRN

## 2021-10-18 MED ORDER — DOCUSATE SODIUM 100 MG PO CAPS
100.0000 mg | ORAL_CAPSULE | Freq: Two times a day (BID) | ORAL | Status: DC
  Administered 2021-10-18 – 2021-10-19 (×2): 100 mg via ORAL
  Filled 2021-10-18 (×2): qty 1

## 2021-10-18 MED ORDER — SODIUM CHLORIDE 0.9 % IR SOLN
Status: DC | PRN
  Administered 2021-10-18: 1000 mL

## 2021-10-18 MED ORDER — FENTANYL CITRATE (PF) 100 MCG/2ML IJ SOLN
INTRAMUSCULAR | Status: AC
  Administered 2021-10-18: 100 ug via INTRAVENOUS
  Filled 2021-10-18: qty 2

## 2021-10-18 MED ORDER — FENTANYL CITRATE (PF) 250 MCG/5ML IJ SOLN
INTRAMUSCULAR | Status: AC
  Filled 2021-10-18: qty 5

## 2021-10-18 MED ORDER — PHENYLEPHRINE HCL (PRESSORS) 10 MG/ML IV SOLN
INTRAVENOUS | Status: DC | PRN
  Administered 2021-10-18: 160 ug via INTRAVENOUS
  Administered 2021-10-18: 80 ug via INTRAVENOUS

## 2021-10-18 MED ORDER — HYDROMORPHONE HCL 1 MG/ML IJ SOLN: 0.5000 mg | INTRAMUSCULAR | Status: DC | PRN

## 2021-10-18 MED ORDER — METOCLOPRAMIDE HCL 5 MG/ML IJ SOLN: 5.0000 mg | Freq: Three times a day (TID) | INTRAMUSCULAR | Status: DC | PRN

## 2021-10-18 MED ORDER — LIDOCAINE 2% (20 MG/ML) 5 ML SYRINGE
INTRAMUSCULAR | Status: AC
  Filled 2021-10-18: qty 5

## 2021-10-18 MED ORDER — HYDROXYZINE HCL 50 MG/ML IM SOLN
50.0000 mg | Freq: Four times a day (QID) | INTRAMUSCULAR | Status: DC | PRN
  Administered 2021-10-18: 50 mg via INTRAMUSCULAR
  Filled 2021-10-18: qty 1

## 2021-10-18 MED ORDER — BUPIVACAINE-MELOXICAM ER 400-12 MG/14ML IJ SOLN
INTRAMUSCULAR | Status: DC | PRN
  Administered 2021-10-18: 400 mg

## 2021-10-18 MED ORDER — LIDOCAINE HCL (CARDIAC) PF 100 MG/5ML IV SOSY
PREFILLED_SYRINGE | INTRAVENOUS | Status: DC | PRN
  Administered 2021-10-18: 60 mg via INTRATRACHEAL

## 2021-10-18 MED ORDER — ONDANSETRON HCL 4 MG/2ML IJ SOLN
INTRAMUSCULAR | Status: DC | PRN
  Administered 2021-10-18: 4 mg via INTRAVENOUS

## 2021-10-18 MED ORDER — MENTHOL 3 MG MT LOZG: 1.0000 | LOZENGE | OROMUCOSAL | Status: DC | PRN

## 2021-10-18 MED ORDER — POVIDONE-IODINE 10 % EX SWAB: 2.0000 | Freq: Once | CUTANEOUS | Status: DC

## 2021-10-18 MED ORDER — PROPOFOL 500 MG/50ML IV EMUL
INTRAVENOUS | Status: DC | PRN
  Administered 2021-10-18: 120 mg via INTRAVENOUS

## 2021-10-18 MED ORDER — KETOROLAC TROMETHAMINE 30 MG/ML IJ SOLN
INTRAMUSCULAR | Status: AC
  Filled 2021-10-18: qty 1

## 2021-10-18 MED ORDER — FENTANYL CITRATE (PF) 100 MCG/2ML IJ SOLN
25.0000 ug | INTRAMUSCULAR | Status: DC | PRN
  Administered 2021-10-18 (×2): 50 ug via INTRAVENOUS

## 2021-10-18 MED ORDER — OXYCODONE HCL 5 MG PO TABS
5.0000 mg | ORAL_TABLET | ORAL | Status: DC | PRN
  Administered 2021-10-18: 10 mg via ORAL

## 2021-10-18 MED ORDER — ACETAMINOPHEN 500 MG PO TABS
1000.0000 mg | ORAL_TABLET | Freq: Four times a day (QID) | ORAL | Status: DC
  Administered 2021-10-18 – 2021-10-19 (×3): 1000 mg via ORAL
  Filled 2021-10-18 (×3): qty 2

## 2021-10-18 MED ORDER — ONDANSETRON HCL 4 MG PO TABS
4.0000 mg | ORAL_TABLET | Freq: Four times a day (QID) | ORAL | Status: DC | PRN
  Filled 2021-10-18: qty 1

## 2021-10-18 MED ORDER — MIDAZOLAM HCL 2 MG/2ML IJ SOLN
INTRAMUSCULAR | Status: AC
  Administered 2021-10-18: 2 mg via INTRAVENOUS
  Filled 2021-10-18: qty 2

## 2021-10-18 MED ORDER — CEFAZOLIN SODIUM-DEXTROSE 2-4 GM/100ML-% IV SOLN
INTRAVENOUS | Status: AC
  Filled 2021-10-18: qty 100

## 2021-10-18 MED ORDER — CHLORHEXIDINE GLUCONATE 0.12 % MT SOLN
OROMUCOSAL | Status: AC
  Administered 2021-10-18: 15 mL via OROMUCOSAL
  Filled 2021-10-18: qty 15

## 2021-10-18 MED ORDER — ROPIVACAINE HCL 7.5 MG/ML IJ SOLN
INTRAMUSCULAR | Status: DC | PRN
  Administered 2021-10-18: 20 mL via PERINEURAL

## 2021-10-18 MED ORDER — CEFAZOLIN SODIUM-DEXTROSE 2-4 GM/100ML-% IV SOLN
2.0000 g | INTRAVENOUS | Status: AC
  Administered 2021-10-18: 2 g via INTRAVENOUS

## 2021-10-18 MED ORDER — FENTANYL CITRATE (PF) 250 MCG/5ML IJ SOLN
INTRAMUSCULAR | Status: DC | PRN
  Administered 2021-10-18 (×2): 50 ug via INTRAVENOUS

## 2021-10-18 MED ORDER — CHLORHEXIDINE GLUCONATE 0.12 % MT SOLN: 15.0000 mL | Freq: Once | OROMUCOSAL | Status: AC

## 2021-10-18 MED ORDER — METOCLOPRAMIDE HCL 5 MG PO TABS: 5.0000 mg | ORAL_TABLET | Freq: Three times a day (TID) | ORAL | Status: DC | PRN

## 2021-10-18 MED ORDER — CEFAZOLIN SODIUM-DEXTROSE 2-4 GM/100ML-% IV SOLN
2.0000 g | Freq: Four times a day (QID) | INTRAVENOUS | Status: AC
  Administered 2021-10-18 – 2021-10-19 (×2): 2 g via INTRAVENOUS
  Filled 2021-10-18 (×2): qty 100

## 2021-10-18 MED ORDER — TRANEXAMIC ACID-NACL 1000-0.7 MG/100ML-% IV SOLN
1000.0000 mg | Freq: Once | INTRAVENOUS | Status: AC
  Administered 2021-10-18: 1000 mg via INTRAVENOUS
  Filled 2021-10-18: qty 100

## 2021-10-18 MED ORDER — SODIUM CHLORIDE 0.9 % IV SOLN: INTRAVENOUS | Status: DC

## 2021-10-18 SURGICAL SUPPLY — 85 items
ALCOHOL 70% 16 OZ (MISCELLANEOUS) ×2 IMPLANT
BAG COUNTER SPONGE SURGICOUNT (BAG) ×1 IMPLANT
BAG DECANTER FOR FLEXI CONT (MISCELLANEOUS) ×2 IMPLANT
BANDAGE ESMARK 6X9 LF (GAUZE/BANDAGES/DRESSINGS) IMPLANT
BIT DRILL QC PATELLA 6.4 (BIT) ×1 IMPLANT
BLADE SAG 18X100X1.27 (BLADE) ×2 IMPLANT
BNDG ESMARK 6X9 LF (GAUZE/BANDAGES/DRESSINGS)
BOWL SMART MIX CTS (DISPOSABLE) ×2 IMPLANT
CEMENT BONE REFOBACIN R1X40 US (Cement) ×2 IMPLANT
CLSR STERI-STRIP ANTIMIC 1/2X4 (GAUZE/BANDAGES/DRESSINGS) ×4 IMPLANT
COMP FEM CR CEMT STD SZ5 (Joint) ×2 IMPLANT
COMPONENT FEM CR CEMT STD SZ5 (Joint) IMPLANT
COOLER ICEMAN CLASSIC (MISCELLANEOUS) ×2 IMPLANT
COVER SURGICAL LIGHT HANDLE (MISCELLANEOUS) ×2 IMPLANT
CUFF TOURN SGL QUICK 24 (TOURNIQUET CUFF) ×1
CUFF TOURN SGL QUICK 34 (TOURNIQUET CUFF)
CUFF TOURN SGL QUICK 42 (TOURNIQUET CUFF) IMPLANT
CUFF TRNQT CYL 24X4X16.5-23 (TOURNIQUET CUFF) IMPLANT
CUFF TRNQT CYL 34X4.125X (TOURNIQUET CUFF) ×1 IMPLANT
DERMABOND ADVANCED (GAUZE/BANDAGES/DRESSINGS) ×1
DERMABOND ADVANCED .7 DNX12 (GAUZE/BANDAGES/DRESSINGS) ×1 IMPLANT
DRAPE EXTREMITY T 121X128X90 (DISPOSABLE) ×2 IMPLANT
DRAPE HALF SHEET 40X57 (DRAPES) ×2 IMPLANT
DRAPE INCISE IOBAN 66X45 STRL (DRAPES) ×1 IMPLANT
DRAPE ORTHO SPLIT 77X108 STRL (DRAPES)
DRAPE POUCH INSTRU U-SHP 10X18 (DRAPES) ×2 IMPLANT
DRAPE SURG ORHT 6 SPLT 77X108 (DRAPES) ×2 IMPLANT
DRAPE U-SHAPE 47X51 STRL (DRAPES) ×4 IMPLANT
DRSG AQUACEL AG ADV 3.5X10 (GAUZE/BANDAGES/DRESSINGS) ×2 IMPLANT
DURAPREP 26ML APPLICATOR (WOUND CARE) ×7 IMPLANT
ELECT CAUTERY BLADE 6.4 (BLADE) ×2 IMPLANT
ELECT REM PT RETURN 9FT ADLT (ELECTROSURGICAL) ×2
ELECTRODE REM PT RTRN 9FT ADLT (ELECTROSURGICAL) ×1 IMPLANT
GLOVE BIOGEL PI IND STRL 7.0 (GLOVE) ×1 IMPLANT
GLOVE BIOGEL PI IND STRL 7.5 (GLOVE) ×4 IMPLANT
GLOVE BIOGEL PI INDICATOR 7.0 (GLOVE) ×1
GLOVE BIOGEL PI INDICATOR 7.5 (GLOVE) ×4
GLOVE ECLIPSE 7.0 STRL STRAW (GLOVE) ×6 IMPLANT
GLOVE SKINSENSE NS SZ7.5 (GLOVE) ×3
GLOVE SKINSENSE STRL SZ7.5 (GLOVE) ×3 IMPLANT
GLOVE SURG UNDER LTX SZ7.5 (GLOVE) ×4 IMPLANT
GLOVE SURG UNDER POLY LF SZ7 (GLOVE) ×4 IMPLANT
GOWN STRL REIN XL XLG (GOWN DISPOSABLE) ×2 IMPLANT
GOWN STRL REUS W/ TWL LRG LVL3 (GOWN DISPOSABLE) ×1 IMPLANT
GOWN STRL REUS W/TWL LRG LVL3 (GOWN DISPOSABLE) ×1
HANDPIECE INTERPULSE COAX TIP (DISPOSABLE) ×1
HDLS TROCR DRIL PIN KNEE 75 (PIN) ×1
HOOD PEEL AWAY FLYTE STAYCOOL (MISCELLANEOUS) ×4 IMPLANT
IMPL ASF RT PSN 4-5/CD 10 (Joint) IMPLANT
IMPLANT ASF RT PSN 4-5/CD 10 (Joint) ×2 IMPLANT
JET LAVAGE IRRISEPT WOUND (IRRIGATION / IRRIGATOR) ×2
KIT BASIN OR (CUSTOM PROCEDURE TRAY) ×2 IMPLANT
KIT TURNOVER KIT B (KITS) ×2 IMPLANT
LAVAGE JET IRRISEPT WOUND (IRRIGATION / IRRIGATOR) ×1 IMPLANT
MANIFOLD NEPTUNE II (INSTRUMENTS) ×2 IMPLANT
MARKER SKIN DUAL TIP RULER LAB (MISCELLANEOUS) ×3 IMPLANT
NDL SPNL 18GX3.5 QUINCKE PK (NEEDLE) ×1 IMPLANT
NEEDLE SPNL 18GX3.5 QUINCKE PK (NEEDLE) ×2 IMPLANT
NS IRRIG 1000ML POUR BTL (IV SOLUTION) ×2 IMPLANT
PACK TOTAL JOINT (CUSTOM PROCEDURE TRAY) ×2 IMPLANT
PAD ARMBOARD 7.5X6 YLW CONV (MISCELLANEOUS) ×3 IMPLANT
PAD COLD SHLDR WRAP-ON (PAD) ×2 IMPLANT
PIN DRILL HDLS TROCAR 75 4PK (PIN) IMPLANT
SAW OSC TIP CART 19.5X105X1.3 (SAW) ×2 IMPLANT
SCREW FEMALE HEX FIX 25X2.5 (ORTHOPEDIC DISPOSABLE SUPPLIES) ×1 IMPLANT
SET HNDPC FAN SPRY TIP SCT (DISPOSABLE) ×1 IMPLANT
STAPLER VISISTAT 35W (STAPLE) IMPLANT
STEM POLY PAT PLY 29M KNEE (Knees) ×1 IMPLANT
STEM TIBIA 5 DEG SZ C R KNEE (Knees) IMPLANT
SUCTION FRAZIER HANDLE 10FR (MISCELLANEOUS) ×1
SUCTION TUBE FRAZIER 10FR DISP (MISCELLANEOUS) ×1 IMPLANT
SUT ETHILON 2 0 FS 18 (SUTURE) ×2 IMPLANT
SUT MNCRL AB 3-0 PS2 27 (SUTURE) IMPLANT
SUT VIC AB 0 CT1 27 (SUTURE) ×2
SUT VIC AB 0 CT1 27XBRD ANBCTR (SUTURE) ×2 IMPLANT
SUT VIC AB 1 CTX 27 (SUTURE) ×5 IMPLANT
SUT VIC AB 2-0 CT1 27 (SUTURE) ×4
SUT VIC AB 2-0 CT1 TAPERPNT 27 (SUTURE) ×4 IMPLANT
SYR 50ML LL SCALE MARK (SYRINGE) ×3 IMPLANT
TIBIA STEM 5 DEG SZ C R KNEE (Knees) ×2 IMPLANT
TOWEL GREEN STERILE (TOWEL DISPOSABLE) ×2 IMPLANT
TOWEL GREEN STERILE FF (TOWEL DISPOSABLE) ×2 IMPLANT
TRAY CATH 16FR W/PLASTIC CATH (SET/KITS/TRAYS/PACK) IMPLANT
UNDERPAD 30X36 HEAVY ABSORB (UNDERPADS AND DIAPERS) ×2 IMPLANT
YANKAUER SUCT BULB TIP NO VENT (SUCTIONS) ×4 IMPLANT

## 2021-10-19 ENCOUNTER — Encounter (HOSPITAL_COMMUNITY): Payer: Self-pay | Admitting: Orthopaedic Surgery

## 2021-10-19 DIAGNOSIS — Z96651 Presence of right artificial knee joint: Secondary | ICD-10-CM | POA: Diagnosis not present

## 2021-10-19 DIAGNOSIS — I10 Essential (primary) hypertension: Secondary | ICD-10-CM | POA: Diagnosis not present

## 2021-10-19 DIAGNOSIS — M1711 Unilateral primary osteoarthritis, right knee: Secondary | ICD-10-CM | POA: Diagnosis not present

## 2021-10-19 DIAGNOSIS — Z79899 Other long term (current) drug therapy: Secondary | ICD-10-CM | POA: Diagnosis not present

## 2021-10-19 LAB — CBC
HCT: 34.4 % — ABNORMAL LOW (ref 36.0–46.0)
Hemoglobin: 11.7 g/dL — ABNORMAL LOW (ref 12.0–15.0)
MCH: 30.3 pg (ref 26.0–34.0)
MCHC: 34 g/dL (ref 30.0–36.0)
MCV: 89.1 fL (ref 80.0–100.0)
Platelets: 250 10*3/uL (ref 150–400)
RBC: 3.86 MIL/uL — ABNORMAL LOW (ref 3.87–5.11)
RDW: 12.4 % (ref 11.5–15.5)
WBC: 7.4 10*3/uL (ref 4.0–10.5)
nRBC: 0 % (ref 0.0–0.2)

## 2021-10-19 NOTE — Evaluation (Signed)
Physical Therapy Evaluation and Discharge Patient Details Name: Amber Mccall MRN: [id number] DOB: 07/16/1950 Today's Date: 10/19/2021  History of Present Illness  71 yo female s/p R TKR on 6/26 PMH closed fx of metatarsal bones depresssion Htn lumbar radiculopathy OA osteoporosis  Clinical Impression   Patient evaluated by Physical Therapy with no further acute PT needs identified. All education has been completed and the patient has no further questions. Patient mobilizing well and all education completed with no need for bid session. RN aware and pt anxious to return home.  See below for any follow-up Physical Therapy or equipment needs. PT is signing off. Thank you for this referral.        Recommendations for follow up therapy are one component of a multi-disciplinary discharge planning process, led by the attending physician.  Recommendations may be updated based on patient status, additional functional criteria and insurance authorization.  Follow Up Recommendations Follow physician's recommendations for discharge plan and follow up therapies      Assistance Recommended at Discharge Intermittent Supervision/Assistance  Patient can return home with the following  Help with stairs or ramp for entrance    Equipment Recommendations None recommended by PT  Recommendations for Other Services       Functional Status Assessment Patient has had a recent decline in their functional status and demonstrates the ability to make significant improvements in function in a reasonable and predictable amount of time.     Precautions / Restrictions Precautions Precautions: Knee Precaution Booklet Issued: No (no handout in [place]) Restrictions Weight Bearing Restrictions: Yes RLE Weight Bearing: Weight bearing as tolerated      Mobility  Bed Mobility Overal bed mobility: Independent                  Transfers Overall transfer level: Needs assistance Equipment used: Rolling walker  (2 wheels) Transfers: Sit to/from Stand Sit to Stand: Supervision           General transfer comment: vc for hand placement with RW    Ambulation/Gait Ambulation/Gait assistance: Supervision Gait Distance (Feet): 140 Feet Assistive device: Rolling walker (2 wheels) Gait Pattern/deviations: Step-to pattern, Decreased stride length, Antalgic       General Gait Details: vc for sequencing with pt then carrying over without need for cues  Stairs Stairs: Yes Stairs assistance: Min assist Stair Management: Step to pattern, Backwards, With walker Number of Stairs: 3 General stair comments: pt with difficulty initially understanding technique, but quickly progressed to completing task with only min assist to steady RW  Wheelchair Mobility    Modified Rankin (Stroke Patients Only)       Balance                                             Pertinent Vitals/Pain Pain Assessment Pain Assessment: 0-10 Pain Score: 5  Pain Location: rt knee Pain Descriptors / Indicators: Aching, Grimacing Pain Intervention(s): Limited activity within patient's tolerance, Monitored during session, Premedicated before session    Home Living Family/patient expects to be discharged to:: Private residence Living Arrangements: Children Available Help at Discharge: Family;Available 24 hours/day Type of Home: House Home Access: Stairs to enter Entrance Stairs-Rails: None Entrance Stairs-Number of Steps: 2   Home Layout: One level Home Equipment: Agricultural consultant (2 wheels)      Prior Function Prior Level of Function : Independent/Modified Independent  Hand Dominance        Extremity/Trunk Assessment   Upper Extremity Assessment Upper Extremity Assessment: Defer to OT evaluation    Lower Extremity Assessment Lower Extremity Assessment: RLE deficits/detail RLE Deficits / Details: post-op knee bandage; +edema; 0-90 degrees ROM    Cervical /  Trunk Assessment Cervical / Trunk Assessment: Normal  Communication   Communication: Interpreter utilized;Prefers language other than English (virtual interpreter)  Cognition Arousal/Alertness: Awake/alert Behavior During Therapy: WFL for tasks assessed/performed Overall Cognitive Status: Within Functional Limits for tasks assessed                                          General Comments      Exercises Total Joint Exercises Ankle Circles/Pumps: AROM, Both, 10 reps Quad Sets: AROM, 10 reps, Right Short Arc Quad: AROM, 10 reps, Right Heel Slides: AROM, Right, 5 reps Hip ABduction/ADduction: AROM, Right, 10 reps Straight Leg Raises: Right, AAROM, 10 reps Long Arc Quad: AROM, Right, 5 reps Knee Flexion: AROM, Right, 5 reps Goniometric ROM: 0-90   Assessment/Plan    PT Assessment All further PT needs can be met in the next venue of care  PT Problem List Decreased range of motion;Decreased mobility;Decreased knowledge of use of DME;Pain       PT Treatment Interventions      PT Goals (Current goals can be found in the Care Plan section)  Acute Rehab PT Goals Patient Stated Goal: go home today PT Goal Formulation: All assessment and education complete, DC therapy    Frequency       Co-evaluation               AM-PAC PT "6 Clicks" Mobility  Outcome Measure Help needed turning from your back to your side while in a flat bed without using bedrails?: None Help needed moving from lying on your back to sitting on the side of a flat bed without using bedrails?: None Help needed moving to and from a bed to a chair (including a wheelchair)?: A Little Help needed standing up from a chair using your arms (e.g., wheelchair or bedside chair)?: A Little Help needed to walk in hospital room?: A Little Help needed climbing 3-5 steps with a railing? : A Little 6 Click Score: 20    End of Session Equipment Utilized During Treatment: Gait belt Activity Tolerance:  Patient tolerated treatment well Patient left: in bed;with call bell/phone within reach;with family/visitor present Nurse Communication: Mobility status;Other (comment) (ok for dc; no DME needs) PT Visit Diagnosis: Difficulty in walking, not elsewhere classified (R26.2)    Time: 8119-1478 PT Time Calculation (min) (ACUTE ONLY): 25 min   Charges:   PT Evaluation $PT Eval Low Complexity: 1 Low PT Treatments $Gait Training: 8-22 mins         Jerolyn Center, PT Acute Rehabilitation Services  Office [phone]   Zena Amos 10/19/2021, 11:08 AM

## 2021-10-19 NOTE — Evaluation (Signed)
Occupational Therapy Evaluation Patient Details Name: Amber Mccall MRN: [id number] DOB: 12-Mar-1951 Today's Date: 10/19/2021   History of Present Illness 71 yo female s/p R TKR on 6/26 PMH closed fx of metatarsal bones depresssion Htn lumbar radiculopathy OA osteoporosis   Clinical Impression   Patient is s/p R TKR surgery resulting in functional limitations due to the deficits listed below (see OT problem list). Pt with son present to help with interpretation. Pt and pt expressed wanting to make sure home health can provide interpretation services. Pt able to dress herself with cues to use RW.  Patient will benefit from skilled OT acutely to increase independence and safety with ADLS to allow discharge HHOT.       Recommendations for follow up therapy are one component of a multi-disciplinary discharge planning process, led by the attending physician.  Recommendations may be updated based on patient status, additional functional criteria and insurance authorization.   Follow Up Recommendations  Home health OT    Assistance Recommended at Discharge Set up Supervision/Assistance  Patient can return home with the following A little help with walking and/or transfers;A little help with bathing/dressing/bathroom    Functional Status Assessment  Patient has had a recent decline in their functional status and demonstrates the ability to make significant improvements in function in a reasonable and predictable amount of time.  Equipment Recommendations  Other (comment) (RW)    Recommendations for Other Services       Precautions / Restrictions Precautions Precautions: Knee Precaution Booklet Issued: No (no handout in [place]) Restrictions Weight Bearing Restrictions: Yes RLE Weight Bearing: Weight bearing as tolerated      Mobility Bed Mobility Overal bed mobility: Independent                  Transfers Overall transfer level: Needs assistance Equipment used: Rolling walker  (2 wheels) Transfers: Sit to/from Stand Sit to Stand: Supervision                  Balance                                           ADL either performed or assessed with clinical judgement   ADL Overall ADL's : Needs assistance/impaired Eating/Feeding: Independent   Grooming: Independent   Upper Body Bathing: Set up   Lower Body Bathing: Set up   Upper Body Dressing : Set up   Lower Body Dressing: Set up Lower Body Dressing Details (indicate cue type and reason): pt was able to thread pants and then bridge buttock to don Toilet Transfer: Min Pension scheme manager Details (indicate cue type and reason): initially going to the bathroom leaving the RW. pt reaching for walls instead. pt instructed on RW use afterward and decrease of pain with RW usage Toileting- Clothing Manipulation and Hygiene: Min guard;Sitting/lateral lean       Functional mobility during ADLs: Min guard;Rolling walker (2 wheels) General ADL Comments: son present for entire session except don of clothing. pt reports he will setup out for dressing of his mother     Vision Baseline Vision/History: 0 No visual deficits       Perception     Praxis      Pertinent Vitals/Pain Pain Assessment Pain Assessment: 0-10 Pain Score: 7  Pain Location: rt knee Pain Descriptors / Indicators: Aching, Grimacing Pain Intervention(s): Monitored during session, Premedicated before  session, Repositioned, Patient requesting pain meds-RN notified     Hand Dominance Right   Extremity/Trunk Assessment Upper Extremity Assessment Upper Extremity Assessment: Overall WFL for tasks assessed   Lower Extremity Assessment Lower Extremity Assessment: Defer to PT evaluation;RLE deficits/detail RLE Deficits / Details: post-op knee bandage; +edema; 0-90 degrees ROM   Cervical / Trunk Assessment Cervical / Trunk Assessment: Normal   Communication Communication Communication: Interpreter  utilized;Prefers language other than English (Son used during session)   Cognition Arousal/Alertness: Awake/alert Behavior During Therapy: WFL for tasks assessed/performed Overall Cognitive Status: Within Functional Limits for tasks assessed                                       General Comments  dressing intact and noted several dark drop of blood under dressing    Exercises     Shoulder Instructions      Home Living Family/patient expects to be discharged to:: Private residence Living Arrangements: Children Available Help at Discharge: Family;Available 24 hours/day Type of Home: House Home Access: Stairs to enter [employer] of Steps: 2 Entrance Stairs-Rails: None Home Layout: One level     Bathroom Shower/Tub: Chief Strategy Officer: Standard     Home Equipment: Agricultural consultant (2 wheels)   Additional Comments: son and sister will give (A)      Prior Functioning/Environment Prior Level of Function : Independent/Modified Independent                        OT Problem List: Decreased activity tolerance;Impaired balance (sitting and/or standing);Decreased range of motion;Decreased safety awareness      OT Treatment/Interventions: Self-care/ADL training;Therapeutic exercise;DME and/or AE instruction;Therapeutic activities;Patient/family education;Balance training    OT Goals(Current goals can be found in the care plan section) Acute Rehab OT Goals Patient Stated Goal: to get home therapy OT Goal Formulation: With patient Time For Goal Achievement: 11/02/21 Potential to Achieve Goals: Good  OT Frequency: Min 2X/week    Co-evaluation              AM-PAC OT "6 Clicks" Daily Activity     Outcome Measure Help from another person eating meals?: None Help from another person taking care of personal grooming?: None Help from another person toileting, which includes using toliet, bedpan, or urinal?: A Little Help from  another person bathing (including washing, rinsing, drying)?: A Little Help from another person to put on and taking off regular upper body clothing?: None Help from another person to put on and taking off regular lower body clothing?: A Little 6 Click Score: 21   End of Session Equipment Utilized During Treatment: Rolling walker (2 wheels) Nurse Communication: Mobility status;Precautions  Activity Tolerance: Patient tolerated treatment well Patient left: in bed;with call bell/phone within reach;with family/visitor present  OT Visit Diagnosis: Unsteadiness on feet (R26.81)                Time: 4696-2952 OT Time Calculation (min): 34 min Charges:  OT General Charges $OT Visit: 1 Visit OT Evaluation $OT Eval Moderate Complexity: 1 Mod   Brynn, OTR/L  Acute Rehabilitation Services Office: [phone] .   Mateo Flow 10/19/2021, 11:52 AM

## 2021-10-19 NOTE — Discharge Summary (Signed)
Patient ID: Amber Mccall MRN: [id number] DOB/AGE: 11-05-1950 71 y.o.  Admit date: 10/18/2021 Discharge date: 10/19/2021  Admission Diagnoses:  Primary osteoarthritis of right knee  Discharge Diagnoses:  Principal Problem:   Primary osteoarthritis of right knee Active Problems:   Status post total right knee replacement   Past Medical History:  Diagnosis Date   Anginal pain (HCC)    pt says she feels like this may be related to acid reflux   Closed fracture of metatarsal bone(s) 01/30/2013   Depression    Essential hypertension    GERD (gastroesophageal reflux disease)    Lumbar radiculopathy    Osteoarthritis    Osteoporosis     Surgeries: Procedure(s): RIGHT TOTAL KNEE ARTHROPLASTY on 10/18/2021   Consultants (if any):   Discharged Condition: Improved  Hospital Course: Amber Mccall is an 71 y.o. female who was admitted 10/18/2021 with a diagnosis of Primary osteoarthritis of right knee and went to the operating room on 10/18/2021 and underwent the above named procedures.    She was given perioperative antibiotics:  Anti-infectives (From admission, onward)    Start     Dose/Rate Route Frequency Ordered Stop   10/18/21 1830  ceFAZolin (ANCEF) IVPB 2g/100 mL premix        2 g 200 mL/hr over 30 Minutes Intravenous Every 6 hours 10/18/21 1729 10/19/21 0044   10/18/21 1405  vancomycin (VANCOCIN) powder  Status:  Discontinued          As needed 10/18/21 1405 10/18/21 1454   10/18/21 1015  ceFAZolin (ANCEF) IVPB 2g/100 mL premix        2 g 200 mL/hr over 30 Minutes Intravenous On call to O.R. 10/18/21 1008 10/18/21 1252   10/18/21 1009  ceFAZolin (ANCEF) 2-4 GM/100ML-% IVPB       Note to Pharmacy: Phebe Colla N: cabinet override      10/18/21 1009 10/18/21 1255     .  She was given sequential compression devices, early ambulation, and appropriate chemoprophylaxis for DVT prophylaxis.  She benefited maximally from the hospital stay and there were no complications.     Recent vital signs:  Vitals:   10/19/21 0338 10/19/21 0725  BP: (!) 100/50 126/67  Pulse: 73 75  Resp: 18 18  Temp: 97.8 F (36.6 C) 98.3 F (36.8 C)  SpO2: 95% 94%    Recent laboratory studies:  Lab Results  Component Value Date   HGB 11.7 (L) 10/19/2021   HGB 12.5 10/12/2021   HGB 12.5 05/19/2021   Lab Results  Component Value Date   WBC 7.4 10/19/2021   PLT 250 10/19/2021   No results found for: "INR" Lab Results  Component Value Date   NA 140 10/12/2021   K 3.5 10/12/2021   CL 105 10/12/2021   CO2 26 10/12/2021   BUN 14 10/12/2021   CREATININE 0.72 10/12/2021   GLUCOSE 108 (H) 10/12/2021    Discharge Medications:   Allergies as of 10/19/2021   No Known Allergies      Medication List     TAKE these medications    amLODipine 10 MG tablet Commonly known as: NORVASC TAKE 1 TABLET(10 MG) BY MOUTH DAILY   calcium citrate 950 (200 Ca) MG tablet Commonly known as: CALCITRATE - dosed in mg elemental calcium Take 1 tablet by mouth daily.   diclofenac Sodium 1 % Gel Commonly known as: VOLTAREN Apply 4 g topically 4 (four) times daily. What changed:  when to take this reasons  to take this   docusate sodium 100 MG capsule Commonly known as: Colace Take 1 capsule (100 mg total) by mouth daily as needed.   gabapentin 100 MG capsule Commonly known as: NEURONTIN TAKE 1 CAPSULE(100 MG) BY MOUTH AT BEDTIME   HYDROcodone-acetaminophen 5-325 MG tablet Commonly known as: NORCO/VICODIN Take 1 tablet by mouth every 6 (six) hours as needed for moderate pain.   meloxicam 15 MG tablet Commonly known as: MOBIC Take 15 mg by mouth daily.   methocarbamol 750 MG tablet Commonly known as: Robaxin-750 Take 1 tablet (750 mg total) by mouth 2 (two) times daily as needed for muscle spasms.   ondansetron 4 MG tablet Commonly known as: Zofran Take 1 tablet (4 mg total) by mouth every 8 (eight) hours as needed for nausea or vomiting.   oxyCODONE-acetaminophen  5-325 MG tablet Commonly known as: Percocet Take 1-2 tablets by mouth every 6 (six) hours as needed. To be taken after surgery   pantoprazole 40 MG tablet Commonly known as: PROTONIX Take 1 tablet (40 mg total) by mouth daily.   rivaroxaban 10 MG Tabs tablet Commonly known as: XARELTO Take 1 tablet (10 mg total) by mouth daily. To be taken after surgery to prevent blood clots   sertraline 50 MG tablet Commonly known as: ZOLOFT Take 50 mg by mouth daily.   tiZANidine 4 MG tablet Commonly known as: ZANAFLEX Take 4 mg by mouth every 8 (eight) hours as needed for muscle spasms.   traZODone 100 MG tablet Commonly known as: DESYREL Take 100 mg by mouth at bedtime.   VITAMIN D (CHOLECALCIFEROL) PO Take 1 capsule by mouth daily.               Durable Medical Equipment  (From admission, onward)           Start     Ordered   10/18/21 1730  DME Walker rolling  Once       Question Answer Comment  Walker: With 5 Inch Wheels   Patient needs a walker to treat with the following condition Status post left partial knee replacement      10/18/21 1729   10/18/21 1730  DME 3 n 1  Once        10/18/21 1729   10/18/21 1730  DME Bedside commode  Once       Question:  Patient needs a bedside commode to treat with the following condition  Answer:  Status post left partial knee replacement   10/18/21 1729            Diagnostic Studies: DG Knee Right Port  Result Date: 10/18/2021 CLINICAL DATA:  Right total knee arthroplasty. EXAM: PORTABLE RIGHT KNEE - 2 VIEW COMPARISON:  Bilateral knee radiographs 07/27/2021. FINDINGS: Right total knee arthroplasty noted. The tibial component is cemented. Joint spaces are normal. Gas and fluid are present within the joint space. IMPRESSION: Right total knee arthroplasty without radiographic evidence for complication. Electronically Signed   By: Marin Roberts M.D.   On: 10/18/2021 15:13    Disposition: Discharge disposition: 01-Home or  Self Care       Discharge Instructions     Call MD / Call 911   Complete by: As directed    If you experience chest pain or shortness of breath, CALL 911 and be transported to the hospital emergency room.  If you develope a fever above 101.5 F, pus (white drainage) or increased drainage or redness at the wound, or calf pain, call  your surgeon's office.   Constipation Prevention   Complete by: As directed    Drink plenty of fluids.  Prune juice may be helpful.  You may use a stool softener, such as Colace (over the counter) 100 mg twice a day.  Use MiraLax (over the counter) for constipation as needed.   Driving restrictions   Complete by: As directed    No driving while taking narcotic pain meds.   Increase activity slowly as tolerated   Complete by: As directed    Post-operative opioid taper instructions:   Complete by: As directed    POST-OPERATIVE OPIOID TAPER INSTRUCTIONS: It is important to wean off of your opioid medication as soon as possible. If you do not need pain medication after your surgery it is ok to stop day one. Opioids include: Codeine, Hydrocodone(Norco, Vicodin), Oxycodone(Percocet, oxycontin) and hydromorphone amongst others.  Long term and even short term use of opiods can cause: Increased pain response Dependence Constipation Depression Respiratory depression And more.  Withdrawal symptoms can include Flu like symptoms Nausea, vomiting And more Techniques to manage these symptoms Hydrate well Eat regular healthy meals Stay active Use relaxation techniques(deep breathing, meditating, yoga) Do Not substitute Alcohol to help with tapering If you have been on opioids for less than two weeks and do not have pain than it is ok to stop all together.  Plan to wean off of opioids This plan should start within one week post op of your joint replacement. Maintain the same interval or time between taking each dose and first decrease the dose.  Cut the total  daily intake of opioids by one tablet each day Next start to increase the time between doses. The last dose that should be eliminated is the evening dose.           Follow-up Information     Tarry Kos, MD Follow up in 2 week(s).   Specialty: Orthopedic Surgery Why: For suture removal, For wound re-check Contact information: [address] [place] [address] [phone]                  Signed: Glee Arvin 10/19/2021, 7:29 AM

## 2021-10-20 ENCOUNTER — Telehealth: Payer: Self-pay | Admitting: *Deleted

## 2021-10-20 NOTE — Telephone Encounter (Signed)
Ortho bundle d/c call completed. 

## 2021-10-21 ENCOUNTER — Telehealth: Payer: Self-pay

## 2021-10-21 DIAGNOSIS — I10 Essential (primary) hypertension: Secondary | ICD-10-CM | POA: Diagnosis not present

## 2021-10-21 DIAGNOSIS — Z79891 Long term (current) use of opiate analgesic: Secondary | ICD-10-CM | POA: Diagnosis not present

## 2021-10-21 DIAGNOSIS — E781 Pure hyperglyceridemia: Secondary | ICD-10-CM | POA: Diagnosis not present

## 2021-10-21 DIAGNOSIS — Z7901 Long term (current) use of anticoagulants: Secondary | ICD-10-CM | POA: Diagnosis not present

## 2021-10-21 DIAGNOSIS — M7501 Adhesive capsulitis of right shoulder: Secondary | ICD-10-CM | POA: Diagnosis not present

## 2021-10-21 DIAGNOSIS — M7742 Metatarsalgia, left foot: Secondary | ICD-10-CM | POA: Diagnosis not present

## 2021-10-21 DIAGNOSIS — K219 Gastro-esophageal reflux disease without esophagitis: Secondary | ICD-10-CM | POA: Diagnosis not present

## 2021-10-21 DIAGNOSIS — Z471 Aftercare following joint replacement surgery: Secondary | ICD-10-CM | POA: Diagnosis not present

## 2021-10-21 DIAGNOSIS — Z96651 Presence of right artificial knee joint: Secondary | ICD-10-CM | POA: Diagnosis not present

## 2021-10-21 DIAGNOSIS — M21962 Unspecified acquired deformity of left lower leg: Secondary | ICD-10-CM | POA: Diagnosis not present

## 2021-10-21 DIAGNOSIS — M4726 Other spondylosis with radiculopathy, lumbar region: Secondary | ICD-10-CM | POA: Diagnosis not present

## 2021-10-21 DIAGNOSIS — M159 Polyosteoarthritis, unspecified: Secondary | ICD-10-CM | POA: Diagnosis not present

## 2021-10-21 DIAGNOSIS — S46811D Strain of other muscles, fascia and tendons at shoulder and upper arm level, right arm, subsequent encounter: Secondary | ICD-10-CM | POA: Diagnosis not present

## 2021-10-21 DIAGNOSIS — M810 Age-related osteoporosis without current pathological fracture: Secondary | ICD-10-CM | POA: Diagnosis not present

## 2021-10-21 MED ORDER — KETOROLAC TROMETHAMINE 10 MG PO TABS: 10.0000 mg | ORAL_TABLET | Freq: Two times a day (BID) | ORAL | 0 refills | Status: DC | PRN

## 2021-10-21 MED ORDER — HYDROMORPHONE HCL 2 MG PO TABS: 2.0000 mg | ORAL_TABLET | ORAL | 0 refills | Status: DC | PRN

## 2021-10-21 NOTE — Addendum Note (Signed)
Addended by: Azucena Cecil on: 10/21/2021 09:22 PM   Modules accepted: Orders

## 2021-10-21 NOTE — Telephone Encounter (Signed)
[facility] called stating that patient is having 10/10 pain with her right knee and that the Oxycodone is not helping.  Patient had right knee surgery on Monday, 10/18/2021.    Cb# [phone].  Please advise.  Thank you

## 2021-10-21 NOTE — Telephone Encounter (Signed)
I sent toradol and dialudid

## 2021-10-22 ENCOUNTER — Telehealth: Payer: Self-pay

## 2021-10-22 ENCOUNTER — Other Ambulatory Visit: Payer: Self-pay | Admitting: *Deleted

## 2021-10-22 DIAGNOSIS — S46811D Strain of other muscles, fascia and tendons at shoulder and upper arm level, right arm, subsequent encounter: Secondary | ICD-10-CM | POA: Diagnosis not present

## 2021-10-22 DIAGNOSIS — Z7901 Long term (current) use of anticoagulants: Secondary | ICD-10-CM | POA: Diagnosis not present

## 2021-10-22 DIAGNOSIS — M1711 Unilateral primary osteoarthritis, right knee: Secondary | ICD-10-CM

## 2021-10-22 DIAGNOSIS — K219 Gastro-esophageal reflux disease without esophagitis: Secondary | ICD-10-CM | POA: Diagnosis not present

## 2021-10-22 DIAGNOSIS — Z96651 Presence of right artificial knee joint: Secondary | ICD-10-CM | POA: Diagnosis not present

## 2021-10-22 DIAGNOSIS — M810 Age-related osteoporosis without current pathological fracture: Secondary | ICD-10-CM | POA: Diagnosis not present

## 2021-10-22 DIAGNOSIS — M21962 Unspecified acquired deformity of left lower leg: Secondary | ICD-10-CM | POA: Diagnosis not present

## 2021-10-22 DIAGNOSIS — M7742 Metatarsalgia, left foot: Secondary | ICD-10-CM | POA: Diagnosis not present

## 2021-10-22 DIAGNOSIS — Z471 Aftercare following joint replacement surgery: Secondary | ICD-10-CM | POA: Diagnosis not present

## 2021-10-22 DIAGNOSIS — M7501 Adhesive capsulitis of right shoulder: Secondary | ICD-10-CM | POA: Diagnosis not present

## 2021-10-22 DIAGNOSIS — M159 Polyosteoarthritis, unspecified: Secondary | ICD-10-CM | POA: Diagnosis not present

## 2021-10-22 DIAGNOSIS — M4726 Other spondylosis with radiculopathy, lumbar region: Secondary | ICD-10-CM | POA: Diagnosis not present

## 2021-10-22 DIAGNOSIS — Z79891 Long term (current) use of opiate analgesic: Secondary | ICD-10-CM | POA: Diagnosis not present

## 2021-10-22 DIAGNOSIS — I10 Essential (primary) hypertension: Secondary | ICD-10-CM | POA: Diagnosis not present

## 2021-10-22 DIAGNOSIS — E781 Pure hyperglyceridemia: Secondary | ICD-10-CM | POA: Diagnosis not present

## 2021-10-22 NOTE — Telephone Encounter (Signed)
Jennifer from [facility] called and stated pt is still having 10/10 pain after taking dilaudid. Please call pt back to discuss

## 2021-10-22 NOTE — Telephone Encounter (Signed)
Spoke with patient's son. He picked up scripts from pharmacy. He was advised that patient should not take the Toradol because it can interfere with the Xarelto she is currently on, per Dr.Xu. She will take the hydromorphone in place of the oxycodone.

## 2021-10-25 ENCOUNTER — Other Ambulatory Visit: Payer: Self-pay

## 2021-10-25 ENCOUNTER — Telehealth: Payer: Self-pay

## 2021-10-25 DIAGNOSIS — Z7901 Long term (current) use of anticoagulants: Secondary | ICD-10-CM | POA: Diagnosis not present

## 2021-10-25 DIAGNOSIS — K219 Gastro-esophageal reflux disease without esophagitis: Secondary | ICD-10-CM | POA: Diagnosis not present

## 2021-10-25 DIAGNOSIS — Z79891 Long term (current) use of opiate analgesic: Secondary | ICD-10-CM | POA: Diagnosis not present

## 2021-10-25 DIAGNOSIS — E781 Pure hyperglyceridemia: Secondary | ICD-10-CM | POA: Diagnosis not present

## 2021-10-25 DIAGNOSIS — M21962 Unspecified acquired deformity of left lower leg: Secondary | ICD-10-CM | POA: Diagnosis not present

## 2021-10-25 DIAGNOSIS — S46811D Strain of other muscles, fascia and tendons at shoulder and upper arm level, right arm, subsequent encounter: Secondary | ICD-10-CM | POA: Diagnosis not present

## 2021-10-25 DIAGNOSIS — M79661 Pain in right lower leg: Secondary | ICD-10-CM

## 2021-10-25 DIAGNOSIS — M4726 Other spondylosis with radiculopathy, lumbar region: Secondary | ICD-10-CM | POA: Diagnosis not present

## 2021-10-25 DIAGNOSIS — I10 Essential (primary) hypertension: Secondary | ICD-10-CM | POA: Diagnosis not present

## 2021-10-25 DIAGNOSIS — M159 Polyosteoarthritis, unspecified: Secondary | ICD-10-CM | POA: Diagnosis not present

## 2021-10-25 DIAGNOSIS — Z96651 Presence of right artificial knee joint: Secondary | ICD-10-CM | POA: Diagnosis not present

## 2021-10-25 DIAGNOSIS — Z471 Aftercare following joint replacement surgery: Secondary | ICD-10-CM | POA: Diagnosis not present

## 2021-10-25 DIAGNOSIS — M810 Age-related osteoporosis without current pathological fracture: Secondary | ICD-10-CM | POA: Diagnosis not present

## 2021-10-25 DIAGNOSIS — M7501 Adhesive capsulitis of right shoulder: Secondary | ICD-10-CM | POA: Diagnosis not present

## 2021-10-25 DIAGNOSIS — M7742 Metatarsalgia, left foot: Secondary | ICD-10-CM | POA: Diagnosis not present

## 2021-10-25 NOTE — Telephone Encounter (Signed)
Spoke to patient's son and pain level is much better.  HH physical therapist concerned about DVT.  Please order urgent doppler thanks.

## 2021-10-25 NOTE — Telephone Encounter (Signed)
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  Patient has been having right ankle pain since yesterday, with pain being 7/10.   CB# for patient is [phone].  CB# for Anderson Malta is [phone].  Please advise.  Thank you.

## 2021-10-27 ENCOUNTER — Emergency Department (HOSPITAL_BASED_OUTPATIENT_CLINIC_OR_DEPARTMENT_OTHER)
Admission: EM | Admit: 2021-10-27 | Discharge: 2021-10-27 | Disposition: A | Discharge status: Home / Self Care | Payer: Medicare Other | Attending: Emergency Medicine | Admitting: Emergency Medicine

## 2021-10-27 ENCOUNTER — Emergency Department (HOSPITAL_BASED_OUTPATIENT_CLINIC_OR_DEPARTMENT_OTHER): Payer: Medicare Other

## 2021-10-27 ENCOUNTER — Other Ambulatory Visit: Payer: Self-pay

## 2021-10-27 ENCOUNTER — Encounter (HOSPITAL_BASED_OUTPATIENT_CLINIC_OR_DEPARTMENT_OTHER): Payer: Self-pay

## 2021-10-27 ENCOUNTER — Telehealth: Payer: Self-pay | Admitting: Radiology

## 2021-10-27 DIAGNOSIS — M7742 Metatarsalgia, left foot: Secondary | ICD-10-CM | POA: Diagnosis not present

## 2021-10-27 DIAGNOSIS — Z79891 Long term (current) use of opiate analgesic: Secondary | ICD-10-CM | POA: Diagnosis not present

## 2021-10-27 DIAGNOSIS — M79604 Pain in right leg: Secondary | ICD-10-CM | POA: Diagnosis not present

## 2021-10-27 DIAGNOSIS — M21962 Unspecified acquired deformity of left lower leg: Secondary | ICD-10-CM | POA: Diagnosis not present

## 2021-10-27 DIAGNOSIS — M159 Polyosteoarthritis, unspecified: Secondary | ICD-10-CM | POA: Diagnosis not present

## 2021-10-27 DIAGNOSIS — K219 Gastro-esophageal reflux disease without esophagitis: Secondary | ICD-10-CM | POA: Diagnosis not present

## 2021-10-27 DIAGNOSIS — R0602 Shortness of breath: Secondary | ICD-10-CM | POA: Diagnosis not present

## 2021-10-27 DIAGNOSIS — Z79899 Other long term (current) drug therapy: Secondary | ICD-10-CM | POA: Insufficient documentation

## 2021-10-27 DIAGNOSIS — E781 Pure hyperglyceridemia: Secondary | ICD-10-CM | POA: Diagnosis not present

## 2021-10-27 DIAGNOSIS — M7501 Adhesive capsulitis of right shoulder: Secondary | ICD-10-CM | POA: Diagnosis not present

## 2021-10-27 DIAGNOSIS — M4726 Other spondylosis with radiculopathy, lumbar region: Secondary | ICD-10-CM | POA: Diagnosis not present

## 2021-10-27 DIAGNOSIS — Z96651 Presence of right artificial knee joint: Secondary | ICD-10-CM | POA: Insufficient documentation

## 2021-10-27 DIAGNOSIS — S46811D Strain of other muscles, fascia and tendons at shoulder and upper arm level, right arm, subsequent encounter: Secondary | ICD-10-CM | POA: Diagnosis not present

## 2021-10-27 DIAGNOSIS — Z7901 Long term (current) use of anticoagulants: Secondary | ICD-10-CM | POA: Diagnosis not present

## 2021-10-27 DIAGNOSIS — R0600 Dyspnea, unspecified: Secondary | ICD-10-CM

## 2021-10-27 DIAGNOSIS — M7989 Other specified soft tissue disorders: Secondary | ICD-10-CM | POA: Diagnosis not present

## 2021-10-27 DIAGNOSIS — Z471 Aftercare following joint replacement surgery: Secondary | ICD-10-CM | POA: Diagnosis not present

## 2021-10-27 DIAGNOSIS — I10 Essential (primary) hypertension: Secondary | ICD-10-CM | POA: Insufficient documentation

## 2021-10-27 DIAGNOSIS — K449 Diaphragmatic hernia without obstruction or gangrene: Secondary | ICD-10-CM | POA: Diagnosis not present

## 2021-10-27 DIAGNOSIS — M810 Age-related osteoporosis without current pathological fracture: Secondary | ICD-10-CM | POA: Diagnosis not present

## 2021-10-27 LAB — CBC WITH DIFFERENTIAL/PLATELET
Abs Immature Granulocytes: 0.05 10*3/uL (ref 0.00–0.07)
Basophils Absolute: 0 10*3/uL (ref 0.0–0.1)
Basophils Relative: 0 %
Eosinophils Absolute: 0.1 10*3/uL (ref 0.0–0.5)
Eosinophils Relative: 2 %
HCT: 30.6 % — ABNORMAL LOW (ref 36.0–46.0)
Hemoglobin: 9.8 g/dL — ABNORMAL LOW (ref 12.0–15.0)
Immature Granulocytes: 1 %
Lymphocytes Relative: 18 %
Lymphs Abs: 1.2 10*3/uL (ref 0.7–4.0)
MCH: 29.3 pg (ref 26.0–34.0)
MCHC: 32 g/dL (ref 30.0–36.0)
MCV: 91.3 fL (ref 80.0–100.0)
Monocytes Absolute: 0.6 10*3/uL (ref 0.1–1.0)
Monocytes Relative: 9 %
Neutro Abs: 4.7 10*3/uL (ref 1.7–7.7)
Neutrophils Relative %: 70 %
Platelets: 400 10*3/uL (ref 150–400)
RBC: 3.35 MIL/uL — ABNORMAL LOW (ref 3.87–5.11)
RDW: 13.4 % (ref 11.5–15.5)
WBC: 6.7 10*3/uL (ref 4.0–10.5)
nRBC: 0 % (ref 0.0–0.2)

## 2021-10-27 LAB — COMPREHENSIVE METABOLIC PANEL
ALT: 7 U/L (ref 0–44)
AST: 16 U/L (ref 15–41)
Albumin: 3.4 g/dL — ABNORMAL LOW (ref 3.5–5.0)
Alkaline Phosphatase: 46 U/L (ref 38–126)
Anion gap: 7 (ref 5–15)
BUN: 23 mg/dL (ref 8–23)
CO2: 29 mmol/L (ref 22–32)
Calcium: 9 mg/dL (ref 8.9–10.3)
Chloride: 99 mmol/L (ref 98–111)
Creatinine, Ser: 0.92 mg/dL (ref 0.44–1.00)
GFR, Estimated: >60 mL/min (ref >60)
Glucose, Bld: 152 mg/dL — ABNORMAL HIGH (ref 70–99)
Potassium: 4 mmol/L (ref 3.5–5.1)
Sodium: 135 mmol/L (ref 135–145)
Total Bilirubin: 0.6 mg/dL (ref 0.3–1.2)
Total Protein: 6.6 g/dL (ref 6.5–8.1)

## 2021-10-27 LAB — BRAIN NATRIURETIC PEPTIDE: B Natriuretic Peptide: 32.8 pg/mL (ref 0.0–100.0)

## 2021-10-27 LAB — TROPONIN I (HIGH SENSITIVITY): Troponin I (High Sensitivity): 5 ng/L (ref <18)

## 2021-10-27 MED ORDER — IOHEXOL 350 MG/ML SOLN
75.0000 mL | Freq: Once | INTRAVENOUS | Status: AC | PRN
  Administered 2021-10-27: 75 mL via INTRAVENOUS

## 2021-10-27 MED ORDER — OXYCODONE HCL 5 MG PO TABS
5.0000 mg | ORAL_TABLET | Freq: Once | ORAL | Status: AC
  Administered 2021-10-27: 5 mg via ORAL
  Filled 2021-10-27: qty 1

## 2021-10-27 MED ORDER — ACETAMINOPHEN 500 MG PO TABS
1000.0000 mg | ORAL_TABLET | Freq: Once | ORAL | Status: AC
Start: 2021-10-27 — End: 2021-10-27
  Administered 2021-10-27: 1000 mg via ORAL
  Filled 2021-10-27: qty 2

## 2021-10-27 NOTE — Telephone Encounter (Signed)
Jennifer from [facility] is calling for patient, states that patient is having 8/10 pain despite taking pain meds @ 7am, 1-1.5 hours after meds pain is at 6/10, also she is reporting Shortness of Breath with laying down. Please call Anderson Malta at [phone]   or patient @ [phone] is there now with her

## 2021-10-27 NOTE — Telephone Encounter (Signed)
Spoke with patient's son. Patient declined having an ultrasound on Monday to rule out DVT.  She is now having shortness of breath.  Advised for her to go to the emergency room now. He states understanding and is going to take her to the ED on [address] in [place].

## 2021-10-27 NOTE — ED Provider Notes (Signed)
[facility] Provider Note   CSN: [id number] Arrival date & time: 10/27/21  1415     History {Add pertinent medical, surgical, social history, OB history to HPI:1} Chief Complaint  Patient presents with   Leg Pain   Shortness of Breath    Amber Mccall is a 71 y.o. female.  HPI    71 year old female who speaks [place] with history of hypertension, right total knee arthroplasty on June 26 with Dr. Erlinda Hong, who presents with concern for shortness of breath.  Reports over the last 5 days, she has developed shortness of breath, which is present when she is walking around as well as when she is resting.  It is worse when she lays down flat.  She has had some mild mucus she is coughed up since the surgery, but did not have before.  Denies any significant cough or worsening cough.  Denies fevers.  Does note she has had some chest pain, which feels like a "blockage" or fullness to her lower chest.  This pain is not changed by position, exertion, or deep breaths.  Denies associated nausea, vomiting, abdominal pain, black or bloody stools, diarrhea.  She has had continuing pain in her right leg since her surgery.  She is sent to the emergency department by her orthopedic providers for further evaluation.  Family is translating at bedside. Offered interpreter but they declined.   Past Medical History:  Diagnosis Date   Anginal pain ([place])    pt says she feels like this may be related to acid reflux   Closed fracture of metatarsal bone(s) 01/30/2013   Depression    Essential hypertension    GERD (gastroesophageal reflux disease)    Lumbar radiculopathy    Osteoarthritis    Osteoporosis      Home Medications Prior to Admission medications   Medication Sig Start Date End Date Taking? Authorizing Provider  amLODipine (NORVASC) 10 MG tablet TAKE 1 TABLET(10 MG) BY MOUTH DAILY 05/02/16   [provider]  calcium citrate (CALCITRATE - DOSED IN MG ELEMENTAL CALCIUM)  950 (200 Ca) MG tablet Take 1 tablet by mouth daily.    [provider]  diclofenac Sodium (VOLTAREN) 1 % GEL Apply 4 g topically 4 (four) times daily. Patient taking differently: Apply 4 g topically 4 (four) times daily as needed (pain). 08/18/21   Shelda Pal, DO  docusate sodium (COLACE) 100 MG capsule Take 1 capsule (100 mg total) by mouth daily as needed. 10/11/21 10/11/22  Aundra Dubin, PA-C  gabapentin (NEURONTIN) 100 MG capsule TAKE 1 CAPSULE(100 MG) BY MOUTH AT BEDTIME 03/03/21   [provider]  HYDROcodone-acetaminophen (NORCO/VICODIN) 5-325 MG tablet Take 1 tablet by mouth every 6 (six) hours as needed for moderate pain. 01/30/18   [provider]  HYDROmorphone (DILAUDID) 2 MG tablet Take 1 tablet (2 mg total) by mouth every 4 (four) hours as needed for severe pain. 10/21/21   Leandrew Koyanagi, MD  ketorolac (TORADOL) 10 MG tablet Take 1 tablet (10 mg total) by mouth 2 (two) times daily as needed. 10/21/21   Leandrew Koyanagi, MD  meloxicam (MOBIC) 15 MG tablet Take 15 mg by mouth daily. Patient not taking: Reported on 10/05/2021    [provider]  methocarbamol (ROBAXIN-750) 750 MG tablet Take 1 tablet (750 mg total) by mouth 2 (two) times daily as needed for muscle spasms. 10/11/21   Aundra Dubin, PA-C  ondansetron (ZOFRAN) 4 MG tablet Take 1  tablet (4 mg total) by mouth every 8 (eight) hours as needed for nausea or vomiting. 10/11/21   Aundra Dubin, PA-C  oxyCODONE-acetaminophen (PERCOCET) 5-325 MG tablet Take 1-2 tablets by mouth every 6 (six) hours as needed. To be taken after surgery 10/11/21   Aundra Dubin, PA-C  pantoprazole (PROTONIX) 40 MG tablet Take 1 tablet (40 mg total) by mouth daily. 10/07/21   Jackquline Denmark, MD  rivaroxaban (XARELTO) 10 MG TABS tablet Take 1 tablet (10 mg total) by mouth daily. To be taken after surgery to prevent blood clots 10/15/21   Aundra Dubin, PA-C  sertraline (ZOLOFT) 50 MG tablet Take 50 mg by  mouth daily. 04/13/20   [provider]  tiZANidine (ZANAFLEX) 4 MG tablet Take 4 mg by mouth every 8 (eight) hours as needed for muscle spasms.    [provider]  traZODone (DESYREL) 100 MG tablet Take 100 mg by mouth at bedtime. 05/15/21   [provider]  VITAMIN D, CHOLECALCIFEROL, PO Take 1 capsule by mouth daily.    [provider]      Allergies    Patient has no known allergies.    Review of Systems   Review of Systems  Physical Exam Updated Vital Signs BP (!) 136/59   Pulse 76   Temp 99 F (37.2 C) (Oral)   Resp 18   Ht 5' (1.524 m)   Wt 63 kg   SpO2 93%   BMI 27.15 kg/m  Physical Exam Vitals and nursing note reviewed.  Constitutional:      General: She is not in acute distress.    Appearance: She is well-developed. She is not diaphoretic.  HENT:     Head: Normocephalic and atraumatic.  Eyes:     Conjunctiva/sclera: Conjunctivae normal.  Cardiovascular:     Rate and Rhythm: Normal rate and regular rhythm.     Heart sounds: Normal heart sounds. No murmur heard.    No friction rub. No gallop.  Pulmonary:     Effort: Pulmonary effort is normal. No respiratory distress.     Breath sounds: Normal breath sounds. No wheezing or rales.  Abdominal:     General: There is no distension.     Palpations: Abdomen is soft.     Tenderness: There is no abdominal tenderness. There is no guarding.  Musculoskeletal:        General: No tenderness.     Cervical back: Normal range of motion.     Right lower leg: Edema present.     Left lower leg: Edema present.     Comments: Contusions over RLE, bandage in place, incision without signs of erythema  Skin:    General: Skin is warm and dry.     Findings: No erythema or rash.  Neurological:     Mental Status: She is alert and oriented to person, place, and time.     ED Results / Procedures / Treatments   Labs (all labs ordered are listed, but only abnormal results are displayed) Labs  Reviewed  CBC WITH DIFFERENTIAL/PLATELET - Abnormal; Notable for the following components:      Result Value   RBC 3.35 (*)    Hemoglobin 9.8 (*)    HCT 30.6 (*)    All other components within normal limits  COMPREHENSIVE METABOLIC PANEL - Abnormal; Notable for the following components:   Glucose, Bld 152 (*)    Albumin 3.4 (*)    All other components within normal limits  BRAIN NATRIURETIC PEPTIDE  TROPONIN I (HIGH SENSITIVITY)    EKG EKG Interpretation  Date/Time:  Wednesday October 27 2021 14:37:48 EDT Ventricular Rate:  72 PR Interval:  142 QRS Duration: 86 QT Interval:  382 QTC Calculation: 418 R Axis:   72 Text Interpretation: Normal sinus rhythm Nonspecific T wave abnormality Abnormal ECG When compared with ECG of 12-Oct-2021 11:09, PREVIOUS ECG IS PRESENT No significant change since last tracing Confirmed by Gareth Morgan [phone]) on 10/27/2021 3:08:32 PM  Radiology US Venous Img Lower Unilateral Right  Result Date: 10/27/2021 CLINICAL DATA:  Right leg pain and swelling EXAM: RIGHT LOWER EXTREMITY VENOUS DOPPLER ULTRASOUND TECHNIQUE: Gray-scale sonography with graded compression, as well as color Doppler and duplex ultrasound were performed to evaluate the lower extremity deep venous systems from the level of the common femoral vein and including the common femoral, femoral, profunda femoral, popliteal and calf veins including the posterior tibial, peroneal and gastrocnemius veins when visible. The superficial great saphenous vein was also interrogated. Spectral Doppler was utilized to evaluate flow at rest and with distal augmentation maneuvers in the common femoral, femoral and popliteal veins. COMPARISON:  None Available. FINDINGS: Contralateral Common Femoral Vein: Respiratory phasicity is normal and symmetric with the symptomatic side. No evidence of thrombus. Normal compressibility. Common Femoral Vein: No evidence of thrombus. Normal compressibility, respiratory phasicity and  response to augmentation. Saphenofemoral Junction: No evidence of thrombus. Normal compressibility and flow on color Doppler imaging. Profunda Femoral Vein: No evidence of thrombus. Normal compressibility and flow on color Doppler imaging. Femoral Vein: No evidence of thrombus. Normal compressibility, respiratory phasicity and response to augmentation. Popliteal Vein: No evidence of thrombus. Normal compressibility, respiratory phasicity and response to augmentation. Calf Veins: No evidence of thrombus. Normal compressibility and flow on color Doppler imaging. IMPRESSION: No evidence of deep venous thrombosis. Electronically Signed   By: Jerilynn Mages.  Shick M.D.   On: 10/27/2021 15:23   DG Chest 2 View  Result Date: 10/27/2021 CLINICAL DATA:  Shortness of breath EXAM: CHEST - 2 VIEW COMPARISON:  Radiograph 08/20/2015 FINDINGS: Unchanged cardiomediastinal silhouette. There is no focal airspace consolidation. There is no pleural effusion. No pneumothorax. There is no acute osseous abnormality. Focal kyphosis at T12 due to a prior compression fracture with changes of vertebroplasty. There are moderate multilevel degenerative changes of the thoracic spine. IMPRESSION: No evidence of acute cardiopulmonary disease. Electronically Signed   By: Maurine Simmering M.D.   On: 10/27/2021 15:00    Procedures Procedures  {Document cardiac monitor, telemetry assessment procedure when appropriate:1}  Medications Ordered in ED Medications  iohexol (OMNIPAQUE) 350 MG/ML injection 75 mL (has no administration in time range)    ED Course/ Medical Decision Making/ A&P                           Medical Decision Making Amount and/or Complexity of Data Reviewed Labs: ordered. Radiology: ordered.  Risk Prescription drug management.    71 year old female who speaks [place] with history of hypertension, right total knee arthroplasty on June 26 with Dr. Erlinda Hong, who presents with concern for shortness of breath.  Differential diagnosis  for dyspnea includes ACS, PE, COPD exacerbation, CHF exacerbation, anemia, pneumonia, viral etiology such as COVID 19 infection, metabolic abnormality.  Chest x-ray was done and personally evaluated interpreted by me and shows no acute findings pe. EKG was evaluated by me which showed no acute ST changes, no signs of pericarditis.  Troponin negative, doubt ACS.  BNP was***.  Labs personally evaluated interpreted by me show no acute kidney injury, no significant electrolyte abnormality.  Hemoglobin is decreased to 9.8, from 12.5 previous to her surgery, and 11.7 after surgery.  Denies black or bloody stools, and have low suspicion at this time for significant GI bleed.  DVT study completed and showed no evidence of DVT.  CTA PE study was completed given recent surgery, and dyspnea with high risk and shows***.  {Document critical care time when appropriate:1} {Document review of labs and clinical decision tools ie heart score, Chads2Vasc2 etc:1}  {Document your independent review of radiology images, and any outside records:1} {Document your discussion with family members, caretakers, and with consultants:1} {Document social determinants of health affecting pt's care:1} {Document your decision making why or why not admission, treatments were needed:1} Final Clinical Impression(s) / ED Diagnoses Final diagnoses:  None    Rx / DC Orders ED Discharge Orders     None

## 2021-10-27 NOTE — Telephone Encounter (Signed)
Thanks.  Agree that she should go to ED asap.

## 2021-10-27 NOTE — ED Triage Notes (Addendum)
Had right knee replacement on 6/26, states pain continues & swelling. Started having SOB without exertion x 5 days. Doctor concerned for blood clot.  Has pain down entire right leg

## 2021-10-27 NOTE — ED Notes (Signed)
Patient transported to CT 

## 2021-10-29 DIAGNOSIS — S46811D Strain of other muscles, fascia and tendons at shoulder and upper arm level, right arm, subsequent encounter: Secondary | ICD-10-CM | POA: Diagnosis not present

## 2021-10-29 DIAGNOSIS — Z471 Aftercare following joint replacement surgery: Secondary | ICD-10-CM | POA: Diagnosis not present

## 2021-10-29 DIAGNOSIS — E781 Pure hyperglyceridemia: Secondary | ICD-10-CM | POA: Diagnosis not present

## 2021-10-29 DIAGNOSIS — M4726 Other spondylosis with radiculopathy, lumbar region: Secondary | ICD-10-CM | POA: Diagnosis not present

## 2021-10-29 DIAGNOSIS — Z79891 Long term (current) use of opiate analgesic: Secondary | ICD-10-CM | POA: Diagnosis not present

## 2021-10-29 DIAGNOSIS — M7742 Metatarsalgia, left foot: Secondary | ICD-10-CM | POA: Diagnosis not present

## 2021-10-29 DIAGNOSIS — M21962 Unspecified acquired deformity of left lower leg: Secondary | ICD-10-CM | POA: Diagnosis not present

## 2021-10-29 DIAGNOSIS — Z7901 Long term (current) use of anticoagulants: Secondary | ICD-10-CM | POA: Diagnosis not present

## 2021-10-29 DIAGNOSIS — M810 Age-related osteoporosis without current pathological fracture: Secondary | ICD-10-CM | POA: Diagnosis not present

## 2021-10-29 DIAGNOSIS — M7501 Adhesive capsulitis of right shoulder: Secondary | ICD-10-CM | POA: Diagnosis not present

## 2021-10-29 DIAGNOSIS — K219 Gastro-esophageal reflux disease without esophagitis: Secondary | ICD-10-CM | POA: Diagnosis not present

## 2021-10-29 DIAGNOSIS — M159 Polyosteoarthritis, unspecified: Secondary | ICD-10-CM | POA: Diagnosis not present

## 2021-10-29 DIAGNOSIS — I10 Essential (primary) hypertension: Secondary | ICD-10-CM | POA: Diagnosis not present

## 2021-10-29 DIAGNOSIS — Z96651 Presence of right artificial knee joint: Secondary | ICD-10-CM | POA: Diagnosis not present

## 2021-11-01 ENCOUNTER — Telehealth: Payer: Self-pay | Admitting: Orthopaedic Surgery

## 2021-11-01 DIAGNOSIS — M21962 Unspecified acquired deformity of left lower leg: Secondary | ICD-10-CM | POA: Diagnosis not present

## 2021-11-01 DIAGNOSIS — E781 Pure hyperglyceridemia: Secondary | ICD-10-CM | POA: Diagnosis not present

## 2021-11-01 DIAGNOSIS — M159 Polyosteoarthritis, unspecified: Secondary | ICD-10-CM | POA: Diagnosis not present

## 2021-11-01 DIAGNOSIS — M810 Age-related osteoporosis without current pathological fracture: Secondary | ICD-10-CM | POA: Diagnosis not present

## 2021-11-01 DIAGNOSIS — S46811D Strain of other muscles, fascia and tendons at shoulder and upper arm level, right arm, subsequent encounter: Secondary | ICD-10-CM | POA: Diagnosis not present

## 2021-11-01 DIAGNOSIS — Z471 Aftercare following joint replacement surgery: Secondary | ICD-10-CM | POA: Diagnosis not present

## 2021-11-01 DIAGNOSIS — M7501 Adhesive capsulitis of right shoulder: Secondary | ICD-10-CM | POA: Diagnosis not present

## 2021-11-01 DIAGNOSIS — I10 Essential (primary) hypertension: Secondary | ICD-10-CM | POA: Diagnosis not present

## 2021-11-01 DIAGNOSIS — Z96651 Presence of right artificial knee joint: Secondary | ICD-10-CM | POA: Diagnosis not present

## 2021-11-01 DIAGNOSIS — M7742 Metatarsalgia, left foot: Secondary | ICD-10-CM | POA: Diagnosis not present

## 2021-11-01 DIAGNOSIS — K219 Gastro-esophageal reflux disease without esophagitis: Secondary | ICD-10-CM | POA: Diagnosis not present

## 2021-11-01 DIAGNOSIS — Z79891 Long term (current) use of opiate analgesic: Secondary | ICD-10-CM | POA: Diagnosis not present

## 2021-11-01 DIAGNOSIS — Z7901 Long term (current) use of anticoagulants: Secondary | ICD-10-CM | POA: Diagnosis not present

## 2021-11-01 DIAGNOSIS — M4726 Other spondylosis with radiculopathy, lumbar region: Secondary | ICD-10-CM | POA: Diagnosis not present

## 2021-11-01 NOTE — Telephone Encounter (Signed)
Received call from Verdia Kuba (PT) advised patient is being discharged from [facility] today due to completing plan of care. Anderson Malta advised patient is starting ([facility]) this week. The number to contact Anderson Malta if needed is [phone]

## 2021-11-02 ENCOUNTER — Telehealth: Payer: Self-pay | Admitting: *Deleted

## 2021-11-02 ENCOUNTER — Ambulatory Visit (INDEPENDENT_AMBULATORY_CARE_PROVIDER_SITE_OTHER): Payer: Medicare Other | Admitting: Orthopaedic Surgery

## 2021-11-02 ENCOUNTER — Encounter: Payer: Self-pay | Admitting: Orthopaedic Surgery

## 2021-11-02 DIAGNOSIS — Z96651 Presence of right artificial knee joint: Secondary | ICD-10-CM

## 2021-11-02 MED ORDER — OXYCODONE-ACETAMINOPHEN 5-325 MG PO TABS: 1.0000 | ORAL_TABLET | Freq: Every day | ORAL | 0 refills | Status: DC | PRN

## 2021-11-02 NOTE — Progress Notes (Signed)
Post-Op Visit Note   Patient: Amber Mccall           Date of Birth: 1951/03/19           MRN: [id number] Visit Date: 11/02/2021 PCP: Shelda Pal, DO   Assessment & Plan:  Chief Complaint:  Chief Complaint  Patient presents with   Right Knee - Follow-up    Right total knee arthroplasty 10/18/2021   Visit Diagnoses:  1. Status post total right knee replacement     Plan: Patient is 2-week status post right total knee on 10/18/2021.  Doing okay overall.  Work-up for DVT and PE was negative in the ER.  Shortness of breath has improved.  Main complaint is itching around the skin due to the adhesive from the Aquacel.  Has completed home health PT.  Outpatient PT is scheduled for tomorrow.  Examination of right knee shows a healed surgical incision.  Adequate range of motion.  No signs of infection or drainage.  There is dermatitis from the adhesive.  Patient is doing well overall.  I have asked her to pick up 1% hydrocortisone cream to apply to the dermatitis but not the incision.  Sutures removed Steri-Strips applied.  Percocet refilled.  Down prophylaxis reinforced.  Handicap placard implant card provided.  Recheck the right knee in 4 weeks with repeat two-view x-rays of the right knee.  Follow-Up Instructions: Return in about 4 weeks (around 11/30/2021).   Orders:  No orders of the defined types were placed in this encounter.  Meds ordered this encounter  Medications   oxyCODONE-acetaminophen (PERCOCET) 5-325 MG tablet    Sig: Take 1-2 tablets by mouth daily as needed. To be taken after surgery    Dispense:  30 tablet    Refill:  0    Imaging: No results found.  PMFS History: Patient Active Problem List   Diagnosis Date Noted   Status post total right knee replacement 10/18/2021   Primary osteoarthritis of right knee 10/17/2021   Spondylosis without myelopathy or radiculopathy, lumbar region 09/28/2021   Chronic right shoulder pain 08/18/2021   Adhesive  capsulitis of right shoulder 08/18/2021   Gastroesophageal reflux disease 08/18/2021   Primary osteoarthritis of left knee 07/27/2021   Primary osteoarthritis involving multiple joints 07/13/2021   Strain of right trapezius muscle 07/13/2021   Essential hypertension 05/19/2021   Hypertriglyceridemia 05/19/2021   Osteoporosis 05/19/2021   Lumbar radiculopathy 05/19/2021   Osteoarthritis 05/19/2021   Depression, major, single episode, in partial remission ([place]) 05/19/2021   Chronic bilateral low back pain 05/19/2021   Status post left foot surgery 04/23/2015   Injury of foot, left 04/08/2015   Sprain of ankle 04/08/2015   Deformity of metatarsal bone of left foot 04/08/2015   Metatarsalgia of left foot 02/10/2015   Tenosynovitis of foot 02/10/2015   Chronic pain of both knees 02/10/2015   Closed fracture of metatarsal bone 10/21/2014   Edema of left foot 10/21/2014   Closed fracture of metatarsal bone(s) 01/30/2013   Pain, foot 01/30/2013   Abnormal foot finding 01/30/2013   Past Medical History:  Diagnosis Date   Anginal pain ([place])    pt says she feels like this may be related to acid reflux   Closed fracture of metatarsal bone(s) 01/30/2013   Depression    Essential hypertension    GERD (gastroesophageal reflux disease)    Lumbar radiculopathy    Osteoarthritis    Osteoporosis     Family History  Problem Relation Age  of Onset   Colon cancer Neg Hx    Rectal cancer Neg Hx    Stomach cancer Neg Hx     Past Surgical History:  Procedure Laterality Date   ABDOMINAL HYSTERECTOMY     APPENDECTOMY     CATARACT EXTRACTION Bilateral 2022   [place] Osteotomy w/ Bone Graft Left 04/16/2015   Left foot   IR RADIOLOGIST EVAL & MGMT  04/11/2017   OPEN REDUCTION INTERNAL FIXATION (ORIF) DISTAL PHALANX Left 10/23/2014   5th Metatarsal   TOTAL KNEE ARTHROPLASTY Right 10/18/2021   Procedure: RIGHT TOTAL KNEE ARTHROPLASTY;  Surgeon: Leandrew Koyanagi, MD;  Location: [facility];  Service:  Orthopedics;  Laterality: Right;   Social History   Occupational History   Occupation: retired  Tobacco Use   Smoking status: Never   Smokeless tobacco: Never  Vaping Use   Vaping Use: Never used  Substance and Sexual Activity   Alcohol use: Not Currently   Drug use: Not Currently   Sexual activity: Not on file

## 2021-11-02 NOTE — Telephone Encounter (Signed)
Ortho bundle 14 day in office meeting completed today.

## 2021-11-03 ENCOUNTER — Encounter: Payer: Self-pay | Admitting: Rehabilitative and Restorative Service Providers"

## 2021-11-03 ENCOUNTER — Ambulatory Visit: Payer: Medicare Other | Attending: Orthopaedic Surgery | Admitting: Rehabilitative and Restorative Service Providers"

## 2021-11-03 ENCOUNTER — Other Ambulatory Visit: Payer: Self-pay

## 2021-11-03 DIAGNOSIS — M25561 Pain in right knee: Secondary | ICD-10-CM | POA: Diagnosis not present

## 2021-11-03 DIAGNOSIS — R600 Localized edema: Secondary | ICD-10-CM | POA: Diagnosis not present

## 2021-11-03 DIAGNOSIS — M1711 Unilateral primary osteoarthritis, right knee: Secondary | ICD-10-CM | POA: Diagnosis not present

## 2021-11-03 DIAGNOSIS — Z96651 Presence of right artificial knee joint: Secondary | ICD-10-CM | POA: Insufficient documentation

## 2021-11-03 DIAGNOSIS — M6281 Muscle weakness (generalized): Secondary | ICD-10-CM | POA: Insufficient documentation

## 2021-11-03 DIAGNOSIS — R2689 Other abnormalities of gait and mobility: Secondary | ICD-10-CM

## 2021-11-03 NOTE — Patient Instructions (Signed)
Access Code: [email or web address] URL: [email or web address] Date: 11/03/2021 Prepared by: Rudell Cobb  Exercises - Supine Heel Slides  - 1 x daily - 7 x weekly - 2 sets - 10 reps - Supine Quadricep Sets  - 1 x daily - 7 x weekly - 2 sets - 10 reps - Supine Active Straight Leg Raise  - 1 x daily - 7 x weekly - 2 sets - 10 reps - Seated Long Arc Quad  - 1 x daily - 7 x weekly - 2 sets - 10 reps - Seated Knee Flexion Stretch  - 1 x daily - 7 x weekly - 2 sets - 3 reps - 20 seconds hold

## 2021-11-03 NOTE — Therapy (Signed)
Amber Mccall, [place], [address] Phone: [phone]   Fax:  [phone]  Physical Therapy Evaluation  Patient Details  Name: Amber Mccall MRN: [id number] Date of Birth: Nov 24, 1950 Referring Provider (PT): Amber Mccall   Encounter Date: 11/03/2021   PT End of Session - 11/03/21 1007     Visit Number 1    Number of Visits 12    Date for PT Re-Evaluation 12/15/21    Authorization Type UHC medicare    Progress Note Due on Visit 10    PT Start Time 1010    PT Stop Time 1050    PT Time Calculation (min) 40 min    Activity Tolerance Patient tolerated treatment well;Patient limited by pain    Behavior During Therapy [facility] for tasks assessed/performed             Past Medical History:  Diagnosis Date   Anginal pain ([place])    pt says she feels like this may be related to acid reflux   Closed fracture of metatarsal bone(s) 01/30/2013   Depression    Essential hypertension    GERD (gastroesophageal reflux disease)    Lumbar radiculopathy    Osteoarthritis    Osteoporosis     Past Surgical History:  Procedure Laterality Date   ABDOMINAL HYSTERECTOMY     APPENDECTOMY     CATARACT EXTRACTION Bilateral 2022   [place] Osteotomy w/ Bone Graft Left 04/16/2015   Left foot   IR RADIOLOGIST EVAL & MGMT  04/11/2017   OPEN REDUCTION INTERNAL FIXATION (ORIF) DISTAL PHALANX Left 10/23/2014   5th Metatarsal   TOTAL KNEE ARTHROPLASTY Right 10/18/2021   Procedure: RIGHT TOTAL KNEE ARTHROPLASTY;  Surgeon: Leandrew Koyanagi, MD;  Location: [place];  Service: Orthopedics;  Laterality: Right;    There were no vitals filed for this visit.    Subjective Assessment - 11/03/21 1012     Subjective The patient arrives to outpatient PT today s/p R TKR on 10/18/21.  She is reporting 6/10 pain in her R knee at rest.  She completed HH PT prior to coming to OP.    Patient is accompained by: Interpreter   via [employer]-- [name] [phone]    Pertinent History HTN    Patient Stated Goals No pain and be able to walk.    Currently in Pain? Yes    Pain Score 6     Pain Location Knee    Pain Orientation Right    Pain Type Surgical pain;Acute pain    Pain Onset 1 to 4 weeks ago    Pain Frequency Constant    Aggravating Factors  walking    Pain Relieving Factors massage, pain meds, ice                [facility] PT Assessment - 11/03/21 1016       Assessment   Medical Diagnosis R TKR    Referring Provider (PT) Amber Mccall, Amber Mccall    Onset Date/Surgical Date 10/18/21      Precautions   Precautions None      Restrictions   Weight Bearing Restrictions Yes    RLE Weight Bearing Weight bearing as tolerated      Balance Screen   Has the patient fallen in the past 6 months No    Has the patient had a decrease in activity level because of a fear of falling?  No    Is the patient reluctant to leave their home because  of a fear of falling?  No      Home Environment   Living Environment Private residence    Living Arrangements Children    Type of [place] Access Stairs to enter    Entrance Stairs-Number of Steps 3    Entrance Stairs-Rails None    Home Layout One level    Home Equipment Walker - 4 wheels    Additional Comments only leaving home for MD appts due to no rails      Prior Function   Level of Independence Independent      Cognition   Overall Cognitive Status Within Functional Limits for tasks assessed      Observation/Other Assessments   Focus on Therapeutic Outcomes (FOTO)  n/a due to interpreter      Observation/Other Assessments-Edema    Edema Circumferential      Circumferential Edema   Circumferential - Right 15.5'    Circumferential - Left  14"      Sensation   Light Touch Appears Intact      ROM / Strength   AROM / PROM / Strength AROM;PROM;Strength      AROM   Overall AROM  Deficits    AROM Assessment Site Knee    Right/Left Knee Right    Right Knee Extension -20   knee extension  lag   Right Knee Flexion 96      PROM   Overall PROM  Deficits    PROM Assessment Site Knee    Right/Left Knee Right    Right Knee Extension -12    Right Knee Flexion 105      Strength   Overall Strength Deficits    Overall Strength Comments can lift through partial ROM against gravity      Flexibility   Soft Tissue Assessment /Muscle Length yes    Hamstrings tightness t/o R hamstrings      Palpation   Palpation comment tenderness with mild edema around R knee      Ambulation/Gait   Ambulation/Gait Yes    Ambulation/Gait Assistance 6: Modified independent (Device/Increase time)    Ambulation Distance (Feet) 200 Feet    Assistive device Rollator    Gait Pattern Decreased stride length;Decreased step length - right;Decreased weight shift to right    Ambulation Surface Level;Indoor    Gait velocity 1.28 ft/sec    Stairs Yes    Stairs Assistance 6: Modified independent (Device/Increase time)    Stair Management Technique One rail Right    Number of Stairs 3                        Objective measurements completed on examination: See above findings.       [facility] Adult PT Treatment/Exercise - 11/03/21 1016       Exercises   Exercises Knee/Hip      Knee/Hip Exercises: Seated   Long Arc Quad AROM;Strengthening;Right;10 reps    Heel Slides AROM;Right;Strengthening;10 reps      Knee/Hip Exercises: Supine   Quad Sets AROM;Strengthening;Right;10 reps    Heel Slides AROM;Strengthening;Right;10 reps    Straight Leg Raises AROM;Strengthening;10 reps      Modalities   Modalities Vasopneumatic      Vasopneumatic   Number Minutes Vasopneumatic  10 minutes    Vasopnuematic Location  Knee    Vasopneumatic Pressure Medium    Vasopneumatic Temperature  34  PT Education - 11/03/21 1307     Education Details HEP    Person(s) Educated Patient    Methods Explanation;Demonstration;Handout    Comprehension Verbalized  understanding;Returned demonstration                 PT Long Term Goals - 11/03/21 1053       PT LONG TERM GOAL #1   Title The patient will be indep with HEP.    Time 6    Period Weeks    Target Date 12/15/21      PT LONG TERM GOAL #2   Title The patient will improve gait speed up to > or equal to 2.6 ft/sec to demo return to full community ambulator classification of gait.    Time 6    Period Weeks    Target Date 12/15/21      PT LONG TERM GOAL #3   Title The patient will negotiate 4 steps without rails and reciprocal pattern indep.    Time 6    Period Weeks    Target Date 12/15/21      PT LONG TERM GOAL #4   Title The patient will improve R knee AROM to 3-115 degrees to demo functional motion for ADLs and IADLs.    Time 6    Period Weeks    Target Date 12/15/21      PT LONG TERM GOAL #5   Title The patient will ambulate outdoor community surfaces without a device indep.    Time 6    Period Weeks    Target Date 12/15/21                    Plan - 11/03/21 1059     Clinical Impression Statement The patient is a 71 year old female s/p R TKR on 10/18/21.  She presents with impairments in knee range of motion, LE strength, gait speed, pain, and edema.  PT to address limitations to promote improved ADL, IADL performance.    Personal Factors and Comorbidities Comorbidity 1    Comorbidities HTN    Examination-Activity Limitations Locomotion Level;Squat;Stairs;Stand    Examination-Participation Restrictions Community Activity    Stability/Clinical Decision Making Stable/Uncomplicated    Clinical Decision Making Low    Rehab Potential Good    PT Frequency 2x / week    PT Duration 6 weeks    PT Treatment/Interventions ADLs/Self Care Home Management;Patient/family education;Manual techniques;Neuromuscular re-education;Moist Heat;Electrical Stimulation;Cryotherapy;Gait training;Stair training;Functional mobility training;Passive range of motion;Vasopneumatic  Device;Dry needling;Scar mobilization;Therapeutic activities;Therapeutic exercise    PT Next Visit Plan check HEP and progress, gait training reducing dependence on device, stair training for home entry, and vaso for edema control *focus on extension    PT Home Exercise Plan [email or web address]    Consulted and Agree with Plan of Care Patient             Patient will benefit from skilled therapeutic intervention in order to improve the following deficits and impairments:  Abnormal gait, Decreased range of motion, Increased fascial restricitons, Decreased strength, Impaired flexibility  Visit Diagnosis: Acute pain of right knee  Muscle weakness (generalized)  Other abnormalities of gait and mobility  Localized edema     Problem List Patient Active Problem List   Diagnosis Date Noted   Status post total right knee replacement 10/18/2021   Primary osteoarthritis of right knee 10/17/2021   Spondylosis without myelopathy or radiculopathy, lumbar region 09/28/2021   Chronic right shoulder pain 08/18/2021   Adhesive capsulitis of  right shoulder 08/18/2021   Gastroesophageal reflux disease 08/18/2021   Primary osteoarthritis of left knee 07/27/2021   Primary osteoarthritis involving multiple joints 07/13/2021   Strain of right trapezius muscle 07/13/2021   Essential hypertension 05/19/2021   Hypertriglyceridemia 05/19/2021   Osteoporosis 05/19/2021   Lumbar radiculopathy 05/19/2021   Osteoarthritis 05/19/2021   Depression, major, single episode, in partial remission ([place]) 05/19/2021   Chronic bilateral low back pain 05/19/2021   Status post left foot surgery 04/23/2015   Injury of foot, left 04/08/2015   Sprain of ankle 04/08/2015   Deformity of metatarsal bone of left foot 04/08/2015   Metatarsalgia of left foot 02/10/2015   Tenosynovitis of foot 02/10/2015   Chronic pain of both knees 02/10/2015   Closed fracture of metatarsal bone 10/21/2014   Edema of left foot 10/21/2014    Closed fracture of metatarsal bone(s) 01/30/2013   Pain, foot 01/30/2013   Abnormal foot finding 01/30/2013    Shreyas Piatkowski, PT 11/03/2021, 2:54 PM  [facility] [place], [place], [address] Phone: [phone]   Fax:  [phone]  Name: Amber Mccall MRN: [id number] Date of Birth: 15-Feb-1951

## 2021-11-08 ENCOUNTER — Ambulatory Visit: Payer: Medicare Other | Admitting: Physical Therapy

## 2021-11-08 DIAGNOSIS — M1711 Unilateral primary osteoarthritis, right knee: Secondary | ICD-10-CM | POA: Diagnosis not present

## 2021-11-08 DIAGNOSIS — M6281 Muscle weakness (generalized): Secondary | ICD-10-CM | POA: Diagnosis not present

## 2021-11-08 DIAGNOSIS — R2689 Other abnormalities of gait and mobility: Secondary | ICD-10-CM

## 2021-11-08 DIAGNOSIS — R600 Localized edema: Secondary | ICD-10-CM | POA: Diagnosis not present

## 2021-11-08 DIAGNOSIS — Z96651 Presence of right artificial knee joint: Secondary | ICD-10-CM | POA: Diagnosis not present

## 2021-11-08 DIAGNOSIS — M25561 Pain in right knee: Secondary | ICD-10-CM

## 2021-11-08 NOTE — Therapy (Signed)
[place], [place], [address] Phone: [phone]   Fax:  [phone]  Physical Therapy Treatment  Patient Details  Name: Amber Mccall MRN: [id number] Date of Birth: 03-16-1951 Referring Provider (PT): Frankey Shown  Rationale for Evaluation and Treatment Rehabilitation   Encounter Date: 11/08/2021   PT End of Session - 11/08/21 1017     Visit Number 2    Number of Visits 12    Date for PT Re-Evaluation 12/15/21    Authorization Type UHC medicare    Progress Note Due on Visit 10    PT Start Time 1017    PT Stop Time 1100    PT Time Calculation (min) 43 min    Activity Tolerance Patient tolerated treatment well;Patient limited by pain    Behavior During Therapy [facility] for tasks assessed/performed             Past Medical History:  Diagnosis Date   Anginal pain ([place])    pt says she feels like this may be related to acid reflux   Closed fracture of metatarsal bone(s) 01/30/2013   Depression    Essential hypertension    GERD (gastroesophageal reflux disease)    Lumbar radiculopathy    Osteoarthritis    Osteoporosis     Past Surgical History:  Procedure Laterality Date   ABDOMINAL HYSTERECTOMY     APPENDECTOMY     CATARACT EXTRACTION Bilateral 2022   [place] Osteotomy w/ Bone Graft Left 04/16/2015   Left foot   IR RADIOLOGIST EVAL & MGMT  04/11/2017   OPEN REDUCTION INTERNAL FIXATION (ORIF) DISTAL PHALANX Left 10/23/2014   5th Metatarsal   TOTAL KNEE ARTHROPLASTY Right 10/18/2021   Procedure: RIGHT TOTAL KNEE ARTHROPLASTY;  Surgeon: Leandrew Koyanagi, MD;  Location: [place];  Service: Orthopedics;  Laterality: Right;    There were no vitals filed for this visit.   Subjective Assessment - 11/08/21 1019     Subjective Pt states R knee is a little better but still feeling painful. Pt reports she's been doing the exercises -- they were challenging.    Patient is accompained by: Interpreter   via  [employer]-- [name] [phone]   Pertinent History HTN    Patient Stated Goals No pain and be able to walk.    Currently in Pain? Yes    Pain Score 5     Pain Location Knee    Pain Onset 1 to 4 weeks ago                [facility] PT Assessment - 11/08/21 0001       Assessment   Medical Diagnosis R TKR    Referring Provider (PT) Erlinda Hong, [place]    Onset Date/Surgical Date 10/18/21                           [facility] Adult PT Treatment/Exercise - 11/08/21 0001       Ambulation/Gait   Ambulation/Gait Assistance 6: Modified independent (Device/Increase time)    Ambulation Distance (Feet) 360 Feet    Assistive device Rollator    Gait Pattern Decreased stride length;Decreased step length - right;Decreased weight shift to right   cues to maintain trunk extension   Ambulation Surface Level;Indoor    Stairs Assistance 6: Modified independent (Device/Increase time)    Stair Management Technique Two rails;Alternating pattern;Step to pattern   initiated with step to pattern with L and then R LE;  and then trial of alternating pattern. Cues to keep R LE from ER   Number of Stairs 30      Knee/Hip Exercises: Stretches   Active Hamstring Stretch Left;30 seconds;2 reps    Other Knee/Hip Stretches heel propped knee ext stretch x2 min      Knee/Hip Exercises: Aerobic   Nustep L3 x 5 min      Knee/Hip Exercises: Seated   Long Arc Quad AROM;Strengthening;Right;10 reps;2 sets      Knee/Hip Exercises: Supine   Quad Sets AROM;Strengthening;Right;10 reps;2 sets    Quad Sets Limitations ankle propped    Heel Slides AROM;Strengthening;Right;10 reps;2 sets    Heel Slides Limitations with strap    Bridges Strengthening;Both;10 reps;2 sets    Bridges Limitations beginner bridge 2 sec                          PT Long Term Goals - 11/03/21 1053       PT LONG TERM GOAL #1   Title The patient will be indep with HEP.    Time 6    Period Weeks    Target Date 12/15/21      PT  LONG TERM GOAL #2   Title The patient will improve gait speed up to > or equal to 2.6 ft/sec to demo return to full community ambulator classification of gait.    Time 6    Period Weeks    Target Date 12/15/21      PT LONG TERM GOAL #3   Title The patient will negotiate 4 steps without rails and reciprocal pattern indep.    Time 6    Period Weeks    Target Date 12/15/21      PT LONG TERM GOAL #4   Title The patient will improve R knee AROM to 3-115 degrees to demo functional motion for ADLs and IADLs.    Time 6    Period Weeks    Target Date 12/15/21      PT LONG TERM GOAL #5   Title The patient will ambulate outdoor community surfaces without a device indep.    Time 6    Period Weeks    Target Date 12/15/21                   Plan - 11/08/21 1022     Clinical Impression Statement Focused on improving knee ROM, strength and gait this session. Focused on improving knee extension. Demonstrated weak hip extensor strength with bridging.    Personal Factors and Comorbidities Comorbidity 1    Comorbidities HTN    Examination-Activity Limitations Locomotion Level;Squat;Stairs;Stand    Examination-Participation Restrictions Community Activity    Stability/Clinical Decision Making Stable/Uncomplicated    Rehab Potential Good    PT Frequency 2x / week    PT Duration 6 weeks    PT Treatment/Interventions ADLs/Self Care Home Management;Patient/family education;Manual techniques;Neuromuscular re-education;Moist Heat;Electrical Stimulation;Cryotherapy;Gait training;Stair training;Functional mobility training;Passive range of motion;Vasopneumatic Device;Dry needling;Scar mobilization;Therapeutic activities;Therapeutic exercise    PT Next Visit Plan check HEP and progress, gait training reducing dependence on device (trial cane), stair training for home entry, and vaso for edema control *focus on extension    PT Home Exercise Plan [id number]    Consulted and Agree with Plan of Care  Patient             Patient will benefit from skilled therapeutic intervention in order to improve the following deficits and impairments:  Abnormal  gait, Decreased range of motion, Increased fascial restricitons, Decreased strength, Impaired flexibility  Visit Diagnosis: Acute pain of right knee  Muscle weakness (generalized)  Other abnormalities of gait and mobility  Localized edema     Problem List Patient Active Problem List   Diagnosis Date Noted   Status post total right knee replacement 10/18/2021   Primary osteoarthritis of right knee 10/17/2021   Spondylosis without myelopathy or radiculopathy, lumbar region 09/28/2021   Chronic right shoulder pain 08/18/2021   Adhesive capsulitis of right shoulder 08/18/2021   Gastroesophageal reflux disease 08/18/2021   Primary osteoarthritis of left knee 07/27/2021   Primary osteoarthritis involving multiple joints 07/13/2021   Strain of right trapezius muscle 07/13/2021   Essential hypertension 05/19/2021   Hypertriglyceridemia 05/19/2021   Osteoporosis 05/19/2021   Lumbar radiculopathy 05/19/2021   Osteoarthritis 05/19/2021   Depression, major, single episode, in partial remission ([place]) 05/19/2021   Chronic bilateral low back pain 05/19/2021   Status post left foot surgery 04/23/2015   Injury of foot, left 04/08/2015   Sprain of ankle 04/08/2015   Deformity of metatarsal bone of left foot 04/08/2015   Metatarsalgia of left foot 02/10/2015   Tenosynovitis of foot 02/10/2015   Chronic pain of both knees 02/10/2015   Closed fracture of metatarsal bone 10/21/2014   Edema of left foot 10/21/2014   Closed fracture of metatarsal bone(s) 01/30/2013   Pain, foot 01/30/2013   Abnormal foot finding 01/30/2013    [facility] April Gordy Levan, PT, DPT 11/08/2021, 10:54 AM  [facility] [place] [address] [place], [place], [address] Phone: [phone]   Fax:   [phone]  Name: Amber Mccall MRN: [id number] Date of Birth: 1950/11/07

## 2021-11-10 ENCOUNTER — Encounter: Payer: Self-pay | Admitting: Rehabilitative and Restorative Service Providers"

## 2021-11-10 ENCOUNTER — Ambulatory Visit: Payer: Medicare Other | Admitting: Rehabilitative and Restorative Service Providers"

## 2021-11-10 DIAGNOSIS — M25561 Pain in right knee: Secondary | ICD-10-CM

## 2021-11-10 DIAGNOSIS — R600 Localized edema: Secondary | ICD-10-CM

## 2021-11-10 DIAGNOSIS — M1711 Unilateral primary osteoarthritis, right knee: Secondary | ICD-10-CM | POA: Diagnosis not present

## 2021-11-10 DIAGNOSIS — M6281 Muscle weakness (generalized): Secondary | ICD-10-CM

## 2021-11-10 DIAGNOSIS — Z96651 Presence of right artificial knee joint: Secondary | ICD-10-CM | POA: Diagnosis not present

## 2021-11-10 DIAGNOSIS — R2689 Other abnormalities of gait and mobility: Secondary | ICD-10-CM

## 2021-11-10 NOTE — Therapy (Addendum)
OUTPATIENT PHYSICAL THERAPY TREATMENT NOTE   Patient Name: Amber Mccall MRN: [id number] DOB:[birth year], 71 y.o., female Today's Date: 11/10/2021  PCP: Amber Sheer, MD REFERRING PROVIDER: Frankey Shown, MD   PT End of Session - 11/10/21 1030     Visit Number 3    Number of Visits 12    Date for PT Re-Evaluation 12/15/21    Authorization Type U[employer] medicare    Progress Note Due on Visit 10    PT Start Time 1017    PT Stop Time 1108    PT Time Calculation (min) 51 min    Activity Tolerance Patient tolerated treatment well;Patient limited by pain    Behavior During Therapy [facility] for tasks assessed/performed             Past Medical History:  Diagnosis Date   Anginal pain ([place])    pt says she feels like this may be related to acid reflux   Closed fracture of metatarsal bone(s) 01/30/2013   Depression    Essential hypertension    GERD (gastroesophageal reflux disease)    Lumbar radiculopathy    Osteoarthritis    Osteoporosis    Past Surgical History:  Procedure Laterality Date   ABDOMINAL HYSTERECTOMY     APPENDECTOMY     CATARACT EXTRACTION Bilateral 2022   Cotton Osteotomy w/ Bone Graft Left 04/16/2015   Left foot   IR RADIOLOGIST EVAL & MGMT  04/11/2017   OPEN REDUCTION INTERNAL FIXATION (ORIF) DISTAL PHALANX Left 10/23/2014   5th Metatarsal   TOTAL KNEE ARTHROPLASTY Right 10/18/2021   Procedure: RIGHT TOTAL KNEE ARTHROPLASTY;  Surgeon: Amber Koyanagi, MD;  Location: [place];  Service: Orthopedics;  Laterality: Right;   Patient Active Problem List   Diagnosis Date Noted   Status post total right knee replacement 10/18/2021   Primary osteoarthritis of right knee 10/17/2021   Spondylosis without myelopathy or radiculopathy, lumbar region 09/28/2021   Chronic right shoulder pain 08/18/2021   Adhesive capsulitis of right shoulder 08/18/2021   Gastroesophageal reflux disease 08/18/2021   Primary osteoarthritis of left knee 07/27/2021   Primary osteoarthritis  involving multiple joints 07/13/2021   Strain of right trapezius muscle 07/13/2021   Essential hypertension 05/19/2021   Hypertriglyceridemia 05/19/2021   Osteoporosis 05/19/2021   Lumbar radiculopathy 05/19/2021   Osteoarthritis 05/19/2021   Depression, major, single episode, in partial remission ([place]) 05/19/2021   Chronic bilateral low back pain 05/19/2021   Status post left foot surgery 04/23/2015   Injury of foot, left 04/08/2015   Sprain of ankle 04/08/2015   Deformity of metatarsal bone of left foot 04/08/2015   Metatarsalgia of left foot 02/10/2015   Tenosynovitis of foot 02/10/2015   Chronic pain of both knees 02/10/2015   Closed fracture of metatarsal bone 10/21/2014   Edema of left foot 10/21/2014   Closed fracture of metatarsal bone(s) 01/30/2013   Pain, foot 01/30/2013   Abnormal foot finding 01/30/2013    REFERRING DIAG: R TKR  THERAPY DIAG:  Acute pain of right knee  Muscle weakness (generalized)  Other abnormalities of gait and mobility  Localized edema  Rationale for Evaluation and Treatment Rehabilitation  PERTINENT HISTORY: HTN   PRECAUTIONS: None  SUBJECTIVE: The patient is using cane in home and walks in today with cane.   PAIN:  Are you having pain? Yes: NPRS scale: 5/10 Pain location: R TKR Pain description: sore Aggravating factors: movement Relieving factors: rest     TODAY'S TREATMENT:  11/10/21: Activity  Therex  Nu step Level 5 5 minutes  Heel slides supine X 15 reps Cues for extension  SAQ supine X 15 reps Cues for extension  Quad sets X 15 reps Towel and tactile cues  Standing heel raises X 10 reps With UE support  Standing knee flexion X 5 reps With UE support  Gait:  With SPC X 200 feet Cues for correct sequencing, using in L UE; cues for upright posture and cues for R heel strike  Backwards walking 20 feet  X 4 reps  Self care:   Discussed safety in home with SPC versus RW Recommended use of cane in kitchen near  countertops to practice for safety  Vaso:  R knee  X 10 minutes 34 degrees  Short arc quad-12 degrees from full extension     PATIENT EDUCATION: Education details: Safety for home/ see above Person educated: Patient Education method: Consulting civil engineer, Media planner, Verbal cues, and Handouts Education comprehension: verbalized understanding and returned demonstration   HOME EXERCISE PROGRAM: Access Code: [email or web address] URL: [email or web address] Date: 11/03/2021 Prepared by: Amber Mccall   Exercises - Supine Heel Slides  - 1 x daily - 7 x weekly - 2 sets - 10 reps - Supine Quadricep Sets  - 1 x daily - 7 x weekly - 2 sets - 10 reps - Supine Active Straight Leg Raise  - 1 x daily - 7 x weekly - 2 sets - 10 reps - Seated Long Arc Quad  - 1 x daily - 7 x weekly - 2 sets - 10 reps - Seated Knee Flexion Stretch  - 1 x daily - 7 x weekly - 2 sets - 3 reps - 20 seconds hodld    PT Long Term Goals -       PT LONG TERM GOAL #1   Title The patient will be indep with HEP.    Time 6    Period Weeks    Target Date 12/15/21      PT LONG TERM GOAL #2   Title The patient will improve gait speed up to > or equal to 2.6 ft/sec to demo return to full community ambulator classification of gait.    Time 6    Period Weeks    Target Date 12/15/21      PT LONG TERM GOAL #3   Title The patient will negotiate 4 steps without rails and reciprocal pattern indep.    Time 6    Period Weeks    Target Date 12/15/21      PT LONG TERM GOAL #4   Title The patient will improve R knee AROM to 3-115 degrees to demo functional motion for ADLs and IADLs.    Time 6    Period Weeks    Target Date 12/15/21      PT LONG TERM GOAL #5   Title The patient will ambulate outdoor community surfaces without a device indep.    Time 6    Period Weeks    Target Date 12/15/21              Plan - 11/10/21 1104     Clinical Impression Statement The patient arrived today with [name] noting she was  not feeling completely safe.  PT recommended she use the device she feels safest with and we will work on gaining comfort within therapy session.  Patient progressing with mobility.    Personal Factors and Comorbidities Comorbidity 1    Comorbidities HTN    Examination-Activity Limitations Locomotion Level;Squat;Stairs;Stand  Examination-Participation Restrictions Community Activity    Stability/Clinical Decision Making Stable/Uncomplicated    Rehab Potential Good    PT Frequency 2x / week    PT Duration 6 weeks    PT Treatment/Interventions ADLs/Self Care Home Management;Patient/family education;Manual techniques;Neuromuscular re-education;Moist Heat;Electrical Stimulation;Cryotherapy;Gait training;Stair training;Functional mobility training;Passive range of motion;Vasopneumatic Device;Dry needling;Scar mobilization;Therapeutic activities;Therapeutic exercise    PT Next Visit Plan Progress HEP, progress gait with SPC, work on heel strike during gait, work on [place] and Agree with Plan of Care Patient               [place], [place] 11/10/2021, 2:27 PM

## 2021-11-15 ENCOUNTER — Encounter: Payer: Self-pay | Admitting: Physical Therapy

## 2021-11-15 ENCOUNTER — Ambulatory Visit: Payer: Medicare Other | Admitting: Physical Therapy

## 2021-11-15 DIAGNOSIS — R600 Localized edema: Secondary | ICD-10-CM

## 2021-11-15 DIAGNOSIS — M25561 Pain in right knee: Secondary | ICD-10-CM

## 2021-11-15 DIAGNOSIS — M6281 Muscle weakness (generalized): Secondary | ICD-10-CM | POA: Diagnosis not present

## 2021-11-15 DIAGNOSIS — M1711 Unilateral primary osteoarthritis, right knee: Secondary | ICD-10-CM | POA: Diagnosis not present

## 2021-11-15 DIAGNOSIS — R2689 Other abnormalities of gait and mobility: Secondary | ICD-10-CM

## 2021-11-15 DIAGNOSIS — Z96651 Presence of right artificial knee joint: Secondary | ICD-10-CM | POA: Diagnosis not present

## 2021-11-15 NOTE — Therapy (Signed)
OUTPATIENT PHYSICAL THERAPY TREATMENT NOTE   Patient Name: Jillene Wehrenberg MRN: [id number] DOB:03-16-1951, 71 y.o., female Today's Date: 11/15/2021  PCP: Riki Sheer, MD REFERRING PROVIDER: Frankey Shown, MD   PT End of Session - 11/15/21 1024     Visit Number 4    Number of Visits 12    Date for PT Re-Evaluation 12/15/21    Authorization Type UHC medicare    Progress Note Due on Visit 10    PT Start Time 1020    PT Stop Time 1109    PT Time Calculation (min) 49 min    Activity Tolerance Patient tolerated treatment well;Patient limited by pain    Behavior During Therapy [facility] for tasks assessed/performed              Past Medical History:  Diagnosis Date   Anginal pain ([place])    pt says she feels like this may be related to acid reflux   Closed fracture of metatarsal bone(s) 01/30/2013   Depression    Essential hypertension    GERD (gastroesophageal reflux disease)    Lumbar radiculopathy    Osteoarthritis    Osteoporosis    Past Surgical History:  Procedure Laterality Date   ABDOMINAL HYSTERECTOMY     APPENDECTOMY     CATARACT EXTRACTION Bilateral 2022   Cotton Osteotomy w/ Bone Graft Left 04/16/2015   Left foot   IR RADIOLOGIST EVAL & MGMT  04/11/2017   OPEN REDUCTION INTERNAL FIXATION (ORIF) DISTAL PHALANX Left 10/23/2014   5th Metatarsal   TOTAL KNEE ARTHROPLASTY Right 10/18/2021   Procedure: RIGHT TOTAL KNEE ARTHROPLASTY;  Surgeon: Leandrew Koyanagi, MD;  Location: [place];  Service: Orthopedics;  Laterality: Right;   Patient Active Problem List   Diagnosis Date Noted   Status post total right knee replacement 10/18/2021   Primary osteoarthritis of right knee 10/17/2021   Spondylosis without myelopathy or radiculopathy, lumbar region 09/28/2021   Chronic right shoulder pain 08/18/2021   Adhesive capsulitis of right shoulder 08/18/2021   Gastroesophageal reflux disease 08/18/2021   Primary osteoarthritis of left knee 07/27/2021   Primary osteoarthritis  involving multiple joints 07/13/2021   Strain of right trapezius muscle 07/13/2021   Essential hypertension 05/19/2021   Hypertriglyceridemia 05/19/2021   Osteoporosis 05/19/2021   Lumbar radiculopathy 05/19/2021   Osteoarthritis 05/19/2021   Depression, major, single episode, in partial remission ([place]) 05/19/2021   Chronic bilateral low back pain 05/19/2021   Status post left foot surgery 04/23/2015   Injury of foot, left 04/08/2015   Sprain of ankle 04/08/2015   Deformity of metatarsal bone of left foot 04/08/2015   Metatarsalgia of left foot 02/10/2015   Tenosynovitis of foot 02/10/2015   Chronic pain of both knees 02/10/2015   Closed fracture of metatarsal bone 10/21/2014   Edema of left foot 10/21/2014   Closed fracture of metatarsal bone(s) 01/30/2013   Pain, foot 01/30/2013   Abnormal foot finding 01/30/2013    REFERRING DIAG: R TKR  THERAPY DIAG:  Acute pain of right knee  Muscle weakness (generalized)  Other abnormalities of gait and mobility  Localized edema  Rationale for Evaluation and Treatment Rehabilitation  PERTINENT HISTORY: HTN   PRECAUTIONS: None  SUBJECTIVE: Pt states she has been furniture walking in the house -- has not been using the cane. Will be following up with ortho this week to get sutures taken out.   PAIN:  Are you having pain? Yes: NPRS scale: 4/10 Pain location: R TKR Pain description: sore  Aggravating factors: movement Relieving factors: rest     TODAY'S TREATMENT:  11/15/21: Activity  Therex  Bike Level 1 5 minutes  Quad sets sup 2X 10 reps Cues for extension  SLR sup 2X 10 reps Cues for extension  Heel slide sup 2X 10 reps   Bridging sup 2X 10 reps   Standing wall slides X 10 reps Cues to weightshift to R  Terminal knee ext against ball 2X 10 reps   Standing heel raise 2X 10 reps   Standing hip abd 2X 10 reps Yellow tband   Vaso:  R knee  X 10 minutes 34 degrees   Manual therapy:  Joint mobilization  Grade II to  III for knee ext and flex  PROM  Knee flex/ext    11/10/21: Activity  Therex  Nu step Level 5 5 minutes  Heel slides supine X 15 reps Cues for extension  SAQ supine X 15 reps Cues for extension  Quad sets X 15 reps Towel and tactile cues  Standing heel raises X 10 reps With UE support  Standing knee flexion X 5 reps With UE support  Gait:  With SPC X 200 feet Cues for correct sequencing, using in L UE; cues for upright posture and cues for R heel strike  Backwards walking 20 feet  X 4 reps  Self care:   Discussed safety in home with SPC versus RW Recommended use of cane in kitchen near countertops to practice for safety  Vaso:  R knee  X 10 minutes 34 degrees  Short arc quad-12 degrees from full extension     PATIENT EDUCATION: Education details: HEP updates/modifications Person educated: Patient Education method: Consulting civil engineer, Demonstration, Verbal cues, and Handouts Education comprehension: verbalized understanding and returned demonstration   HOME EXERCISE PROGRAM: Access Code: [email or web address] URL: [email or web address] Date: 11/15/2021 Prepared by: Estill Bamberg April Thurnell Garbe  Exercises - Supine Heel Slides  - 1 x daily - 7 x weekly - 2 sets - 10 reps - Supine Quadricep Sets  - 1 x daily - 7 x weekly - 2 sets - 10 reps - Supine Active Straight Leg Raise  - 1 x daily - 7 x weekly - 2 sets - 10 reps - Seated Long Arc Quad  - 1 x daily - 7 x weekly - 2 sets - 10 reps - Seated Knee Flexion Stretch  - 1 x daily - 7 x weekly - 2 sets - 3 reps - 20 seconds hold - Supine Bridge  - 1 x daily - 7 x weekly - 2 sets - 10 reps - Wall Squat  - 1 x daily - 7 x weekly - 1 sets - 10 reps - Hip Abduction with Resistance Loop  - 1 x daily - 7 x weekly - 2 sets - 10 reps    PT Long Term Goals -       PT LONG TERM GOAL #1   Title The patient will be indep with HEP.    Time 6    Period Weeks    Target Date 12/15/21      PT LONG TERM GOAL #2   Title The patient will improve  gait speed up to > or equal to 2.6 ft/sec to demo return to full community ambulator classification of gait.    Time 6    Period Weeks    Target Date 12/15/21      PT LONG TERM GOAL #3   Title The patient will negotiate 4 steps without  rails and reciprocal pattern indep.    Time 6    Period Weeks    Target Date 12/15/21      PT LONG TERM GOAL #4   Title The patient will improve R knee AROM to 3-115 degrees to demo functional motion for ADLs and IADLs.    Time 6    Period Weeks    Target Date 12/15/21      PT LONG TERM GOAL #5   Title The patient will ambulate outdoor community surfaces without a device indep.    Time 6    Period Weeks    Target Date 12/15/21              Plan -    Clinical Impression Statement Session focused on continuing to progress exercises and ROM. Pt demos great flexion. Knee ext lacks ~5 to 10 deg. Has been trying to amb more independently without a/d at home using counters for support. Initiated more standing exercises for HEP.    Personal Factors and Comorbidities Comorbidity 1    Comorbidities HTN    Examination-Activity Limitations Locomotion Level;Squat;Stairs;Stand    Examination-Participation Restrictions Community Activity    Stability/Clinical Decision Making Stable/Uncomplicated    Rehab Potential Good    PT Frequency 2x / week    PT Duration 6 weeks    PT Treatment/Interventions ADLs/Self Care Home Management;Patient/family education;Manual techniques;Neuromuscular re-education;Moist Heat;Electrical Stimulation;Cryotherapy;Gait training;Stair training;Functional mobility training;Passive range of motion;Vasopneumatic Device;Dry needling;Scar mobilization;Therapeutic activities;Therapeutic exercise    PT Next Visit Plan Progress HEP, progress gait with SPC, work on heel strike during gait, work on [place] and Agree with Plan of Care Patient               Bricelyn [place]estone April Gordy Levan,  PT, DPT 11/15/2021, 11:03 AM

## 2021-11-16 ENCOUNTER — Ambulatory Visit: Payer: Medicare Other | Admitting: Family Medicine

## 2021-11-17 ENCOUNTER — Ambulatory Visit: Payer: Medicare Other | Admitting: Physical Therapy

## 2021-11-17 ENCOUNTER — Encounter: Payer: Self-pay | Admitting: Physical Therapy

## 2021-11-17 DIAGNOSIS — R600 Localized edema: Secondary | ICD-10-CM

## 2021-11-17 DIAGNOSIS — M6281 Muscle weakness (generalized): Secondary | ICD-10-CM | POA: Diagnosis not present

## 2021-11-17 DIAGNOSIS — Z96651 Presence of right artificial knee joint: Secondary | ICD-10-CM | POA: Diagnosis not present

## 2021-11-17 DIAGNOSIS — M25561 Pain in right knee: Secondary | ICD-10-CM | POA: Diagnosis not present

## 2021-11-17 DIAGNOSIS — R2689 Other abnormalities of gait and mobility: Secondary | ICD-10-CM

## 2021-11-17 DIAGNOSIS — M1711 Unilateral primary osteoarthritis, right knee: Secondary | ICD-10-CM | POA: Diagnosis not present

## 2021-11-17 NOTE — Therapy (Signed)
OUTPATIENT PHYSICAL THERAPY TREATMENT NOTE   Patient Name: Amber Mccall MRN: [id number] DOB:1950-07-15, 71 y.o., female Today's Date: 11/17/2021  PCP: Riki Sheer, MD REFERRING PROVIDER: Frankey Shown, MD   PT End of Session - 11/17/21 1015     Visit Number 5    Number of Visits 12    Date for PT Re-Evaluation 12/15/21    Authorization Type [employer] medicare    Progress Note Due on Visit 10    PT Start Time 1015    PT Stop Time 1100    PT Time Calculation (min) 45 min    Activity Tolerance Patient tolerated treatment well;Patient limited by pain    Behavior During Therapy [facility] for tasks assessed/performed               Past Medical History:  Diagnosis Date   Anginal pain ([place])    pt says she feels like this may be related to acid reflux   Closed fracture of metatarsal bone(s) 01/30/2013   Depression    Essential hypertension    GERD (gastroesophageal reflux disease)    Lumbar radiculopathy    Osteoarthritis    Osteoporosis    Past Surgical History:  Procedure Laterality Date   ABDOMINAL HYSTERECTOMY     APPENDECTOMY     CATARACT EXTRACTION Bilateral 2022   Cotton Osteotomy w/ Bone Graft Left 04/16/2015   Left foot   IR RADIOLOGIST EVAL & MGMT  04/11/2017   OPEN REDUCTION INTERNAL FIXATION (ORIF) DISTAL PHALANX Left 10/23/2014   5th Metatarsal   TOTAL KNEE ARTHROPLASTY Right 10/18/2021   Procedure: RIGHT TOTAL KNEE ARTHROPLASTY;  Surgeon: Leandrew Koyanagi, MD;  Location: [place];  Service: Orthopedics;  Laterality: Right;   Patient Active Problem List   Diagnosis Date Noted   Status post total right knee replacement 10/18/2021   Primary osteoarthritis of right knee 10/17/2021   Spondylosis without myelopathy or radiculopathy, lumbar region 09/28/2021   Chronic right shoulder pain 08/18/2021   Adhesive capsulitis of right shoulder 08/18/2021   Gastroesophageal reflux disease 08/18/2021   Primary osteoarthritis of left knee 07/27/2021   Primary osteoarthritis  involving multiple joints 07/13/2021   Strain of right trapezius muscle 07/13/2021   Essential hypertension 05/19/2021   Hypertriglyceridemia 05/19/2021   Osteoporosis 05/19/2021   Lumbar radiculopathy 05/19/2021   Osteoarthritis 05/19/2021   Depression, major, single episode, in partial remission ([place]) 05/19/2021   Chronic bilateral low back pain 05/19/2021   Status post left foot surgery 04/23/2015   Injury of foot, left 04/08/2015   Sprain of ankle 04/08/2015   Deformity of metatarsal bone of left foot 04/08/2015   Metatarsalgia of left foot 02/10/2015   Tenosynovitis of foot 02/10/2015   Chronic pain of both knees 02/10/2015   Closed fracture of metatarsal bone 10/21/2014   Edema of left foot 10/21/2014   Closed fracture of metatarsal bone(s) 01/30/2013   Pain, foot 01/30/2013   Abnormal foot finding 01/30/2013    REFERRING DIAG: R TKR  THERAPY DIAG:  Acute pain of right knee  Muscle weakness (generalized)  Other abnormalities of gait and mobility  Localized edema  Rationale for Evaluation and Treatment Rehabilitation  PERTINENT HISTORY: HTN   PRECAUTIONS: None  SUBJECTIVE: Pt reports continued R knee pain. Pt states that she was sore after last session but it wasn't too bad.   PAIN:  Are you having pain? Yes: NPRS scale: 4/10 Pain location: R TKR Pain description: sore Aggravating factors: movement Relieving factors: rest  TODAY'S TREATMENT:  11/17/21: Activity  Therex  Bike Level 1 5 minutes warm up  Prone knee flexion stretch 2x30 sec   Prone hamstring curl 2x10 1.5#  Prone hip extension 2x10   Prone Quad sets  2X 10 reps Cues for extension  Supine hip flexor stretch 2x30 sec   SLR sup 2X 10 reps Cues for extension  Supine knee ext stretch with manual assist 2x30 sec   Hamstring stretch sup 2x30 sec   SLS 3x20 sec   Standing wall slides 2X 10 reps Cues to weightshift to R  Vaso:  R knee  X 10 minutes 34 degrees   Gait  Backwards walking  4x15' By counter  Side stepping 4x15' 4th set with yellow tband  Tandem walking 4x15'    Manual therapy:  Joint mobilization  Grade II to III for knee ext and flex  PROM  Knee ext    11/15/21: Activity  Therex  Bike  Level 1 5 minutes  Quad sets supine 2X 10 reps 2# Cues for extension  SLR supine 2X 10 reps 2# Cues for extension  Heel slide supine 2X 10 reps   Bridging supine 2X 10 reps   Standing wall slides X 10 reps Cues to weightshift to R  Terminal knee ext against ball 2X 10 reps   Standing heel raise 2X 10 reps   Standing hip abd 2X 10 reps Yellow tband   Vaso:  R knee  X 10 minutes 34 degrees   Manual therapy:  Joint mobilization  Grade II to III for knee ext and flex  PROM  Knee flex/ext         PATIENT EDUCATION: Education details: HEP updates/modifications Person educated: Patient Education method: Consulting civil engineer, Demonstration, Verbal cues, and Handouts Education comprehension: verbalized understanding and returned demonstration   HOME EXERCISE PROGRAM: Access Code: [email or web address] URL: [email or web address] Date: 11/17/2021 Prepared by: Estill Bamberg April Thurnell Garbe  Exercises - Supine Heel Slides  - 1 x daily - 7 x weekly - 2 sets - 10 reps - Supine Quadricep Sets  - 1 x daily - 7 x weekly - 2 sets - 10 reps - Supine Active Straight Leg Raise  - 1 x daily - 7 x weekly - 2 sets - 10 reps - Seated Long Arc Quad  - 1 x daily - 7 x weekly - 2 sets - 10 reps - Seated Knee Flexion Stretch  - 1 x daily - 7 x weekly - 2 sets - 3 reps - 20 seconds hold - Supine Bridge  - 1 x daily - 7 x weekly - 2 sets - 10 reps - Wall Squat  - 1 x daily - 7 x weekly - 1 sets - 10 reps - Hip Abduction with Resistance Loop  - 1 x daily - 7 x weekly - 2 sets - 10 reps - Single Leg Stance  - 1 x daily - 7 x weekly - 2 sets - 20 sec hold    PT Long Term Goals -       PT LONG TERM GOAL #1   Title The patient will be indep with HEP.    Time 6    Period Weeks    Target  Date 12/15/21      PT LONG TERM GOAL #2   Title The patient will improve gait speed up to > or equal to 2.6 ft/sec to demo return to full community ambulator classification of gait.    Time 6  Period Weeks    Target Date 12/15/21      PT LONG TERM GOAL #3   Title The patient will negotiate 4 steps without rails and reciprocal pattern indep.    Time 6    Period Weeks    Target Date 12/15/21      PT LONG TERM GOAL #4   Title The patient will improve R knee AROM to 3-115 degrees to demo functional motion for ADLs and IADLs.    Time 6    Period Weeks    Target Date 12/15/21      PT LONG TERM GOAL #5   Title The patient will ambulate outdoor community surfaces without a device indep.    Time 6    Period Weeks    Target Date 12/15/21              Plan -    Clinical Impression Statement Session continues to focus on improving knee ROM, strength, and stability. Wall slides remain challenging. Worked on dynamic gait and balance with pt demonstrating good stability. Tight R hip flexors noted -- worked on stretching to improve glute activation/hip extension.   Personal Factors and Comorbidities Comorbidity 1    Comorbidities HTN    Examination-Activity Limitations Locomotion Level;Squat;Stairs;Stand    Examination-Participation Restrictions Community Activity    Stability/Clinical Decision Making Stable/Uncomplicated    Rehab Potential Good    PT Frequency 2x / week    PT Duration 6 weeks    PT Treatment/Interventions ADLs/Self Care Home Management;Patient/family education;Manual techniques;Neuromuscular re-education;Moist Heat;Electrical Stimulation;Cryotherapy;Gait training;Stair training;Functional mobility training;Passive range of motion;Vasopneumatic Device;Dry needling;Scar mobilization;Therapeutic activities;Therapeutic exercise    PT Next Visit Plan Progress HEP, progress gait with SPC, work on heel strike during gait, work on [place] and Agree with Plan of Care Patient               Makoa Satz April Gordy Levan, PT, DPT 11/17/2021, 11:07 AM

## 2021-11-18 ENCOUNTER — Encounter: Payer: Medicare Other | Admitting: Physical Medicine & Rehabilitation

## 2021-11-22 ENCOUNTER — Ambulatory Visit: Payer: Medicare Other | Admitting: Physical Therapy

## 2021-11-22 NOTE — Therapy (Signed)
OUTPATIENT PHYSICAL THERAPY TREATMENT NOTE   Patient Name: Amber Mccall MRN: [id number] DOB:[birth year], 71 y.o., female Today's Date: 11/23/2021  PCP: Riki Sheer, MD REFERRING PROVIDER: Frankey Shown, MD   PT End of Session - 11/23/21 [phone]     Visit Number 6    Number of Visits 12    Date for PT Re-Evaluation 12/15/21    Authorization Type UHC medicare    Progress Note Due on Visit 10    PT Start Time [phone]    PT Stop Time 0930    PT Time Calculation (min) 53 min    Activity Tolerance Patient tolerated treatment well    Behavior During Therapy [facility] for tasks assessed/performed                Past Medical History:  Diagnosis Date   Anginal pain ([place])    pt says she feels like this may be related to acid reflux   Closed fracture of metatarsal bone(s) 01/30/2013   Depression    Essential hypertension    GERD (gastroesophageal reflux disease)    Lumbar radiculopathy    Osteoarthritis    Osteoporosis    Past Surgical History:  Procedure Laterality Date   ABDOMINAL HYSTERECTOMY     APPENDECTOMY     CATARACT EXTRACTION Bilateral 2022   Cotton Osteotomy w/ Bone Graft Left 04/16/2015   Left foot   IR RADIOLOGIST EVAL & MGMT  04/11/2017   OPEN REDUCTION INTERNAL FIXATION (ORIF) DISTAL PHALANX Left 10/23/2014   5th Metatarsal   TOTAL KNEE ARTHROPLASTY Right 10/18/2021   Procedure: RIGHT TOTAL KNEE ARTHROPLASTY;  Surgeon: Leandrew Koyanagi, MD;  Location: [place];  Service: Orthopedics;  Laterality: Right;   Patient Active Problem List   Diagnosis Date Noted   Status post total right knee replacement 10/18/2021   Primary osteoarthritis of right knee 10/17/2021   Spondylosis without myelopathy or radiculopathy, lumbar region 09/28/2021   Chronic right shoulder pain 08/18/2021   Adhesive capsulitis of right shoulder 08/18/2021   Gastroesophageal reflux disease 08/18/2021   Primary osteoarthritis of left knee 07/27/2021   Primary osteoarthritis involving multiple joints  07/13/2021   Strain of right trapezius muscle 07/13/2021   Essential hypertension 05/19/2021   Hypertriglyceridemia 05/19/2021   Osteoporosis 05/19/2021   Lumbar radiculopathy 05/19/2021   Osteoarthritis 05/19/2021   Depression, major, single episode, in partial remission ([place]) 05/19/2021   Chronic bilateral low back pain 05/19/2021   Status post left foot surgery 04/23/2015   Injury of foot, left 04/08/2015   Sprain of ankle 04/08/2015   Deformity of metatarsal bone of left foot 04/08/2015   Metatarsalgia of left foot 02/10/2015   Tenosynovitis of foot 02/10/2015   Chronic pain of both knees 02/10/2015   Closed fracture of metatarsal bone 10/21/2014   Edema of left foot 10/21/2014   Closed fracture of metatarsal bone(s) 01/30/2013   Pain, foot 01/30/2013   Abnormal foot finding 01/30/2013    REFERRING DIAG: R TKR  THERAPY DIAG:  Acute pain of right knee  Muscle weakness (generalized)  Other abnormalities of gait and mobility  Localized edema  Rationale for Evaluation and Treatment Rehabilitation  PERTINENT HISTORY: HTN   PRECAUTIONS: None  SUBJECTIVE: Patient reports still having pain and bruising since surgery.   PAIN:  Are you having pain? Yes: NPRS scale: 3-4/10 Pain location: R TKR Pain description: sore Aggravating factors: movement Relieving factors: rest  OBJECTIVE: (objective measurements taken at eval unless otherwise dated)    AROM  11/23/21  Overall AROM  Deficits      AROM Assessment Site Knee      Right/Left Knee Right      Right Knee Extension -20   knee extension lag -7    Right Knee Flexion 96  117          PROM     Overall PROM  Deficits      PROM Assessment Site Knee      Right/Left Knee Right      Right Knee Extension -12  -4    Right Knee Flexion 105  125          Strength     Overall Strength Deficits      Overall Strength Comments can lift through partial ROM against gravity            TODAY'S TREATMENT:  11/23/21: Bike L  2 x 5 min Prone knee flex stretch 2x30 sec Prone knee ext stretch x 1 min Prone HS curl 2#  2x10 Prone quad sets 5 sec hold 2x10 Supine HS stretch with strap 2x30 sec SLR 2x10 cues to DF ankle  Gait: Worked on step and stride length as well as heel strike Backwards Walking 4 x 15' Sidestepping YTB 4x15' Tandem walking 4 x 15' Static tandem balance x 2 reps each way  Manual: Passive knee ext stretch x 30 sec   Modalities: Vaso  R knee x 10 min 34 deg med pressure   11/17/21: Activity  Therex  Bike Level 1 5 minutes warm up  Prone knee flexion stretch 2x30 sec   Prone hamstring curl 2x10 1.5#  Prone hip extension 2x10   Prone Quad sets  2X 10 reps Cues for extension  Supine hip flexor stretch 2x30 sec   SLR sup 2X 10 reps Cues for extension  Supine knee ext stretch with manual assist 2x30 sec   Hamstring stretch sup 2x30 sec   SLS 3x20 sec   Standing wall slides 2X 10 reps Cues to weightshift to R  Vaso:  R knee  X 10 minutes 34 degrees   Gait  Backwards walking 4x15' By counter  Side stepping 4x15' 4th set with yellow tband  Tandem walking 4x15'    Manual therapy:  Joint mobilization  Grade II to III for knee ext and flex  PROM  Knee ext    11/15/21: Activity  Therex  Bike  Level 1 5 minutes  Quad sets supine 2X 10 reps 2# Cues for extension  SLR supine 2X 10 reps 2# Cues for extension  Heel slide supine 2X 10 reps   Bridging supine 2X 10 reps   Standing wall slides X 10 reps Cues to weightshift to R  Terminal knee ext against ball 2X 10 reps   Standing heel raise 2X 10 reps   Standing hip abd 2X 10 reps Yellow tband   Vaso:  R knee  X 10 minutes 34 degrees   Manual therapy:  Joint mobilization  Grade II to III for knee ext and flex  PROM  Knee flex/ext         PATIENT EDUCATION: Education details: HEP updates/modifications Person educated: Patient Education method: Consulting civil engineer, Demonstration, Verbal cues, and Handouts Education  comprehension: verbalized understanding and returned demonstration   HOME EXERCISE PROGRAM: Access Code: [email or web address] URL: [email or web address] Date: 11/23/2021 Prepared by: Almyra Free  Exercises - Supine Heel Slides  - 1 x daily - 7 x weekly - 2 sets - 10 reps - Supine Quadricep Sets  -  1 x daily - 7 x weekly - 2 sets - 10 reps - Supine Active Straight Leg Raise  - 1 x daily - 7 x weekly - 2 sets - 10 reps - Seated Long Arc Quad  - 1 x daily - 7 x weekly - 2 sets - 10 reps - Seated Knee Flexion Stretch  - 1 x daily - 7 x weekly - 2 sets - 3 reps - 20 seconds hold - Supine Bridge  - 1 x daily - 7 x weekly - 2 sets - 10 reps - Wall Squat  - 1 x daily - 7 x weekly - 1 sets - 10 reps - Hip Abduction with Resistance Loop  - 1 x daily - 7 x weekly - 2 sets - 10 reps - Single Leg Stance  - 1 x daily - 7 x weekly - 2 sets - 20 sec hold - Seated Passive Knee Extension with Weight  - 1 x daily - 7 x weekly - 2 sets - 3 min hold - Tandem Stance with Support  - 2 x daily - 7 x weekly - 1 sets - 5 reps - max hold    PT Long Term Goals -       PT LONG TERM GOAL #1   Title The patient will be indep with HEP.    Time 6    Period Weeks    Target Date 12/15/21      PT LONG TERM GOAL #2   Title The patient will improve gait speed up to > or equal to 2.6 ft/sec to demo return to full community ambulator classification of gait.    Time 6    Period Weeks    Target Date 12/15/21      PT LONG TERM GOAL #3   Title The patient will negotiate 4 steps without rails and reciprocal pattern indep.    Time 6    Period Weeks    Target Date 12/15/21      PT LONG TERM GOAL #4   Title The patient will improve R knee AROM to 3-115 degrees to demo functional motion for ADLs and IADLs.    Time 6    Period Weeks    Target Date 12/15/21      PT LONG TERM GOAL #5   Title The patient will ambulate outdoor community surfaces without a device indep.    Time 6    Period Weeks    Target Date 12/15/21               Plan -    Clinical Impression Statement Rylie is making very good progress with her ROM. Advised to focus on extension at home. She would benefit from a gastroc stretch. Still challenged by balance with gait.     Personal Factors and Comorbidities Comorbidity 1    Comorbidities HTN    Examination-Activity Limitations Locomotion Level;Squat;Stairs;Stand    Examination-Participation Restrictions Community Activity    Stability/Clinical Decision Making Stable/Uncomplicated    Rehab Potential Good    PT Frequency 2x / week    PT Duration 6 weeks    PT Treatment/Interventions ADLs/Self Care Home Management;Patient/family education;Manual techniques;Neuromuscular re-education;Moist Heat;Electrical Stimulation;Cryotherapy;Gait training;Stair training;Functional mobility training;Passive range of motion;Vasopneumatic Device;Dry needling;Scar mobilization;Therapeutic activities;Therapeutic exercise    PT Next Visit Plan Progress HEP, progress gait with SPC, work on heel strike during gait, work on extension    PT [place] and Agree with Plan of  Care Patient               [place], PT,  11/23/2021, 9:21 AM

## 2021-11-23 ENCOUNTER — Ambulatory Visit: Payer: Medicare Other | Attending: Orthopaedic Surgery | Admitting: Physical Therapy

## 2021-11-23 ENCOUNTER — Encounter: Payer: Self-pay | Admitting: Physical Therapy

## 2021-11-23 DIAGNOSIS — R2689 Other abnormalities of gait and mobility: Secondary | ICD-10-CM | POA: Insufficient documentation

## 2021-11-23 DIAGNOSIS — R600 Localized edema: Secondary | ICD-10-CM | POA: Diagnosis not present

## 2021-11-23 DIAGNOSIS — M6281 Muscle weakness (generalized): Secondary | ICD-10-CM | POA: Diagnosis not present

## 2021-11-23 DIAGNOSIS — M25561 Pain in right knee: Secondary | ICD-10-CM | POA: Diagnosis not present

## 2021-11-24 ENCOUNTER — Encounter: Payer: Medicare Other | Admitting: Physical Therapy

## 2021-11-24 NOTE — Therapy (Addendum)
OUTPATIENT PHYSICAL THERAPY TREATMENT NOTE/PROGRESS NOTE AND DISCHARGE SUMMARY   Patient Name: Amber Mccall MRN: [id number] DOB:01-09-1951, 71 y.o., female Today's Date: 11/25/2021  PCP: Riki Sheer, MD REFERRING PROVIDER: Frankey Shown, MD   PT End of Session - 11/25/21 [phone]     Visit Number 7    Number of Visits 12    Date for PT Re-Evaluation 12/15/21    Authorization Type [employer] medicare    Progress Note Due on Visit 10    PT Start Time 0925    PT Stop Time 1022    PT Time Calculation (min) 57 min    Activity Tolerance Patient tolerated treatment well    Behavior During Therapy [facility] for tasks assessed/performed             Physical Therapy Progress Note  Dates of Reporting Period: 11/03/21 to 11/25/21     Past Medical History:  Diagnosis Date   Anginal pain ([place])    pt says she feels like this may be related to acid reflux   Closed fracture of metatarsal bone(s) 01/30/2013   Depression    Essential hypertension    GERD (gastroesophageal reflux disease)    Lumbar radiculopathy    Osteoarthritis    Osteoporosis    Past Surgical History:  Procedure Laterality Date   ABDOMINAL HYSTERECTOMY     APPENDECTOMY     CATARACT EXTRACTION Bilateral 2022   C[place]ton Osteotomy w/ Bone Graft Left 04/16/2015   Left foot   IR RADIOLOGIST EVAL & MGMT  04/11/2017   OPEN REDUCTION INTERNAL FIXATION (ORIF) DISTAL PHALANX Left 10/23/2014   5th Metatarsal   TOTAL KNEE ARTHROPLASTY Right 10/18/2021   Procedure: RIGHT TOTAL KNEE ARTHROPLASTY;  Surgeon: Leandrew Koyanagi, MD;  Location: [place];  Service: Orthopedics;  Laterality: Right;   Patient Active Problem List   Diagnosis Date Noted   Status post total right knee replacement 10/18/2021   Primary osteoarthritis of right knee 10/17/2021   Spondylosis without myelopathy or radiculopathy, lumbar region 09/28/2021   Chronic right shoulder pain 08/18/2021   Adhesive capsulitis of right shoulder 08/18/2021   Gastroesophageal reflux  disease 08/18/2021   Primary osteoarthritis of left knee 07/27/2021   Primary osteoarthritis involving multiple joints 07/13/2021   Strain of right trapezius muscle 07/13/2021   Essential hypertension 05/19/2021   Hypertriglyceridemia 05/19/2021   Osteoporosis 05/19/2021   Lumbar radiculopathy 05/19/2021   Osteoarthritis 05/19/2021   Depression, major, single episode, in partial remission ([place]) 05/19/2021   Chronic bilateral low back pain 05/19/2021   Status post left foot surgery 04/23/2015   Injury of foot, left 04/08/2015   Sprain of ankle 04/08/2015   Deformity of metatarsal bone of left foot 04/08/2015   Metatarsalgia of left foot 02/10/2015   Tenosynovitis of foot 02/10/2015   Chronic pain of both knees 02/10/2015   Closed fracture of metatarsal bone 10/21/2014   Edema of left foot 10/21/2014   Closed fracture of metatarsal bone(s) 01/30/2013   Pain, foot 01/30/2013   Abnormal foot finding 01/30/2013    REFERRING DIAG: R TKR  THERAPY DIAG:  Acute pain of right knee  Muscle weakness (generalized)  Other abnormalities of gait and mobility  Localized edema  Rationale for Evaluation and Treatment Rehabilitation  PERTINENT HISTORY: HTN   PRECAUTIONS: None  SUBJECTIVE: Felt hot today. Put some ice on it. She did her exercises before she went to bed. Mainly when she walks. She reports her back is hurting also. (Through interpreter Assunta Curtis)  PAIN:  Are you having pain? Yes: NPRS scale: 3-4/10 Pain location: R TKR Pain description: sore Aggravating factors: movement Relieving factors: rest  OBJECTIVE: (objective measurements taken at eval unless otherwise dated)    AROM  11/23/21 11/25/21    Overall AROM  Deficits       AROM Assessment Site Knee       Right/Left Knee Right       Right Knee Extension -20   knee extension lag -7 -7    Right Knee Flexion 96  117 120           PROM      Overall PROM  Deficits       PROM Assessment Site Knee        Right/Left Knee Right       Right Knee Extension -12  -4 -3    Right Knee Flexion 105  125 125           Strength      Overall Strength Deficits       Overall Strength Comments can lift through partial ROM against gravity             Gait Speed: 20 ft / 8.84 sec = 2.26 ft/sec Stairs: able to climb stairs reciprocally with one UE support. Without UE support ascending is difficult and pt reports increased back pain. Some eccentric weakness with descending on R. Balance: SLS x 20 sec R, max 6 sec L; 2 incidences of LOB with turns today.   TODAY'S TREATMENT:  11/25/21 Bike L2 x 5 min Gastroc stretch R 2x 30 sec SLS x 20 sec R, max 6 sec L Bwd walking 4 x 15 ft - two LOB when turning Step ups fwd and lateral 8 inch x 10 R only Sit to stand x 10   Stairs: Reciprocal gait with one UE up and down facility stairs. Able to ascend 4 steps reciprocally without UE assist, but poor technique and unsafe. (CGA)  Gait: 60 ft tandem walking, 100 ft amb (decreased heel strike left)   Manual: passive stretching into flex and ext  Vaso  R knee x 10 min 34 deg med pressure   11/23/21: Bike L 2 x 5 min Prone knee flex stretch 2x30 sec Prone knee ext stretch x 1 min Prone HS curl 2#  2x10 Prone quad sets 5 sec hold 2x10 Supine HS stretch with strap 2x30 sec SLR 2x10 cues to DF ankle  Gait: Worked on step and stride length as well as heel strike Backwards Walking 4 x 15' Sidestepping YTB 4x15' Tandem walking 4 x 15' Static tandem balance x 2 reps each way  Manual: Passive knee ext stretch x 30 sec   Modalities: Vaso  R knee x 10 min 34 deg med pressure   11/17/21: Activity  Therex  Bike Level 1 5 minutes warm up  Prone knee flexion stretch 2x30 sec   Prone hamstring curl 2x10 1.5#  Prone hip extension 2x10   Prone Quad sets  2X 10 reps Cues for extension  Supine hip flexor stretch 2x30 sec   SLR sup 2X 10 reps Cues for extension  Supine knee ext stretch with manual assist 2x30 sec    Hamstring stretch sup 2x30 sec   SLS 3x20 sec   Standing wall slides 2X 10 reps Cues to weightshift to R  Vaso:  R knee  X 10 minutes 34 degrees   Gait  Backwards walking 4x15' By counter  Side stepping  7E08' 4th set with yellow tband  Tandem walking 4x15'    Manual therapy:  Joint mobilization  Grade II to III for knee ext and flex  PROM  Knee ext    11/15/21: Activity  Therex  Bike  Level 1 5 minutes  Quad sets supine 2X 10 reps 2# Cues for extension  SLR supine 2X 10 reps 2# Cues for extension  Heel slide supine 2X 10 reps   Bridging supine 2X 10 reps   Standing wall slides X 10 reps Cues to weightshift to R  Terminal knee ext against ball 2X 10 reps   Standing heel raise 2X 10 reps   Standing hip abd 2X 10 reps Yellow tband   Vaso:  R knee  X 10 minutes 34 degrees   Manual therapy:  Joint mobilization  Grade II to III for knee ext and flex  PROM  Knee flex/ext         PATIENT EDUCATION: Education details: HEP updates/modifications Person educated: Patient Education method: Consulting civil engineer, Demonstration, Verbal cues, and Handouts Education comprehension: verbalized understanding and returned demonstration   HOME EXERCISE PROGRAM: Access Code: [email or web address] URL: [email or web address] Date: 11/23/2021 Prepared by: Almyra Free  Exercises - Supine Heel Slides  - 1 x daily - 7 x weekly - 2 sets - 10 reps - Supine Quadricep Sets  - 1 x daily - 7 x weekly - 2 sets - 10 reps - Supine Active Straight Leg Raise  - 1 x daily - 7 x weekly - 2 sets - 10 reps - Seated Long Arc Quad  - 1 x daily - 7 x weekly - 2 sets - 10 reps - Seated Knee Flexion Stretch  - 1 x daily - 7 x weekly - 2 sets - 3 reps - 20 seconds hold - Supine Bridge  - 1 x daily - 7 x weekly - 2 sets - 10 reps - Wall Squat  - 1 x daily - 7 x weekly - 1 sets - 10 reps - Hip Abduction with Resistance Loop  - 1 x daily - 7 x weekly - 2 sets - 10 reps - Single Leg Stance  - 1 x daily - 7 x weekly - 2  sets - 20 sec hold - Seated Passive Knee Extension with Weight  - 1 x daily - 7 x weekly - 2 sets - 3 min hold - Tandem Stance with Support  - 2 x daily - 7 x weekly - 1 sets - 5 reps - max hold    PT Long Term Goals -       PT LONG TERM GOAL #1   Title The patient will be indep with HEP.    Time 6    Period Weeks    Target Date 12/15/21 Achieved 11/25/21     PT LONG TERM GOAL #2   Title The patient will improve gait speed up to > or equal to 2.6 ft/sec to demo return to full community ambulator classification of gait.    Time 6    Period Weeks    Target Date 12/15/21  Achieved 11/25/21     PT LONG TERM GOAL #3   Title The patient will negotiate 4 steps without rails and reciprocal pattern indep.    Time 6    Period Weeks    Target Date 12/15/21 Partially Met 11/25/21     PT LONG TERM GOAL #4   Title The patient will improve R knee AROM to 3-115 degrees to  demo functional motion for ADLs and IADLs.    Time 6    Period Weeks    Target Date 12/15/21  Partially Met 11/25/21     PT LONG TERM GOAL #5   Title The patient will ambulate outdoor community surfaces without a device indep.    Time 6    Period Weeks    Target Date 12/15/21  Partially met 11/25/21             Plan -    Clinical Impression Statement Kanasia is pleased with her current level of function. She is progressing well with her ROM measuring 7-120 deg today actively and 3-125 deg passively. She has partially met her LTGs. She is able to safely negotiate stairs with one UE support, however she does demonstrate weakness both ascending and descending. She reports she walks in her yard independently. She does demonstrate balance deficits L > R with SLS likely due to low back issues. She also demonstrated 2 incidences of LOB with turns today. She would benefit from ongoing PT to address these deficits and fully meet her LTGs.   Personal Factors and Comorbidities Comorbidity 1    Comorbidities HTN    Examination-Activity  Limitations Locomotion Level;Squat;Stairs;Stand    Examination-Participation Restrictions Community Activity    Stability/Clinical Decision Making Stable/Uncomplicated    Rehab Potential Good    PT Frequency 2x / week    PT Duration 6 weeks    PT Treatment/Interventions ADLs/Self Care Home Management;Patient/family education;Manual techniques;Neuromuscular re-education;Moist Heat;Electrical Stimulation;Cryotherapy;Gait training;Stair training;Functional mobility training;Passive range of motion;Vasopneumatic Device;Dry needling;Scar mobilization;Therapeutic activities;Therapeutic exercise    PT Next Visit Plan  Pt to decide if she will continue PT after MD visit 06/30/21.    PT Home Exercise Plan [email or web address]    Consulted and Agree with Plan of Care Patient               Elmus Mathes, PT,  11/25/2021, 10:40 AM   PHYSICAL THERAPY DISCHARGE SUMMARY  Visits from Start of Care: 7  Current functional level related to goals / functional outcomes: See above   Remaining deficits: See above   Education / Equipment: HEP   Patient agrees to discharge. Patient goals were partially met. Patient is being discharged due to being pleased with the current functional level.  Madelyn Flavors, PT 03/03/22 12:17 PM  [facility] [address] [place], [place] [address]  [phone] (office) [phone] (fax)

## 2021-11-25 ENCOUNTER — Encounter: Payer: Self-pay | Admitting: Physical Therapy

## 2021-11-25 ENCOUNTER — Ambulatory Visit: Payer: Medicare Other | Admitting: Physical Therapy

## 2021-11-25 DIAGNOSIS — R600 Localized edema: Secondary | ICD-10-CM

## 2021-11-25 DIAGNOSIS — M25561 Pain in right knee: Secondary | ICD-10-CM

## 2021-11-25 DIAGNOSIS — M6281 Muscle weakness (generalized): Secondary | ICD-10-CM

## 2021-11-25 DIAGNOSIS — R2689 Other abnormalities of gait and mobility: Secondary | ICD-10-CM

## 2021-11-30 ENCOUNTER — Ambulatory Visit (INDEPENDENT_AMBULATORY_CARE_PROVIDER_SITE_OTHER): Payer: Medicare Other | Admitting: Orthopaedic Surgery

## 2021-11-30 ENCOUNTER — Telehealth: Payer: Self-pay | Admitting: *Deleted

## 2021-11-30 ENCOUNTER — Ambulatory Visit (INDEPENDENT_AMBULATORY_CARE_PROVIDER_SITE_OTHER): Payer: Medicare Other

## 2021-11-30 DIAGNOSIS — Z96651 Presence of right artificial knee joint: Secondary | ICD-10-CM

## 2021-11-30 MED ORDER — CEPHALEXIN 500 MG PO CAPS
500.0000 mg | ORAL_CAPSULE | Freq: Four times a day (QID) | ORAL | 0 refills | Status: DC
Start: 2021-11-30 — End: 2022-02-08

## 2021-11-30 MED ORDER — AMOXICILLIN 500 MG PO CAPS: 2000.0000 mg | ORAL_CAPSULE | Freq: Once | ORAL | 6 refills | Status: AC

## 2021-11-30 MED ORDER — DIAZEPAM 5 MG PO TABS
ORAL_TABLET | ORAL | 0 refills | Status: DC
Start: 2021-11-30 — End: 2022-03-07

## 2021-11-30 MED ORDER — MUPIROCIN 2 % EX OINT: 1.0000 | TOPICAL_OINTMENT | Freq: Two times a day (BID) | CUTANEOUS | 2 refills | Status: DC

## 2021-11-30 NOTE — Telephone Encounter (Signed)
Ortho bundle 30 day call completed. °

## 2021-11-30 NOTE — Progress Notes (Signed)
Post-Op Visit Note   Patient: Amber Mccall           Date of Birth: 25-Aug-1950           MRN: [id number] Visit Date: 11/30/2021 PCP: Shelda Pal, DO   Assessment & Plan:  Chief Complaint:  Chief Complaint  Patient presents with   Right Knee - Routine Post Op   Visit Diagnoses:  1. Status post total right knee replacement     Plan: Patient is 6 weeks status post right total knee on 10/18/2021.  Doing well with her range of motion and rehab and outpatient PT.  Walks independently now.  Noticed a sore on the lateral aspect of her ankle.  Examination of the right knee shows a fully healed surgical scar.  Excellent range of motion with greater than 125 degrees of flexion.  Stable to varus valgus.  No signs infection.  Mild expected postoperative swelling.  X-rays show stable implant.  Right ankle shows a small superficial wound without any evidence of infection.  Appears to have been prior blister or some mechanical irritation.  In regards to the right knee she is doing very well.  At this point she can discontinue outpatient PT as she has been very diligent about doing home exercises.  For the ankle wound I have sent in prescription for mupirocin and 10 days of Keflex.  Dental prophylaxis reinforced.  Recheck in 6 weeks.  Burt Knack met with the patient as well today.  Follow-Up Instructions: Return in about 6 weeks (around 01/11/2022).   Orders:  Orders Placed This Encounter  Procedures   XR Knee 1-2 Views Right   Meds ordered this encounter  Medications   cephALEXin (KEFLEX) 500 MG capsule    Sig: Take 1 capsule (500 mg total) by mouth 4 (four) times daily.    Dispense:  40 capsule    Refill:  0   mupirocin ointment (BACTROBAN) 2 %    Sig: Apply 1 Application topically 2 (two) times daily.    Dispense:  22 g    Refill:  2   amoxicillin (AMOXIL) 500 MG capsule    Sig: Take 4 capsules (2,000 mg total) by mouth once for 1 dose. 30 minutes before dentist appointment     Dispense:  4 capsule    Refill:  6    Imaging: XR Knee 1-2 Views Right  Result Date: 11/30/2021 Stable total knee replacement in good alignment    PMFS History: Patient Active Problem List   Diagnosis Date Noted   Status post total right knee replacement 10/18/2021   Primary osteoarthritis of right knee 10/17/2021   Spondylosis without myelopathy or radiculopathy, lumbar region 09/28/2021   Chronic right shoulder pain 08/18/2021   Adhesive capsulitis of right shoulder 08/18/2021   Gastroesophageal reflux disease 08/18/2021   Primary osteoarthritis of left knee 07/27/2021   Primary osteoarthritis involving multiple joints 07/13/2021   Strain of right trapezius muscle 07/13/2021   Essential hypertension 05/19/2021   Hypertriglyceridemia 05/19/2021   Osteoporosis 05/19/2021   Lumbar radiculopathy 05/19/2021   Osteoarthritis 05/19/2021   Depression, major, single episode, in partial remission ([place]) 05/19/2021   Chronic bilateral low back pain 05/19/2021   Status post left foot surgery 04/23/2015   Injury of foot, left 04/08/2015   Sprain of ankle 04/08/2015   Deformity of metatarsal bone of left foot 04/08/2015   Metatarsalgia of left foot 02/10/2015   Tenosynovitis of foot 02/10/2015   Chronic pain of both knees  02/10/2015   Closed fracture of metatarsal bone 10/21/2014   Edema of left foot 10/21/2014   Closed fracture of metatarsal bone(s) 01/30/2013   Pain, foot 01/30/2013   Abnormal foot finding 01/30/2013   Past Medical History:  Diagnosis Date   Anginal pain ([place])    pt says she feels like this may be related to acid reflux   Closed fracture of metatarsal bone(s) 01/30/2013   Depression    Essential hypertension    GERD (gastroesophageal reflux disease)    Lumbar radiculopathy    Osteoarthritis    Osteoporosis     Family History  Problem Relation Age of Onset   Colon cancer Neg Hx    Rectal cancer Neg Hx    Stomach cancer Neg Hx     Past Surgical  History:  Procedure Laterality Date   ABDOMINAL HYSTERECTOMY     APPENDECTOMY     CATARACT EXTRACTION Bilateral 2022   Cotton Osteotomy w/ Bone Graft Left 04/16/2015   Left foot   IR RADIOLOGIST EVAL & MGMT  04/11/2017   OPEN REDUCTION INTERNAL FIXATION (ORIF) DISTAL PHALANX Left 10/23/2014   5th Metatarsal   TOTAL KNEE ARTHROPLASTY Right 10/18/2021   Procedure: RIGHT TOTAL KNEE ARTHROPLASTY;  Surgeon: Leandrew Koyanagi, MD;  Location: [facility];  Service: Orthopedics;  Laterality: Right;   Social History   Occupational History   Occupation: retired  Tobacco Use   Smoking status: Never   Smokeless tobacco: Never  Vaping Use   Vaping Use: Never used  Substance and Sexual Activity   Alcohol use: Not Currently   Drug use: Not Currently   Sexual activity: Not on file

## 2021-11-30 NOTE — Telephone Encounter (Signed)
Scheduled for MBB 12/23/21. I have reviewed procedure with her son and advised no medications should be held (she is on xarelto) for this procedure. She would like to have a pre med so I will send in a 5 mg diazepam to be taken 30 minutes prior to procedure and she will need to have a driver.

## 2021-12-01 ENCOUNTER — Ambulatory Visit: Payer: Medicare Other | Admitting: Physical Therapy

## 2021-12-03 ENCOUNTER — Encounter: Payer: Medicare Other | Admitting: Physical Therapy

## 2021-12-08 ENCOUNTER — Encounter: Payer: Medicare Other | Admitting: Physical Therapy

## 2021-12-10 ENCOUNTER — Encounter: Payer: Medicare Other | Admitting: Rehabilitative and Restorative Service Providers"

## 2021-12-23 ENCOUNTER — Encounter: Payer: Self-pay | Admitting: Physical Medicine & Rehabilitation

## 2021-12-23 ENCOUNTER — Encounter: Payer: Medicare Other | Attending: Physical Medicine & Rehabilitation | Admitting: Physical Medicine & Rehabilitation

## 2021-12-23 VITALS — BP 138/76 | HR 81 | Temp 98.4°F | Ht 60.0 in | Wt 134.6 lb

## 2021-12-23 DIAGNOSIS — M4726 Other spondylosis with radiculopathy, lumbar region: Secondary | ICD-10-CM | POA: Diagnosis not present

## 2021-12-23 DIAGNOSIS — M5441 Lumbago with sciatica, right side: Secondary | ICD-10-CM | POA: Diagnosis not present

## 2021-12-23 DIAGNOSIS — G8929 Other chronic pain: Secondary | ICD-10-CM | POA: Diagnosis not present

## 2021-12-23 DIAGNOSIS — M5440 Lumbago with sciatica, unspecified side: Secondary | ICD-10-CM

## 2021-12-23 MED ORDER — LIDOCAINE HCL (PF) 1 % IJ SOLN
5.0000 mL | Freq: Once | INTRAMUSCULAR | Status: AC
  Administered 2021-12-23: 5 mL

## 2021-12-23 MED ORDER — LIDOCAINE HCL (PF) 2 % IJ SOLN
2.0000 mL | Freq: Once | INTRAMUSCULAR | Status: AC
  Administered 2021-12-23: 2 mL

## 2021-12-23 NOTE — Patient Instructions (Signed)

## 2021-12-23 NOTE — Progress Notes (Signed)
Right lumbar L2, L3, L4 medial branch blocks and L5 dorsal ramus injection under fluoroscopic guidance  Indication: Right Lumbar pain which is not relieved by medication management or other conservative care and interfering with self-care and mobility.  Informed consent was obtained after describing risks and benefits of the procedure with the patient, this includes bleeding, bruising, infection, paralysis and medication side effects. The patient wishes to proceed and has given written consent. The patient was placed in a prone position. The lumbar area was marked and prepped with Betadine. One ML of 1% lidocaine was injected into each of 3 areas into the skin and subcutaneous tissue. Then a 22-gauge 3.5  spinal needle was inserted targeting the junction of the Right S1 superior articular process and sacral ala junction. Needle was advanced under fluoroscopic guidance. Bone contact was made.Omnipaque 180 was injected x0.5 mL demonstrating no intravascular uptake. Then a solution containing 2% MPF lidocaine was injected x0.5 mL. Then the Right L5 superior articular process in transverse process junction was targeted. Bone contact was made.Omnipaque 180  was injected x0.5 mL demonstrating no intravascular uptake. Then a solution containing 2% MPF lidocaine was injected x0.5 mL. Then the Right L4 superior articular process in transverse process junction was targeted. Bone contact was made. Omnipaque 180 was injected x0.5 mL demonstrating no intravascular uptake. Then a solution containing2% MPF lidocaine was injected x0.5 mL.  Then the Right L3 superior articular process in transverse process junction was targeted. Bone contact was made. Omnipaque 180 was injected x0.5 mL demonstrating no intravascular uptake. Then a solution containing2% MPF lidocaine was injected x0.5 mL Patient tolerated procedure well. Post procedure instructions were given. Please refer to post procedure form.   Through interpreter pt report  no change in Right sided pain , pre /post injection

## 2021-12-23 NOTE — Progress Notes (Signed)
  PROCEDURE RECORD [facility]   Name: Amber Mccall DOB:1950/05/23 MRN: [id number]  Date:12/23/2021  Physician: Alysia Penna, MD    Nurse/CMA: Kristine Garbe R RMA  Allergies: No Known Allergies  Consent Signed: Yes.    Is patient diabetic? No.  CBG today? .  Pregnant: No. LMP: No LMP recorded. Patient has had a hysterectomy. (age [age])  Anticoagulants: no Anti-inflammatory: no Antibiotics: no  Procedure: Medial Branch Block L2,3,4,5  Position: Prone Start Time: 2:16PM  End Time: 2:24PM  Fluoro Time: 94  RN/CMA [place] R RMA Yasmine Kilbourne R RMA    Time 1:51 PM 2:24PM    BP 138/76 155/79    Pulse 81 83    Respirations 16 16    O2 Sat 94 95    S/S 6 6    Pain Level 9/10 0/10     D/C home with Son, patient A & O X 3, D/C instructions reviewed, and sits independently.

## 2022-01-11 ENCOUNTER — Ambulatory Visit (INDEPENDENT_AMBULATORY_CARE_PROVIDER_SITE_OTHER): Payer: Medicare Other | Admitting: Physician Assistant

## 2022-01-11 ENCOUNTER — Telehealth: Payer: Self-pay | Admitting: *Deleted

## 2022-01-11 DIAGNOSIS — Z96651 Presence of right artificial knee joint: Secondary | ICD-10-CM

## 2022-01-11 NOTE — Progress Notes (Signed)
Post-Op Visit Note   Patient: Amber Mccall           Date of Birth: 1950-10-19           MRN: [id number] Visit Date: 01/11/2022 PCP: Shelda Pal, DO   Assessment & Plan:  Chief Complaint:  Chief Complaint  Patient presents with   Right Knee - Routine Post Op   Visit Diagnoses:  1. Hx of total knee replacement, right     Plan: Patient is a pleasant 71 year old female who is here today with her son acting as an interpreter.  She is approximately 12 weeks status post right total knee replacement 10/18/2021.  She has been doing okay but still notes pain at times.  She has finished physical therapy.  Examination of the right knee reveals fully healed surgical incision without complication.  Range of motion 0 to 120 degrees.  Stable to valgus and varus stress.  She is neurovascular tact distally.  At this point, she will continue to advance with activity as tolerated.  Dental prophylaxis reinforced.  Follow-up in 3 months for recheck.  Call with concerns or questions.  Follow-Up Instructions: Return in about 3 months (around 04/12/2022).   Orders:  No orders of the defined types were placed in this encounter.  No orders of the defined types were placed in this encounter.   Imaging: No new imaging  PMFS History: Patient Active Problem List   Diagnosis Date Noted   Status post total right knee replacement 10/18/2021   Primary osteoarthritis of right knee 10/17/2021   Spondylosis without myelopathy or radiculopathy, lumbar region 09/28/2021   Chronic right shoulder pain 08/18/2021   Adhesive capsulitis of right shoulder 08/18/2021   Gastroesophageal reflux disease 08/18/2021   Primary osteoarthritis of left knee 07/27/2021   Primary osteoarthritis involving multiple joints 07/13/2021   Strain of right trapezius muscle 07/13/2021   Essential hypertension 05/19/2021   Hypertriglyceridemia 05/19/2021   Osteoporosis 05/19/2021   Lumbar radiculopathy 05/19/2021    Osteoarthritis 05/19/2021   Depression, major, single episode, in partial remission ([place]) 05/19/2021   Chronic bilateral low back pain 05/19/2021   Status post left foot surgery 04/23/2015   Injury of foot, left 04/08/2015   Sprain of ankle 04/08/2015   Deformity of metatarsal bone of left foot 04/08/2015   Metatarsalgia of left foot 02/10/2015   Tenosynovitis of foot 02/10/2015   Chronic pain of both knees 02/10/2015   Closed fracture of metatarsal bone 10/21/2014   Edema of left foot 10/21/2014   Closed fracture of metatarsal bone(s) 01/30/2013   Pain, foot 01/30/2013   Abnormal foot finding 01/30/2013   Past Medical History:  Diagnosis Date   Anginal pain ([place])    pt says she feels like this may be related to acid reflux   Closed fracture of metatarsal bone(s) 01/30/2013   Depression    Essential hypertension    GERD (gastroesophageal reflux disease)    Lumbar radiculopathy    Osteoarthritis    Osteoporosis     Family History  Problem Relation Age of Onset   Colon cancer Neg Hx    Rectal cancer Neg Hx    Stomach cancer Neg Hx     Past Surgical History:  Procedure Laterality Date   ABDOMINAL HYSTERECTOMY     APPENDECTOMY     CATARACT EXTRACTION Bilateral 2022   Cotton Osteotomy w/ Bone Graft Left 04/16/2015   Left foot   IR RADIOLOGIST EVAL & MGMT  04/11/2017   OPEN  REDUCTION INTERNAL FIXATION (ORIF) DISTAL PHALANX Left 10/23/2014   5th Metatarsal   TOTAL KNEE ARTHROPLASTY Right 10/18/2021   Procedure: RIGHT TOTAL KNEE ARTHROPLASTY;  Surgeon: Leandrew Koyanagi, MD;  Location: [facility];  Service: Orthopedics;  Laterality: Right;   Social History   Occupational History   Occupation: retired  Tobacco Use   Smoking status: Never   Smokeless tobacco: Never  Vaping Use   Vaping Use: Never used  Substance and Sexual Activity   Alcohol use: Not Currently   Drug use: Not Currently   Sexual activity: Not on file

## 2022-01-11 NOTE — Telephone Encounter (Signed)
Ortho bundle 90 day call completed. 

## 2022-02-08 ENCOUNTER — Encounter: Payer: Medicare Other | Attending: Physical Medicine & Rehabilitation | Admitting: Physical Medicine & Rehabilitation

## 2022-02-08 ENCOUNTER — Encounter: Payer: Self-pay | Admitting: Physical Medicine & Rehabilitation

## 2022-02-08 VITALS — BP 122/68 | HR 72 | Wt 136.0 lb

## 2022-02-08 DIAGNOSIS — M47816 Spondylosis without myelopathy or radiculopathy, lumbar region: Secondary | ICD-10-CM | POA: Diagnosis not present

## 2022-02-08 NOTE — Progress Notes (Signed)
Subjective:    Patient ID: Amber Mccall, female    DOB: May 25, 1950, 71 y.o.   MRN: [id number]  HPI 71 year old female with history of chronic low back pain mainly right-sided.  She has a history of left L4-5 laminotomy.  She does not have any significant pain on the left side.  She underwent right L2-L3-L4 medial branch and right L5 dorsal ramus injections under fluoroscopic guidance.  The patient had less than 50% relief approximately 30%.  The patient would like another injection.  She states that the pain relief has lasted up until this time although it is to a mild degree.  She does not have significant pain going down the leg.  She is here with a [place] language interpreter. CLINICAL DATA:  Chronic low back pain with sciatica, sciatica laterality unspecified, unspecified back pain laterality M54.40, G89.29 (ICD-10-CM). Chronic low back with bilateral leg pain related to an old injury 20 years ago.   EXAM: MRI LUMBAR SPINE WITHOUT CONTRAST   TECHNIQUE: Multiplanar, multisequence MR imaging of the lumbar spine was performed. No intravenous contrast was administered.   COMPARISON:  MR lumbar 01/06/2018; X-ray lumbar 10/07/2020.   FINDINGS: Segmentation:  Standard.   Alignment: Kyphotic deformity and levoconvex scoliosis at the thoracolumbar junction related to T12 chronic compression fracture. Trace retrolisthesis at L3-4.   Vertebrae: No acute fracture, evidence of discitis, or bone lesion. Chronic compression fracture of the T12 vertebral body status post vertebral augmentation.   Conus medullaris and cauda equina: Conus extends to the T12 level. Conus and cauda equina appear normal.   Paraspinal and other soft tissues: Unchanged atrophy of paraspinal musculature.   Disc levels:   T11-12: No spinal.   T12-L1: No spinal canal or neural foraminal.   L1-2: Shallow disc bulge with associated osteophytic component. No significant spinal canal or neural foraminal stenosis.    L2-3 shallow disc bulge. No spinal canal or neural foraminal stenosis.   L3-4: Disc bulge and mild facet degenerative changes resulting in mild bilateral neural foraminal narrowing, unchanged. No significant spinal canal stenosis. Unchanged appearance of interspinous degenerative changes.   L4-5: Postsurgical changes from left laminotomy. There appears to be anterior retraction of the left lateral aspect of the thecal sac and left L5 nerve root sleeve which could be due to adherence (series 5, image 32). Disc bulge and mild facet degenerative changes resulting in bilateral neural foraminal narrowing. No significant spinal canal stenosis.   L5-S1: Disc bulge with associated osteophytic component and mild facet degenerative changes resulting in mild left neural foraminal narrowing. No significant spinal canal stenosis.   IMPRESSION: 1. Chronic compression fracture of the T12 vertebral body status post vertebral augmentation. 2. Degenerative changes of the lumbar spine without high-grade spinal canal or neural foraminal stenosis. 3. At L4-5, there appears to be mild anterior retraction of the left lateral aspect of the thecal sac and left L5 nerve root sleeve which could be due to adherence.     Electronically Signed   By: Pedro Earls M.D.   On: 08/23/2021 10:18  12/23/21 Right lumbar L2, L3, L4 medial branch blocks and L5 dorsal ramus injection under fluoroscopic guidance    RN/CMA Aldona Bar RMA Kristine Garbe R RMA      Time 1:51 PM 2:24PM      BP 138/76 155/79      Pulse 81 83      Respirations 16 16      O2 Sat 94 95  S/S 6 6      Pain Level 9/10 0/10      Pt states her pain never went down to 0 but went down to 7-8  then 5-6 next day .  The patient's interpreter states that the patient never got down to a 0 pain level. Pain Inventory Average Pain 6 Pain Right Now 6 My pain is constant, dull, and aching  In the last 24 hours, has pain interfered with  the following? General activity 5 Relation with others 5 Enjoyment of life 5 What TIME of day is your pain at its worst? daytime Sleep (in general) NA  Pain is worse with: sitting Pain improves with: rest Relief from Meds: 6  Family History  Problem Relation Age of Onset   Colon cancer Neg Hx    Rectal cancer Neg Hx    Stomach cancer Neg Hx    Social History   Socioeconomic History   Marital status: Widowed    Spouse name: Not on file   Number of children: 3   Years of education: Not on file   Highest education level: Not on file  Occupational History   Occupation: retired  Tobacco Use   Smoking status: Never   Smokeless tobacco: Never  Vaping Use   Vaping Use: Never used  Substance and Sexual Activity   Alcohol use: Not Currently   Drug use: Not Currently   Sexual activity: Not on file  Other Topics Concern   Not on file  Social History Narrative   Not on file   Social Determinants of Health   Financial Resource Strain: Not on file  Food Insecurity: Not on file  Transportation Needs: Not on file  Physical Activity: Not on file  Stress: Not on file  Social Connections: Not on file   Past Surgical History:  Procedure Laterality Date   ABDOMINAL HYSTERECTOMY     APPENDECTOMY     CATARACT EXTRACTION Bilateral 2022   Cotton Osteotomy w/ Bone Graft Left 04/16/2015   Left foot   IR RADIOLOGIST EVAL & MGMT  04/11/2017   OPEN REDUCTION INTERNAL FIXATION (ORIF) DISTAL PHALANX Left 10/23/2014   5th Metatarsal   TOTAL KNEE ARTHROPLASTY Right 10/18/2021   Procedure: RIGHT TOTAL KNEE ARTHROPLASTY;  Surgeon: Leandrew Koyanagi, MD;  Location: [facility];  Service: Orthopedics;  Laterality: Right;   Past Surgical History:  Procedure Laterality Date   ABDOMINAL HYSTERECTOMY     APPENDECTOMY     CATARACT EXTRACTION Bilateral 2022   Cotton Osteotomy w/ Bone Graft Left 04/16/2015   Left foot   IR RADIOLOGIST EVAL & MGMT  04/11/2017   OPEN REDUCTION INTERNAL FIXATION (ORIF)  DISTAL PHALANX Left 10/23/2014   5th Metatarsal   TOTAL KNEE ARTHROPLASTY Right 10/18/2021   Procedure: RIGHT TOTAL KNEE ARTHROPLASTY;  Surgeon: Leandrew Koyanagi, MD;  Location: [facility];  Service: Orthopedics;  Laterality: Right;   Past Medical History:  Diagnosis Date   Anginal pain ([place])    pt says she feels like this may be related to acid reflux   Closed fracture of metatarsal bone(s) 01/30/2013   Depression    Essential hypertension    GERD (gastroesophageal reflux disease)    Lumbar radiculopathy    Osteoarthritis    Osteoporosis    BP 122/68   Pulse 72   Wt 136 lb (61.7 kg)   SpO2 93%   BMI 26.56 kg/m   Opioid Risk Score:   Fall Risk Score:  `1  Depression screen  PHQ 2/9     02/08/2022    3:47 PM 07/13/2021    2:28 PM 05/19/2021    1:40 PM  Depression screen PHQ 2/9  Decreased Interest 0 0 0  Down, Depressed, Hopeless 0 0 0  PHQ - 2 Score 0 0 0     Review of Systems  Constitutional: Negative.   HENT: Negative.    Eyes: Negative.   Respiratory: Negative.    Cardiovascular: Negative.   Gastrointestinal: Negative.   Endocrine: Negative.   Genitourinary: Negative.   Musculoskeletal:  Positive for back pain.  Skin: Negative.   Allergic/Immunologic: Negative.   Neurological: Negative.   Hematological: Negative.   Psychiatric/Behavioral:  Positive for dysphoric mood.   All other systems reviewed and are negative.      Objective:   Physical Exam  Well-developed well-nourished female no acute distress Mood and affect are appropriate No significant tenderness to palpation in the lumbar paraspinal area on the right or left side. No pain with forward bending Patient does have pain with extension.  She indicates that her pain is at the iliac crest and above. Gait without evidence of toe drag or knee instability no antalgia.      Assessment & Plan:  1.  Chronic lumbar pain with history of left L4-5 laminotomy.  Her pain is mainly right-sided.  It appears to be  mediated by facet joints however the previous medial branch blocks were not 50% or more effective.  Given her pain distribution we will try injecting L1 L2-L3-L4 medial branches under fluoroscopic guidance.  Return in approximate 4 weeks Right L1,2,3,4 medial branch blocks Above was discussed with the patient as well as her interpreter.

## 2022-03-01 ENCOUNTER — Ambulatory Visit: Payer: Medicare Other | Admitting: Family Medicine

## 2022-03-07 ENCOUNTER — Telehealth: Payer: Self-pay | Admitting: Family Medicine

## 2022-03-07 ENCOUNTER — Ambulatory Visit (INDEPENDENT_AMBULATORY_CARE_PROVIDER_SITE_OTHER): Payer: Medicare Other | Admitting: Family Medicine

## 2022-03-07 ENCOUNTER — Other Ambulatory Visit (HOSPITAL_COMMUNITY): Payer: Self-pay

## 2022-03-07 ENCOUNTER — Encounter: Payer: Self-pay | Admitting: Family Medicine

## 2022-03-07 VITALS — BP 136/76 | HR 90 | Temp 97.0°F | Ht 61.5 in | Wt 133.5 lb

## 2022-03-07 DIAGNOSIS — F324 Major depressive disorder, single episode, in partial remission: Secondary | ICD-10-CM | POA: Diagnosis not present

## 2022-03-07 DIAGNOSIS — E781 Pure hyperglyceridemia: Secondary | ICD-10-CM

## 2022-03-07 DIAGNOSIS — D649 Anemia, unspecified: Secondary | ICD-10-CM

## 2022-03-07 DIAGNOSIS — M25511 Pain in right shoulder: Secondary | ICD-10-CM

## 2022-03-07 DIAGNOSIS — G8929 Other chronic pain: Secondary | ICD-10-CM | POA: Diagnosis not present

## 2022-03-07 MED ORDER — TRAZODONE HCL 100 MG PO TABS: 100.0000 mg | ORAL_TABLET | Freq: Every day | ORAL | 2 refills | Status: DC

## 2022-03-07 MED ORDER — SERTRALINE HCL 50 MG PO TABS: 50.0000 mg | ORAL_TABLET | Freq: Every day | ORAL | 2 refills | Status: DC

## 2022-03-07 NOTE — Telephone Encounter (Signed)
Pharmacy Patient Advocate Encounter  Insurance verification completed.    The patient is insured through [employer]   Ran test claims for: Prolia '60MG'$ .  Pharmacy benefit copay: $0.00

## 2022-03-07 NOTE — Telephone Encounter (Signed)
Anemia- CBC, hypertriglyceridemia- CMP, lipid panel; OK to order. Ty.

## 2022-03-07 NOTE — Telephone Encounter (Signed)
Prolia BIV submitted

## 2022-03-07 NOTE — Telephone Encounter (Signed)
Please see below.

## 2022-03-07 NOTE — Telephone Encounter (Signed)
Put in future orders for lab

## 2022-03-07 NOTE — Telephone Encounter (Signed)
Patients last Prolia was on 08/25/2021 at [facility] Please do the Prior auth for her to receive her next on at our office.

## 2022-03-07 NOTE — Telephone Encounter (Signed)
Patient would like to come in a week prior to her CPE for lab work. Please advise.

## 2022-03-07 NOTE — Patient Instructions (Addendum)
If you do not hear anything about your referral in the next 1-2 weeks, call our office and ask for an update.  Ice/cold pack over area for 10-15 min twice daily.  Heat (pad or rice pillow in microwave) over affected area, 10-15 minutes twice daily.   Add Pepcid (famotidine) 20 mg 1-2 times daily as needed along with your Protonix.   Stay hydrated and get up slowly.   Let us know if you need anything.

## 2022-03-07 NOTE — Telephone Encounter (Signed)
Called the patients sister (on [place]) left detailed message to call and schedule a lab appt.

## 2022-03-07 NOTE — Progress Notes (Signed)
Chief Complaint  Patient presents with   Follow-up    Medications to be refilled today    Subjective Amber Mccall presents for f/u anxiety/depression. Here w friend who helps interpret ([place]).   Pt is currently being treated with Zoloft 50 mg/d, trazodone 100 mg qhs. .  Reports doing well since treatment. Ran out 5 weeks ago and has had issues since running out.  No thoughts of harming self or others. No self-medication with alcohol, prescription drugs or illicit drugs. Pt is not following with a counselor/psychologist.  Patient's right shoulder plain continues.  She has been using hydrocodone, meloxicam, and Tylenol with little relief.  She was given a home exercise program from me and additionally saw physical therapy.  These were not helpful.  She is requesting to see a specialist.  She has never had any imaging of this area.  No neurologic signs or symptoms, redness, bruising, or swelling.  Past Medical History:  Diagnosis Date   Anginal pain ([place])    pt says she feels like this may be related to acid reflux   Closed fracture of metatarsal bone(s) 01/30/2013   Depression    Essential hypertension    GERD (gastroesophageal reflux disease)    Lumbar radiculopathy    Osteoarthritis    Osteoporosis    Allergies as of 03/07/2022   No Known Allergies      Medication List        Accurate as of March 07, 2022  1:21 PM. If you have any questions, ask your nurse or doctor.          STOP taking these medications    diazepam 5 MG tablet Commonly known as: VALIUM Stopped by: Shelda Pal, DO       TAKE these medications    amLODipine 10 MG tablet Commonly known as: NORVASC TAKE 1 TABLET(10 MG) BY MOUTH DAILY   calcium citrate 950 (200 Ca) MG tablet Commonly known as: CALCITRATE - dosed in mg elemental calcium Take 1 tablet by mouth daily.   diclofenac Sodium 1 % Gel Commonly known as: VOLTAREN Apply 2 g topically 4 (four) times daily.    gabapentin 100 MG capsule Commonly known as: NEURONTIN Take 100 mg by mouth daily.   HYDROcodone-acetaminophen 5-325 MG tablet Commonly known as: NORCO/VICODIN Take 1 tablet by mouth every 6 (six) hours as needed for moderate pain.   pantoprazole 40 MG tablet Commonly known as: PROTONIX Take 1 tablet (40 mg total) by mouth daily.   sertraline 50 MG tablet Commonly known as: ZOLOFT Take 1 tablet (50 mg total) by mouth daily.   tiZANidine 4 MG capsule Commonly known as: ZANAFLEX Take 4 mg by mouth 3 (three) times daily as needed for muscle spasms.   traZODone 100 MG tablet Commonly known as: DESYREL Take 1 tablet (100 mg total) by mouth at bedtime.   VITAMIN D (CHOLECALCIFEROL) PO Take 1 capsule by mouth daily.        Exam BP 136/76 (BP Location: Left Arm, Cuff Size: Normal)   Pulse 90   Temp (!) 97 F (36.1 C) (Oral)   Ht 5' 1.5" (1.562 m)   Wt 133 lb 8 oz (60.6 kg)   SpO2 96%   BMI 24.82 kg/m  General:  well developed, well nourished, in no apparent distress MSK: Decreased active and passive range of motion.  Positive Neer's, Hawkins, empty can; liftoff equivocal; negative O'Briens, crossover, speeds Lungs:  No respiratory distress Psych: well oriented with normal range  of affect and age-appropriate judgement/insight, alert and oriented x4.  Assessment and Plan  Chronic right shoulder pain - Plan: Ambulatory referral to Orthopedic Surgery  Depression, major, single episode, in partial remission ([place]) - Plan: sertraline (ZOLOFT) 50 MG tablet, traZODone (DESYREL) 100 MG tablet  Chronic, uncontrolled.  Refer to Ortho.  Continue meloxicam, Tylenol, home exercise program, hydrocodone. Chronic, technically uncontrolled.  Restart sertraline 50 mg daily and Zoloft 100 mg daily. F/u in 6 months for physical or as needed. The patient voiced understanding and agreement to the plan.  [place], DO 03/07/22 1:21 PM

## 2022-03-15 ENCOUNTER — Ambulatory Visit (INDEPENDENT_AMBULATORY_CARE_PROVIDER_SITE_OTHER): Payer: Medicare Other

## 2022-03-15 ENCOUNTER — Ambulatory Visit (INDEPENDENT_AMBULATORY_CARE_PROVIDER_SITE_OTHER): Payer: Medicare Other | Admitting: Orthopaedic Surgery

## 2022-03-15 DIAGNOSIS — M25511 Pain in right shoulder: Secondary | ICD-10-CM | POA: Diagnosis not present

## 2022-03-15 DIAGNOSIS — G8929 Other chronic pain: Secondary | ICD-10-CM | POA: Diagnosis not present

## 2022-03-15 DIAGNOSIS — M5412 Radiculopathy, cervical region: Secondary | ICD-10-CM

## 2022-03-15 MED ORDER — PREDNISONE 10 MG (21) PO TBPK: ORAL_TABLET | ORAL | 3 refills | Status: DC

## 2022-03-15 NOTE — Progress Notes (Signed)
Office Visit Note   Patient: Amber Mccall           Date of Birth: [birth year]           MRN: [id number] Visit Date: 03/15/2022              Requested by: Shelda Pal, DO [address] [place],  [place] [address] PCP: Shelda Pal, DO   Assessment & Plan: Visit Diagnoses:  1. Chronic right shoulder pain   2. Cervical radiculopathy     Plan: Impression is right shoulder pain and probable cervical radiculopathy.  Treatment options were reviewed and she would like to try a course of prednisone.  She will follow-up as needed.  Follow-Up Instructions: No follow-ups on file.   Orders:  Orders Placed This Encounter  Procedures   XR Shoulder Right   Meds ordered this encounter  Medications   predniSONE (STERAPRED UNI-PAK 21 TAB) 10 MG (21) TBPK tablet    Sig: Take as directed    Dispense:  21 tablet    Refill:  3      Procedures: No procedures performed   Clinical Data: No additional findings.   Subjective: Chief Complaint  Patient presents with   Right Shoulder - Pain    HPI Amber Mccall is a 71 year old [place] female here with her son who is serving as the interpreter.  Comes in today for reports of pain to the top of her shoulder and the back of the neck for over a year.  Has some numbness that travels down into the fingers.  She did do 6 weeks of physical therapy for shoulder before she had her knee replaced earlier this year and has not felt any improvement from this.  Review of Systems  Constitutional: Negative.   HENT: Negative.    Eyes: Negative.   Respiratory: Negative.    Cardiovascular: Negative.   Endocrine: Negative.   Musculoskeletal: Negative.   Neurological: Negative.   Hematological: Negative.   Psychiatric/Behavioral: Negative.    All other systems reviewed and are negative.    Objective: Vital Signs: There were no vitals taken for this visit.  Physical Exam Vitals and nursing note reviewed.   Constitutional:      Appearance: She is well-developed.  Pulmonary:     Effort: Pulmonary effort is normal.  Skin:    General: Skin is warm.     Capillary Refill: Capillary refill takes less than 2 seconds.  Neurological:     Mental Status: She is alert and oriented to person, place, and time.  Psychiatric:        Behavior: Behavior normal.        Thought Content: Thought content normal.        Judgment: Judgment normal.     Ortho Exam Examination of the right shoulder shows pain with impingement testing.  Positive Spurling sign.  Manual muscle testing of the rotator cuff is normal. Specialty Comments:  No specialty comments available.  Imaging: XR Shoulder Right  Result Date: 03/15/2022 Mild age-appropriate glenohumeral osteoarthritis.  No acute or structural abnormalities.    PMFS History: Patient Active Problem List   Diagnosis Date Noted   Status post total right knee replacement 10/18/2021   Primary osteoarthritis of right knee 10/17/2021   Spondylosis without myelopathy or radiculopathy, lumbar region 09/28/2021   Chronic right shoulder pain 08/18/2021   Adhesive capsulitis of right shoulder 08/18/2021   Gastroesophageal reflux disease 08/18/2021   Primary osteoarthritis of  left knee 07/27/2021   Primary osteoarthritis involving multiple joints 07/13/2021   Strain of right trapezius muscle 07/13/2021   Essential hypertension 05/19/2021   Hypertriglyceridemia 05/19/2021   Osteoporosis 05/19/2021   Lumbar radiculopathy 05/19/2021   Osteoarthritis 05/19/2021   Depression, major, single episode, in partial remission ([place]) 05/19/2021   Chronic bilateral low back pain 05/19/2021   Status post left foot surgery 04/23/2015   Injury of foot, left 04/08/2015   Sprain of ankle 04/08/2015   Deformity of metatarsal bone of left foot 04/08/2015   Metatarsalgia of left foot 02/10/2015   Tenosynovitis of foot 02/10/2015   Chronic pain of both knees 02/10/2015   Closed  fracture of metatarsal bone 10/21/2014   Edema of left foot 10/21/2014   Closed fracture of metatarsal bone(s) 01/30/2013   Pain, foot 01/30/2013   Abnormal foot finding 01/30/2013   Past Medical History:  Diagnosis Date   Anginal pain ([place])    pt says she feels like this may be related to acid reflux   Closed fracture of metatarsal bone(s) 01/30/2013   Depression    Essential hypertension    GERD (gastroesophageal reflux disease)    Lumbar radiculopathy    Osteoarthritis    Osteoporosis     Family History  Problem Relation Age of Onset   Colon cancer Neg Hx    Rectal cancer Neg Hx    Stomach cancer Neg Hx     Past Surgical History:  Procedure Laterality Date   ABDOMINAL HYSTERECTOMY     APPENDECTOMY     CATARACT EXTRACTION Bilateral 2022   Cotton Osteotomy w/ Bone Graft Left 04/16/2015   Left foot   IR RADIOLOGIST EVAL & MGMT  04/11/2017   OPEN REDUCTION INTERNAL FIXATION (ORIF) DISTAL PHALANX Left 10/23/2014   5th Metatarsal   TOTAL KNEE ARTHROPLASTY Right 10/18/2021   Procedure: RIGHT TOTAL KNEE ARTHROPLASTY;  Surgeon: Leandrew Koyanagi, MD;  Location: [facility];  Service: Orthopedics;  Laterality: Right;   Social History   Occupational History   Occupation: retired  Tobacco Use   Smoking status: Never   Smokeless tobacco: Never  Vaping Use   Vaping Use: Never used  Substance and Sexual Activity   Alcohol use: Not Currently   Drug use: Not Currently   Sexual activity: Not on file

## 2022-03-21 ENCOUNTER — Encounter: Payer: Self-pay | Admitting: Orthopaedic Surgery

## 2022-03-21 ENCOUNTER — Other Ambulatory Visit: Payer: Self-pay | Admitting: Physician Assistant

## 2022-03-21 ENCOUNTER — Telehealth: Payer: Self-pay | Admitting: Orthopaedic Surgery

## 2022-03-21 NOTE — Telephone Encounter (Signed)
Correct.  same way she took the first dose pack

## 2022-03-21 NOTE — Telephone Encounter (Signed)
Just responded to a message from betsy about this

## 2022-03-21 NOTE — Telephone Encounter (Signed)
Patient has questions about medication on her Prednisone rx it a refill. Pleas call [phone]

## 2022-03-22 NOTE — Telephone Encounter (Signed)
error 

## 2022-03-25 ENCOUNTER — Encounter: Payer: Self-pay | Admitting: Physical Medicine & Rehabilitation

## 2022-03-25 ENCOUNTER — Ambulatory Visit: Payer: Medicare Other | Admitting: Physical Medicine & Rehabilitation

## 2022-03-25 ENCOUNTER — Encounter: Payer: Medicare Other | Attending: Physical Medicine & Rehabilitation | Admitting: Physical Medicine & Rehabilitation

## 2022-03-25 VITALS — BP 136/79 | HR 92 | Temp 100.3°F | Ht 61.5 in | Wt 139.0 lb

## 2022-03-25 DIAGNOSIS — M47816 Spondylosis without myelopathy or radiculopathy, lumbar region: Secondary | ICD-10-CM | POA: Diagnosis not present

## 2022-03-25 MED ORDER — IOHEXOL 180 MG/ML  SOLN
3.0000 mL | Freq: Once | INTRAMUSCULAR | Status: AC
  Administered 2022-03-25: 3 mL

## 2022-03-25 MED ORDER — LIDOCAINE HCL (PF) 2 % IJ SOLN
4.0000 mL | Freq: Once | INTRAMUSCULAR | Status: AC
  Administered 2022-03-25: 4 mL

## 2022-03-25 MED ORDER — LIDOCAINE HCL 1 % IJ SOLN
10.0000 mL | Freq: Once | INTRAMUSCULAR | Status: AC
  Administered 2022-03-25: 10 mL

## 2022-03-25 NOTE — Progress Notes (Signed)
  PROCEDURE RECORD  Physical Medicine and Rehabilitation   Name: Amber Mccall DOB:October 01, 1950 MRN: [id number]  Date:03/25/2022  Physician: Alysia Penna, MD    Nurse/CMA: Jorja Loa MA  Allergies: No Known Allergies  Consent Signed: Yes.    Is patient diabetic? No.  CBG today? N/a  Pregnant: No. LMP: No LMP recorded. Patient has had a hysterectomy. (age [age])  Anticoagulants: no Anti-inflammatory: yes (Prednisone day #9) Antibiotics: no  Procedure: Right L1,2,3,4 Medial Branch Block  Position: Prone Start Time: 1:41 pm  End Time: 1:48 pm  Fluoro Time: 28  RN/CMA Arrin Pintor MA Simaya Lumadue MA    Time 1:25 pm 1:54 pm    BP 136/79 150/72    Pulse 92 96    Respirations 16 16    O2 Sat 95 96    S/S 6 6    Pain Level 7/10 5/10     D/C home with Son, patient A & O X 3, D/C instructions reviewed, and sits independently.

## 2022-03-25 NOTE — Progress Notes (Signed)
Right   L1, L2, L3, L4 medial branch blocks under fluoroscopic guidance  Indication: Right Lumbar pain which is not relieved by medication management or other conservative care and interfering with self-care and mobility.  Informed consent was obtained after describing risks and benefits of the procedure with the patient, this includes bleeding, bruising, infection, paralysis and medication side effects. The patient wishes to proceed and has given written consent. The patient was placed in a prone position. The lumbar area was marked and prepped with Betadine. One ML of 1% lidocaine was injected into each of 3 areas into the skin and subcutaneous tissue. hen a 22-gauge 3.5 spinal needle was inserted targeting the junction of the Right L4 superior articular process /transverse process junction. Needle was advanced under fluoroscopic guidance. Bone contact was made. omnipaque 180 was injected x0.5 mL demonstrating no intravascular uptake. Then a solution containing 2% MPF lidocaine was injected x0.5 mLThen a 22-gauge 3.5 spinal needle was inserted targeting the junction of the Right L3 superior articular process /transverse process junction. Needle was advanced under fluoroscopic guidance. Bone contact was made. omnipaque 180 was injected x0.5 mL demonstrating no intravascular uptake. Then a solution containing 2% MPF lidocaine was injected x0.5 mL. Then the Right L2 superior articular process in transverse process junction was targeted. Bone contact was made. Isovue 200 was injected x0.5 mL demonstrating no intravascular uptake. Then a solution containing  2% MPF lidocaine was injected x0.5 mL. Then the Right L5 superior articular process in transverse process junction was targeted. Bone contact was made. Isovue 200  was injected x0.5 mL demonstrating no intravascular uptake. Then a solution containing  2% MPF lidocaine was injected x0.5 mL. Patient tolerated procedure well. Post procedure instructions were  given. Please refer to post procedure form.

## 2022-03-25 NOTE — Patient Instructions (Addendum)
Right L1,2,3,4Lumbar medial branch blocks were performed. This is to help diagnose the cause of the low back pain. It is important that you keep track of your pain for the first day or 2 after injection. This injection can give you temporary relief that lasts for hours or up to several months. There is no way to predict duration of pain relief.  Please try to compare your pain after injection to for the injection.  If this injection gives you  temporary relief there may be another longer-lasting procedure that may be beneficial call radiofrequency ablation

## 2022-04-05 NOTE — Telephone Encounter (Signed)
Patient's friend, Bonnita Nasuti called to get an update on the patient's Prolia shot. Can she be scheduled? Please advise.

## 2022-04-05 NOTE — Telephone Encounter (Signed)
Is there a way to see how much it is bill and buy? So that I can offer that to the pt as well?

## 2022-04-06 ENCOUNTER — Other Ambulatory Visit: Payer: Self-pay

## 2022-04-06 ENCOUNTER — Other Ambulatory Visit (HOSPITAL_BASED_OUTPATIENT_CLINIC_OR_DEPARTMENT_OTHER): Payer: Self-pay

## 2022-04-06 DIAGNOSIS — M810 Age-related osteoporosis without current pathological fracture: Secondary | ICD-10-CM

## 2022-04-06 MED ORDER — DENOSUMAB 60 MG/ML ~~LOC~~ SOSY
60.0000 mg | PREFILLED_SYRINGE | Freq: Once | SUBCUTANEOUS | 0 refills | Status: AC
  Filled 2022-04-06: qty 1, 180d supply, fill #0

## 2022-04-06 NOTE — Telephone Encounter (Signed)
Pt ready for scheduling   Out-of-pocket cost due at time of visit: $301  Primary: [employer] Medicare Prolia co-insurance: 20% (approximately $276) Admin fee co-insurance: 20% (approximately $25)  Secondary:  Prolia co-insurance:  Admin fee co-insurance:   Deductible:   Prior Auth:  PA# [id number]  Valid: 08/11/21 - 08/12/22  ** This summary of benefits is an estimation of the patient's out-of-pocket cost. Exact cost may vary based on individual plan coverage.

## 2022-04-06 NOTE — Telephone Encounter (Signed)
Pt is scheduled- Rx sent to [facility] so that she can receive for $0

## 2022-04-07 ENCOUNTER — Other Ambulatory Visit (HOSPITAL_BASED_OUTPATIENT_CLINIC_OR_DEPARTMENT_OTHER): Payer: Self-pay

## 2022-04-12 ENCOUNTER — Ambulatory Visit (INDEPENDENT_AMBULATORY_CARE_PROVIDER_SITE_OTHER): Payer: Medicare Other | Admitting: Orthopaedic Surgery

## 2022-04-12 ENCOUNTER — Encounter: Payer: Self-pay | Admitting: Orthopaedic Surgery

## 2022-04-12 ENCOUNTER — Ambulatory Visit (INDEPENDENT_AMBULATORY_CARE_PROVIDER_SITE_OTHER): Payer: Medicare Other

## 2022-04-12 DIAGNOSIS — S46811A Strain of other muscles, fascia and tendons at shoulder and upper arm level, right arm, initial encounter: Secondary | ICD-10-CM | POA: Diagnosis not present

## 2022-04-12 DIAGNOSIS — Z96651 Presence of right artificial knee joint: Secondary | ICD-10-CM | POA: Diagnosis not present

## 2022-04-12 NOTE — Progress Notes (Signed)
Office Visit Note   Patient: Amber Mccall           Date of Birth: Mar 16, 1951           MRN: [id number] Visit Date: 04/12/2022              Requested by: Shelda Pal, [address] [place],  [place] [address] PCP: Shelda Pal, DO   Assessment & Plan: Visit Diagnoses:  1. Hx of total knee replacement, right   2. Strain of right trapezius muscle, initial encounter     Plan: In regards to the right knee pain impression is contusion.  Recommend symptomatic treatment.  Prophylaxis reinforced.  Recheck in 6 months with two-view x-rays of the right knee.  For the trapezius myofascial pain will refer to physical therapy for dry needling.  Interpreter present today.  Follow-Up Instructions: Return in about 6 months (around 10/12/2022).   Orders:  Orders Placed This Encounter  Procedures   XR Knee 1-2 Views Right   Ambulatory referral to Physical Therapy   No orders of the defined types were placed in this encounter.     Procedures: No procedures performed   Clinical Data: No additional findings.   Subjective: Chief Complaint  Patient presents with   Right Knee - Follow-up    Right total knee arthroplasty 10/18/2021    HPI Patient is approximately 6 months status post right total knee replacement on 10/18/2021.  She fell in her bathroom and hit her knee about 2 weeks ago and she has pain above the patella.  Overall this pain has improved.  She is also complaining of muscular pain in the right trapezius muscle.  Interpreter present today.  Review of Systems  Constitutional: Negative.   HENT: Negative.    Eyes: Negative.   Respiratory: Negative.    Cardiovascular: Negative.   Endocrine: Negative.   Musculoskeletal: Negative.   Neurological: Negative.   Hematological: Negative.   Psychiatric/Behavioral: Negative.    All other systems reviewed and are negative.    Objective: Vital Signs: There were no vitals taken for this  visit.  Physical Exam Vitals and nursing note reviewed.  Constitutional:      Appearance: She is well-developed.  HENT:     Head: Normocephalic and atraumatic.  Pulmonary:     Effort: Pulmonary effort is normal.  Abdominal:     Palpations: Abdomen is soft.  Musculoskeletal:     Cervical back: Neck supple.  Skin:    General: Skin is warm.     Capillary Refill: Capillary refill takes less than 2 seconds.  Neurological:     Mental Status: She is alert and oriented to person, place, and time.  Psychiatric:        Behavior: Behavior normal.        Thought Content: Thought content normal.        Judgment: Judgment normal.     Ortho Exam Examination of the right knee shows a fully healed surgical scar.  She has little bit of tenderness to the superior portion of the scar.  She has excellent range of motion with good varus valgus stability.  She has no joint effusion.  She has excellent knee extension strength.  Examination of the right shoulder girdle shows diffuse tenderness to the trapezius muscle.  Exam otherwise unremarkable. Specialty Comments:  No specialty comments available.  Imaging: XR Knee 1-2 Views Right  Result Date: 04/12/2022 Stable right total knee replacement in good  alignment without complication    PMFS History: Patient Active Problem List   Diagnosis Date Noted   Status post total right knee replacement 10/18/2021   Primary osteoarthritis of right knee 10/17/2021   Spondylosis without myelopathy or radiculopathy, lumbar region 09/28/2021   Chronic right shoulder pain 08/18/2021   Adhesive capsulitis of right shoulder 08/18/2021   Gastroesophageal reflux disease 08/18/2021   Primary osteoarthritis of left knee 07/27/2021   Primary osteoarthritis involving multiple joints 07/13/2021   Strain of right trapezius muscle 07/13/2021   Essential hypertension 05/19/2021   Hypertriglyceridemia 05/19/2021   Osteoporosis 05/19/2021   Lumbar radiculopathy  05/19/2021   Osteoarthritis 05/19/2021   Depression, major, single episode, in partial remission ([place]) 05/19/2021   Chronic bilateral low back pain 05/19/2021   Status post left foot surgery 04/23/2015   Injury of foot, left 04/08/2015   Sprain of ankle 04/08/2015   Deformity of metatarsal bone of left foot 04/08/2015   Metatarsalgia of left foot 02/10/2015   Tenosynovitis of foot 02/10/2015   Chronic pain of both knees 02/10/2015   Closed fracture of metatarsal bone 10/21/2014   Edema of left foot 10/21/2014   Closed fracture of metatarsal bone(s) 01/30/2013   Pain, foot 01/30/2013   Abnormal foot finding 01/30/2013   Past Medical History:  Diagnosis Date   Anginal pain ([place])    pt says she feels like this may be related to acid reflux   Closed fracture of metatarsal bone(s) 01/30/2013   Depression    Essential hypertension    GERD (gastroesophageal reflux disease)    Lumbar radiculopathy    Osteoarthritis    Osteoporosis     Family History  Problem Relation Age of Onset   Colon cancer Neg Hx    Rectal cancer Neg Hx    Stomach cancer Neg Hx     Past Surgical History:  Procedure Laterality Date   ABDOMINAL HYSTERECTOMY     APPENDECTOMY     CATARACT EXTRACTION Bilateral 2022   Cotton Osteotomy w/ Bone Graft Left 04/16/2015   Left foot   IR RADIOLOGIST EVAL & MGMT  04/11/2017   OPEN REDUCTION INTERNAL FIXATION (ORIF) DISTAL PHALANX Left 10/23/2014   5th Metatarsal   TOTAL KNEE ARTHROPLASTY Right 10/18/2021   Procedure: RIGHT TOTAL KNEE ARTHROPLASTY;  Surgeon: Leandrew Koyanagi, MD;  Location: [facility];  Service: Orthopedics;  Laterality: Right;   Social History   Occupational History   Occupation: retired  Tobacco Use   Smoking status: Never   Smokeless tobacco: Never  Vaping Use   Vaping Use: Never used  Substance and Sexual Activity   Alcohol use: Not Currently   Drug use: Not Currently   Sexual activity: Not on file

## 2022-04-13 ENCOUNTER — Ambulatory Visit (INDEPENDENT_AMBULATORY_CARE_PROVIDER_SITE_OTHER): Payer: Medicare Other

## 2022-04-13 DIAGNOSIS — M810 Age-related osteoporosis without current pathological fracture: Secondary | ICD-10-CM | POA: Diagnosis not present

## 2022-04-13 MED ORDER — DENOSUMAB 60 MG/ML ~~LOC~~ SOSY
60.0000 mg | PREFILLED_SYRINGE | Freq: Once | SUBCUTANEOUS | Status: AC
  Administered 2022-04-13: 60 mg via SUBCUTANEOUS

## 2022-04-13 NOTE — Progress Notes (Signed)
Pt here for Prolia shot. Pt has provided the Prolia.  Prolia given the left arm subcutaneously. Pt handled well.

## 2022-04-27 ENCOUNTER — Telehealth: Payer: Self-pay | Admitting: *Deleted

## 2022-04-27 NOTE — Telephone Encounter (Signed)
Patient's son called me and stated his mother was ready to have surgery for her other knee (left); he reports she was here several weeks ago. I told him I would pass this along, but don't see anything in notes at last visit about left knee. Does she need an appointment to discuss or was a surgery sheet completed on her already? Thanks.

## 2022-04-28 NOTE — Telephone Encounter (Signed)
I will fill out sheet

## 2022-05-03 NOTE — Therapy (Signed)
OUTPATIENT PHYSICAL THERAPY SHOULDER EVALUATION   Patient Name: Amber Mccall MRN: [id number] DOB:1950/05/30, 72 y.o., female Today's Date: 05/03/2022  END OF SESSION:   Past Medical History:  Diagnosis Date   Anginal pain ([place])    pt says she feels like this may be related to acid reflux   Closed fracture of metatarsal bone(s) 01/30/2013   Depression    Essential hypertension    GERD (gastroesophageal reflux disease)    Lumbar radiculopathy    Osteoarthritis    Osteoporosis    Past Surgical History:  Procedure Laterality Date   ABDOMINAL HYSTERECTOMY     APPENDECTOMY     CATARACT EXTRACTION Bilateral 2022   Cotton Osteotomy w/ Bone Graft Left 04/16/2015   Left foot   IR RADIOLOGIST EVAL & MGMT  04/11/2017   OPEN REDUCTION INTERNAL FIXATION (ORIF) DISTAL PHALANX Left 10/23/2014   5th Metatarsal   TOTAL KNEE ARTHROPLASTY Right 10/18/2021   Procedure: RIGHT TOTAL KNEE ARTHROPLASTY;  Surgeon: Leandrew Koyanagi, MD;  Location: [facility];  Service: Orthopedics;  Laterality: Right;   Patient Active Problem List   Diagnosis Date Noted   Status post total right knee replacement 10/18/2021   Primary osteoarthritis of right knee 10/17/2021   Spondylosis without myelopathy or radiculopathy, lumbar region 09/28/2021   Chronic right shoulder pain 08/18/2021   Adhesive capsulitis of right shoulder 08/18/2021   Gastroesophageal reflux disease 08/18/2021   Primary osteoarthritis of left knee 07/27/2021   Primary osteoarthritis involving multiple joints 07/13/2021   Strain of right trapezius muscle 07/13/2021   Essential hypertension 05/19/2021   Hypertriglyceridemia 05/19/2021   Osteoporosis 05/19/2021   Lumbar radiculopathy 05/19/2021   Osteoarthritis 05/19/2021   Depression, major, single episode, in partial remission ([place]) 05/19/2021   Chronic bilateral low back pain 05/19/2021   Status post left foot surgery 04/23/2015   Injury of foot, left 04/08/2015   Sprain of ankle 04/08/2015    Deformity of metatarsal bone of left foot 04/08/2015   Metatarsalgia of left foot 02/10/2015   Tenosynovitis of foot 02/10/2015   Chronic pain of both knees 02/10/2015   Closed fracture of metatarsal bone 10/21/2014   Edema of left foot 10/21/2014   Closed fracture of metatarsal bone(s) 01/30/2013   Pain, foot 01/30/2013   Abnormal foot finding 01/30/2013    PCP: Shelda Pal, DO   REFERRING PROVIDER: Leandrew Koyanagi, MD   REFERRING DIAG: [phone] (ICD-10-CM) - Strain of right trapezius muscle, initial encounter   THERAPY DIAG:  No diagnosis found.  Rationale for Evaluation and Treatment: Rehabilitation  ONSET DATE: ***  SUBJECTIVE:  SUBJECTIVE STATEMENT: ***  PERTINENT HISTORY: ***  PAIN:  Are you having pain? Yes: NPRS scale: ***/10 Pain location: *** Pain description: *** Aggravating factors: *** Relieving factors: ***  PRECAUTIONS: {Therapy precautions:24002}  WEIGHT BEARING RESTRICTIONS: {Yes ***/No:24003}  FALLS:  Has patient fallen in last 6 months? {fallsyesno:27318}  LIVING ENVIRONMENT: Lives with: {[facility] lives with:25569::"lives with their family"} Lives in: {Lives in:25570} Stairs: {opstairs:27293} Has following equipment at home: {Assistive devices:23999}  OCCUPATION: ***  PLOF: {PLOF:24004}  PATIENT GOALS:***  NEXT MD VISIT:   OBJECTIVE:   DIAGNOSTIC FINDINGS:  Rt shoulder X-ray:  Mild age-appropriate glenohumeral osteoarthritis.  No acute or structural  abnormalities.   PATIENT SURVEYS:  N/A language   COGNITION: Overall cognitive status: {cognition:24006}     SENSATION: {sensation:27233}  POSTURE: ***  CERVICAL ROM:   Active ROM A/PROM (deg) eval  Flexion   Extension   Right lateral flexion   Left lateral flexion   Right  rotation   Left rotation    (Blank rows = not tested)  UPPER EXTREMITY ROM:   Active ROM Right eval Left eval  Shoulder flexion    Shoulder extension    Shoulder abduction    Shoulder adduction    Shoulder internal rotation    Shoulder external rotation    Elbow flexion    Elbow extension    Wrist flexion    Wrist extension    Wrist ulnar deviation    Wrist radial deviation    Wrist pronation    Wrist supination    (Blank rows = not tested)  UPPER EXTREMITY MMT:  MMT Right eval Left eval  Shoulder flexion    Shoulder extension    Shoulder abduction    Shoulder adduction    Shoulder internal rotation    Shoulder external rotation    Middle trapezius    Lower trapezius    Elbow flexion    Elbow extension    Wrist flexion    Wrist extension    Wrist ulnar deviation    Wrist radial deviation    Wrist pronation    Wrist supination    Grip strength (lbs)    (Blank rows = not tested)  SHOULDER SPECIAL TESTS: Impingement tests: {shoulder impingement test:25231:a} SLAP lesions: {SLAP lesions:25232} Instability tests: {shoulder instability test:25233} Rotator cuff assessment: {rotator cuff assessment:25234} Biceps assessment: {biceps assessment:25235}  JOINT MOBILITY TESTING:  ***  PALPATION:  ***   [facility] Adult PT Treatment:                                                DATE: 05/04/22 Therapeutic Exercise: *** Manual Therapy: *** Neuromuscular re-ed: *** Therapeutic Activity: *** Modalities: *** Self Care: ***   PATIENT EDUCATION: Education details: see treatment  Person educated: Patient Education method: Explanation, Demonstration, Tactile cues, Verbal cues, and Handouts Education comprehension: verbalized understanding, returned demonstration, verbal cues required, tactile cues required, and needs further education  HOME EXERCISE PROGRAM: ***  ASSESSMENT:  CLINICAL IMPRESSION: Patient is a 72 y.o. female who was seen today for physical  therapy evaluation and treatment for ***.   OBJECTIVE IMPAIRMENTS: {opptimpairments:25111}.   ACTIVITY LIMITATIONS: {activitylimitations:27494}  PARTICIPATION LIMITATIONS: {participationrestrictions:25113}  PERSONAL FACTORS: {Personal factors:25162} are also affecting patient's functional outcome.   REHAB POTENTIAL: {rehabpotential:25112}  CLINICAL DECISION MAKING: {clinical decision making:25114}  EVALUATION COMPLEXITY: {Evaluation complexity:25115}   GOALS: Goals reviewed with patient? {  yes/no:20286}  SHORT TERM GOALS: Target date: ***  *** Baseline: Goal status: {GOALSTATUS:25110}  2.  *** Baseline:  Goal status: {GOALSTATUS:25110}  3.  *** Baseline:  Goal status: {GOALSTATUS:25110}  4.  *** Baseline:  Goal status: {GOALSTATUS:25110}  5.  *** Baseline:  Goal status: {GOALSTATUS:25110}  6.  *** Baseline:  Goal status: {GOALSTATUS:25110}  LONG TERM GOALS: Target date: ***  *** Baseline:  Goal status: {GOALSTATUS:25110}  2.  *** Baseline:  Goal status: {GOALSTATUS:25110}  3.  *** Baseline:  Goal status: {GOALSTATUS:25110}  4.  *** Baseline:  Goal status: {GOALSTATUS:25110}  5.  *** Baseline:  Goal status: {GOALSTATUS:25110}  6.  *** Baseline:  Goal status: {GOALSTATUS:25110}  PLAN:  PT FREQUENCY: {rehab frequency:25116}  PT DURATION: {rehab duration:25117}  PLANNED INTERVENTIONS: {rehab planned interventions:25118::"Therapeutic exercises","Therapeutic activity","Neuromuscular re-education","Balance training","Gait training","Patient/Family education","Self Care","Joint mobilization"}  PLAN FOR NEXT SESSION: ***  Gwendolyn Grant, PT, DPT, ATC 05/03/22 3:53 PM

## 2022-05-04 ENCOUNTER — Ambulatory Visit: Payer: 59 | Attending: Orthopaedic Surgery

## 2022-05-04 ENCOUNTER — Other Ambulatory Visit: Payer: Self-pay

## 2022-05-04 DIAGNOSIS — G8929 Other chronic pain: Secondary | ICD-10-CM

## 2022-05-04 DIAGNOSIS — M25511 Pain in right shoulder: Secondary | ICD-10-CM | POA: Diagnosis not present

## 2022-05-04 DIAGNOSIS — R293 Abnormal posture: Secondary | ICD-10-CM | POA: Diagnosis not present

## 2022-05-04 DIAGNOSIS — M542 Cervicalgia: Secondary | ICD-10-CM | POA: Insufficient documentation

## 2022-05-04 DIAGNOSIS — S46811A Strain of other muscles, fascia and tendons at shoulder and upper arm level, right arm, initial encounter: Secondary | ICD-10-CM | POA: Diagnosis not present

## 2022-05-04 DIAGNOSIS — M6281 Muscle weakness (generalized): Secondary | ICD-10-CM | POA: Diagnosis not present

## 2022-05-04 NOTE — Patient Instructions (Signed)

## 2022-05-06 ENCOUNTER — Ambulatory Visit: Payer: Medicare Other | Admitting: Physical Medicine & Rehabilitation

## 2022-05-10 ENCOUNTER — Telehealth: Payer: Self-pay

## 2022-05-10 ENCOUNTER — Ambulatory Visit (INDEPENDENT_AMBULATORY_CARE_PROVIDER_SITE_OTHER): Payer: Medicare Other

## 2022-05-10 ENCOUNTER — Encounter: Payer: Self-pay | Admitting: Physical Medicine & Rehabilitation

## 2022-05-10 ENCOUNTER — Encounter: Payer: 59 | Attending: Physical Medicine & Rehabilitation | Admitting: Physical Medicine & Rehabilitation

## 2022-05-10 VITALS — BP 132/72 | HR 68 | Ht 61.5 in | Wt 142.0 lb

## 2022-05-10 DIAGNOSIS — M1712 Unilateral primary osteoarthritis, left knee: Secondary | ICD-10-CM

## 2022-05-10 DIAGNOSIS — G8929 Other chronic pain: Secondary | ICD-10-CM | POA: Diagnosis not present

## 2022-05-10 DIAGNOSIS — M545 Low back pain, unspecified: Secondary | ICD-10-CM | POA: Diagnosis not present

## 2022-05-10 MED ORDER — TRAMADOL HCL 50 MG PO TABS: 50.0000 mg | ORAL_TABLET | Freq: Four times a day (QID) | ORAL | 0 refills | Status: DC | PRN

## 2022-05-10 NOTE — Patient Instructions (Signed)
Consider an appointment with Dr Ernestina Patches - for spine injection, or Dr Laurance Flatten spine surgeon to evaluate your back pain after you recover from knee surgery

## 2022-05-10 NOTE — Progress Notes (Signed)
Subjective:    Patient ID: Amber Mccall, female    DOB: Mar 22, 1951, 73 y.o.   MRN: [id number] [place] interpreter services for the visit HPI  72 year old female with history of prior left L4-5 lumbar laminectomy who has had chronic pain right-sided low back upper lumbar area.  Repeat MRI in April 2023 demonstrated a chronic T12 compression fracture status post vertebral augmentation procedure.  The patient's pain is in the right paravertebral area in the L1-L2 area approximately 5 cm lateral to the midline.  She has had no sciatic type discomfort. Right L1,2,3,4 MBB 03/26/23 less than 50% pain relief  Right L2,3,4,5 02/08/22 less than 50% pain relief  The patient is scheduled for right total knee replacement by Dr. Erlinda Hong June 17, 2022 which  The patient states that she has tried physical therapy in the past without much improvement Pain Inventory Average Pain 6 Pain Right Now 7 My pain is intermittent, constant, and ripping  In the last 24 hours, has pain interfered with the following? General activity 9 Relation with others 0 Enjoyment of life 3 What TIME of day is your pain at its worst? evening and night Sleep (in general) Poor  Pain is worse with:  twisting & turning Pain improves with: rest and medication Relief from Meds: 7  Family History  Problem Relation Age of Onset   Colon cancer Neg Hx    Rectal cancer Neg Hx    Stomach cancer Neg Hx    Social History   Socioeconomic History   Marital status: Widowed    Spouse name: Not on file   Number of children: 3   Years of education: Not on file   Highest education level: Not on file  Occupational History   Occupation: retired  Tobacco Use   Smoking status: Never   Smokeless tobacco: Never  Vaping Use   Vaping Use: Never used  Substance and Sexual Activity   Alcohol use: Not Currently   Drug use: Not Currently   Sexual activity: Not on file  Other Topics Concern   Not on file  Social History  Narrative   Not on file   Social Determinants of Health   Financial Resource Strain: Not on file  Food Insecurity: Not on file  Transportation Needs: Not on file  Physical Activity: Not on file  Stress: Not on file  Social Connections: Not on file   Past Surgical History:  Procedure Laterality Date   ABDOMINAL HYSTERECTOMY     APPENDECTOMY     CATARACT EXTRACTION Bilateral 2022   Cotton Osteotomy w/ Bone Graft Left 04/16/2015   Left foot   IR RADIOLOGIST EVAL & MGMT  04/11/2017   OPEN REDUCTION INTERNAL FIXATION (ORIF) DISTAL PHALANX Left 10/23/2014   5th Metatarsal   TOTAL KNEE ARTHROPLASTY Right 10/18/2021   Procedure: RIGHT TOTAL KNEE ARTHROPLASTY;  Surgeon: Leandrew Koyanagi, MD;  Location: [facility];  Service: Orthopedics;  Laterality: Right;   Past Surgical History:  Procedure Laterality Date   ABDOMINAL HYSTERECTOMY     APPENDECTOMY     CATARACT EXTRACTION Bilateral 2022   Cotton Osteotomy w/ Bone Graft Left 04/16/2015   Left foot   IR RADIOLOGIST EVAL & MGMT  04/11/2017   OPEN REDUCTION INTERNAL FIXATION (ORIF) DISTAL PHALANX Left 10/23/2014   5th Metatarsal   TOTAL KNEE ARTHROPLASTY Right 10/18/2021   Procedure: RIGHT TOTAL KNEE ARTHROPLASTY;  Surgeon: Leandrew Koyanagi, MD;  Location: [facility];  Service: Orthopedics;  Laterality: Right;  Past Medical History:  Diagnosis Date   Anginal pain ([place])    pt says she feels like this may be related to acid reflux   Closed fracture of metatarsal bone(s) 01/30/2013   Depression    Essential hypertension    GERD (gastroesophageal reflux disease)    Lumbar radiculopathy    Osteoarthritis    Osteoporosis    There were no vitals taken for this visit.  Opioid Risk Score:   Fall Risk Score:  `1  Depression screen PHQ 2/9     02/08/2022    3:47 PM 07/13/2021    2:28 PM 05/19/2021    1:40 PM  Depression screen PHQ 2/9  Decreased Interest 0 0 0  Down, Depressed, Hopeless 0 0 0  PHQ - 2 Score 0 0 0    Review of Systems   Musculoskeletal:  Positive for back pain.       Right side of the back pain, pain in both legs  All other systems reviewed and are negative.      Objective:   Physical Exam  Well-developed well-nourished female no acute distress Mood and affect are appropriate There is mild tenderness palpation in the lumbar paraspinal area The patient has normal lumbar spine range of motion. Ambulates without assist device no evidence of toe drag and instability Negative straight leg raising bilaterally Normal strength in the lower extremities    Assessment & Plan:  1.  Chronic right-sided upper lumbar pain this could be related to prior compression fracture although it has received vertebral augmentation procedure.  Do not think any lumbar facet procedures would be helpful in this regard.  May consider thoracic facet injections which are not done at this office.  Gave her the name of Dr. Ernestina Patches.  She may contact after she recovers from her total knee replacement.  She may benefit from additional spine surgery evaluation, [facility]. The patient requested a pain medication we prescribed tramadol 50 mg p.o. every 6 hours as needed #90.  Postoperatively she will get likely stronger pain medications from Dr. Erlinda Hong.  Follow-up with physical medicine rehab clinic as needed

## 2022-05-10 NOTE — Telephone Encounter (Signed)
PA submitted for Tramadol 

## 2022-05-11 ENCOUNTER — Ambulatory Visit: Payer: 59

## 2022-05-11 DIAGNOSIS — M542 Cervicalgia: Secondary | ICD-10-CM

## 2022-05-11 DIAGNOSIS — R293 Abnormal posture: Secondary | ICD-10-CM | POA: Diagnosis not present

## 2022-05-11 DIAGNOSIS — M6281 Muscle weakness (generalized): Secondary | ICD-10-CM

## 2022-05-11 DIAGNOSIS — M25511 Pain in right shoulder: Secondary | ICD-10-CM | POA: Diagnosis not present

## 2022-05-11 DIAGNOSIS — G8929 Other chronic pain: Secondary | ICD-10-CM

## 2022-05-11 DIAGNOSIS — S46811A Strain of other muscles, fascia and tendons at shoulder and upper arm level, right arm, initial encounter: Secondary | ICD-10-CM | POA: Diagnosis not present

## 2022-05-11 NOTE — Progress Notes (Signed)
Left knee x-rays

## 2022-05-11 NOTE — Therapy (Signed)
OUTPATIENT PHYSICAL THERAPY TREATMENT NOTE   Patient Name: Amber Mccall MRN: [id number] DOB:08-07-1950, 72 y.o., female Today's Date: 05/11/2022  PCP: Shelda Pal, DO  REFERRING PROVIDER: Leandrew Koyanagi, MD    END OF SESSION:   PT End of Session - 05/11/22 0930     Visit Number 2    Number of Visits 13    Date for PT Re-Evaluation 06/18/22    Authorization Type MCR/MCD    PT Start Time 0930    PT Stop Time 1012    PT Time Calculation (min) 42 min    Activity Tolerance Patient tolerated treatment well    Behavior During Therapy [facility] for tasks assessed/performed             Past Medical History:  Diagnosis Date   Anginal pain ([place])    pt says she feels like this may be related to acid reflux   Closed fracture of metatarsal bone(s) 01/30/2013   Depression    Essential hypertension    GERD (gastroesophageal reflux disease)    Lumbar radiculopathy    Osteoarthritis    Osteoporosis    Past Surgical History:  Procedure Laterality Date   ABDOMINAL HYSTERECTOMY     APPENDECTOMY     CATARACT EXTRACTION Bilateral 2022   [place] Osteotomy w/ Bone Graft Left 04/16/2015   Left foot   IR RADIOLOGIST EVAL & MGMT  04/11/2017   OPEN REDUCTION INTERNAL FIXATION (ORIF) DISTAL PHALANX Left 10/23/2014   5th Metatarsal   TOTAL KNEE ARTHROPLASTY Right 10/18/2021   Procedure: RIGHT TOTAL KNEE ARTHROPLASTY;  Surgeon: Leandrew Koyanagi, MD;  Location: [place];  Service: Orthopedics;  Laterality: Right;   Patient Active Problem List   Diagnosis Date Noted   Status post total right knee replacement 10/18/2021   Primary osteoarthritis of right knee 10/17/2021   Spondylosis without myelopathy or radiculopathy, lumbar region 09/28/2021   Chronic right shoulder pain 08/18/2021   Adhesive capsulitis of right shoulder 08/18/2021   Gastroesophageal reflux disease 08/18/2021   Primary osteoarthritis of left knee 07/27/2021   Primary osteoarthritis involving multiple joints 07/13/2021    Strain of right trapezius muscle 07/13/2021   Essential hypertension 05/19/2021   Hypertriglyceridemia 05/19/2021   Osteoporosis 05/19/2021   Lumbar radiculopathy 05/19/2021   Osteoarthritis 05/19/2021   Depression, major, single episode, in partial remission ([place]) 05/19/2021   Chronic right-sided low back pain 05/19/2021   Status post left foot surgery 04/23/2015   Injury of foot, left 04/08/2015   Sprain of ankle 04/08/2015   Deformity of metatarsal bone of left foot 04/08/2015   Metatarsalgia of left foot 02/10/2015   Tenosynovitis of foot 02/10/2015   Chronic pain of both knees 02/10/2015   Closed fracture of metatarsal bone 10/21/2014   Edema of left foot 10/21/2014   Closed fracture of metatarsal bone(s) 01/30/2013   Pain, foot 01/30/2013   Abnormal foot finding 01/30/2013    REFERRING DIAG: I69.629B (ICD-10-CM) - Strain of right trapezius muscle, initial encounter    THERAPY DIAG:  Chronic right shoulder pain  Cervicalgia  Muscle weakness (generalized)  Abnormal posture  Rationale for Evaluation and Treatment Rehabilitation  PERTINENT HISTORY: anginal pain, osteoporosis   PRECAUTIONS: none   SUBJECTIVE:  Video interpreter utilized   SUBJECTIVE STATEMENT:  " I am in a lot of pain." She has not been completing HEP.    PAIN:  Are you having pain? Yes: NPRS scale: 8/10 Pain location: Rt upper trap Pain description: constant; pulling; stabbing  Aggravating factors: lifting;sleeping on the Rt side Relieving factors: massage gun   OBJECTIVE: (objective measures completed at initial evaluation unless otherwise dated)  DIAGNOSTIC FINDINGS:  Rt shoulder X-ray:   Mild age-appropriate glenohumeral osteoarthritis.  No acute or structural  abnormalities.    PATIENT SURVEYS:  N/A  language    COGNITION: Overall cognitive status: Within functional limits for tasks assessed                                  SENSATION: Not tested   POSTURE: Forward head, rounded shoulders    CERVICAL ROM:    Active ROM A/PROM (deg) eval  Flexion WNL  Extension WNL  Right lateral flexion WNL  Left lateral flexion WNL  Right rotation WNL pn  Left rotation WNL pn   (Blank rows = not tested)   UPPER EXTREMITY ROM:    Active ROM Right eval Left eval  Shoulder flexion 145    Shoulder extension      Shoulder abduction WNL     Shoulder adduction      Shoulder internal rotation L1 T6  Shoulder external rotation Top of head T2  Elbow flexion      Elbow extension      Wrist flexion      Wrist extension      Wrist ulnar deviation      Wrist radial deviation      Wrist pronation      Wrist supination      (Blank rows = not tested) pain about Rt upper trap with all Rt shoulder AROM   UPPER EXTREMITY MMT:   MMT Right eval Left eval  Shoulder flexion 4; pn shoulder shrug 4+  Shoulder extension      Shoulder abduction 4- pn; shoulder shrug 4+  Shoulder adduction      Shoulder internal rotation 5 5  Shoulder external rotation 5 5  Middle trapezius      Lower trapezius      Elbow flexion      Elbow extension      Wrist flexion      Wrist extension      Wrist ulnar deviation      Wrist radial deviation      Wrist pronation      Wrist supination      Grip strength (lbs)      (Blank rows = not tested)   SPECIAL TESTS: Cervical Compression and distraction (-)    PALPATION:  TTP and trigger points present Rt upper trap and levator scapulae      [facility] Adult PT Treatment:                                                DATE: 05/11/22 Therapeutic Exercise: UBE level 1.5 x 2 min each fwd/bwd Scapular retraction 2 x 10  Upper trap stretch 2 x 30 sec  Levator scap stretch 2 x 30 sec  Pec doorway stretch 2 x 30 sec  Cervical retraction 2 x 10  Manual  Therapy: STM/DTM Rt upper trap, levator scapulae Passive upper trap stretch  Trigger Point Dry Needling Treatment: Pre-treatment instruction: Patient instructed on dry needling rationale, procedures, and possible side effects including pain during treatment (achy,cramping feeling), bruising, drop of blood, lightheadedness, nausea, sweating. Patient Consent Given: Yes Education handout provided: Previously provided Muscles treated: Rt upper trap   Needle size and number: .30x20m x 1 Treatment response/outcome: Twitch response elicited and Palpable decrease in muscle tension Post-treatment instructions: Patient instructed to expect possible mild to moderate muscle soreness later today and/or tomorrow. Patient instructed in methods to reduce muscle soreness and to continue prescribed HEP. If patient was dry needled over the lung field, patient was instructed on signs and symptoms of pneumothorax and, however unlikely, to see immediate medical attention should they occur. Patient was also educated on signs and symptoms of infection and to seek medical attention should they occur. Patient verbalized understanding of these instructions and education.   [facility]                                                DATE: 05/04/22 Therapeutic Exercise: Demonstrated and issue initial HEP.    Therapeutic Activity: Education on assessment findings that will be addressed throughout duration of POC.    Self Care: Posture education      PATIENT EDUCATION: Education details: HEP review; TPDN Person educated: Patient Education method: Explanation, Demonstration, Tactile cues, Verbal cues Education comprehension: verbalized understanding, returned demonstration, verbal cues required, tactile cues required, and needs further education   HOME EXERCISE PROGRAM: Access Code: [email or web address]: [email or web address] Date: 05/04/2022 Prepared by: SGwendolyn Grant  Exercises - Seated  Scapular Retraction  - 2 x daily - 7 x weekly - 2 sets - 10 reps - 5 sec  hold - Seated Upper Trapezius Stretch  - 2 x daily - 7 x weekly - 3 sets - 30 sec  hold - Seated Levator Scapulae Stretch  - 2 x daily - 7 x weekly - 3 sets - 30 sec  hold - Doorway Pec Stretch at 60 Degrees Abduction with Arm Straight  - 2 x daily - 7 x weekly - 3 sets - 30 sec  hold   ASSESSMENT:   CLINICAL IMPRESSION: Patient arrives with high pain level about the Rt upper trap, but in NAD. TPDN was performed to the Rt upper trap with excellent twitch response and a reduction in tautness noted. Reviewed exercises as part of her HEP as patient admits to not completing her home program that was issued at initial evaluation. She has difficulty initially performing scapular retraction as she has tendency to shrug her shoulders. With continued reps and cueing she is able to properly perform. No changes made to HEP at this time with patient encouraged to complete initially prescribed exercises to assist in improving her function with patient verbalizing understanding. No change in pain reported at conclusion of session.    OBJECTIVE IMPAIRMENTS: decreased activity tolerance, decreased knowledge of condition, decreased ROM, decreased strength, increased fascial restrictions, impaired flexibility, impaired UE functional use, improper body mechanics, postural dysfunction, and pain.    ACTIVITY LIMITATIONS: carrying, lifting, sleeping, reach over head, and hygiene/grooming   PARTICIPATION LIMITATIONS: meal prep, cleaning, laundry, shopping, and community activity   PERSONAL FACTORS: Age, Fitness, Past/current experiences, Profession, Time since onset of injury/illness/exacerbation, Transportation, and language barrier  are also affecting patient's functional outcome.    REHAB POTENTIAL: Fair has previously completed home health PT for same issue   CLINICAL DECISION MAKING: Evolving/moderate complexity   EVALUATION COMPLEXITY:  Moderate     GOALS: Goals reviewed with patient? Yes   SHORT TERM GOALS: Target date: 05/25/2022       Patient will be independent and compliant with initial HEP.    Baseline: issued at eval  Goal status: INITIAL   2.  Patient will demonstrate proper seated posture to reduce stress about her neck and shoulders.  Baseline: see above  Goal status: INITIAL   3.  Patient will demonstrate at least 160 degrees of Rt shoulder flexion AROM to improve ability to reach into overhead cabinets.  Baseline: see above  Goal status: INITIAL   4.  Patient will demonstrate pain free cervical rotation AROM indicative of improvement in her current condition.  Baseline: see above  Goal status: INITIAL     LONG TERM GOALS: Target date: 06/15/2022       Patient will be able to lift at least 5 lbs into overhead cabinet without shoulder shrug and no increase in pain.  Baseline: shoulder shrug and pain with flexion MMT Goal status: INITIAL   2.  Patient will be able to lift and carry 10 lbs with proper form without an increase in pain to improve ease of lifting and carrying laundry.  Baseline: increased pain with lifting.  Goal status: INITIAL   3.  Patient will report pain as </=5/10 to reduce her current functional limitations.  Baseline: 8 Goal status: INITIAL   4.  Patient will be independent with advanced home program to progress/maintain current level of function.  Baseline: initial HEP issued  Goal status: INITIAL   5.  Patient will be able to reach the base of her occiput with ER functional reach to improve ability to complete self-care activities.  Baseline: see above  Goal status: INITIAL       PLAN:   PT FREQUENCY: 1-2x/week   PT DURATION: 6 weeks   PLANNED INTERVENTIONS: Therapeutic exercises, Therapeutic activity, Neuromuscular re-education, Patient/Family education, Self Care, Dry Needling, Electrical stimulation, Cryotherapy, Moist heat, Taping, Manual therapy, and  Re-evaluation   PLAN FOR NEXT SESSION: TPDN response; posture education, Rt shoulder strengthening and ROM; review and progress HEP.    Gwendolyn Grant, PT, DPT, ATC 05/11/22 10:13 AM

## 2022-05-12 NOTE — Therapy (Signed)
OUTPATIENT PHYSICAL THERAPY TREATMENT NOTE   Patient Name: Amber Mccall MRN: [id number] DOB:[birth year], 72 y.o., female Today's Date: 05/13/2022  PCP: Shelda Pal, DO  REFERRING PROVIDER: Leandrew Koyanagi, MD    END OF SESSION:   PT End of Session - 05/13/22 1100     Visit Number 3    Number of Visits 13    Date for PT Re-Evaluation 06/18/22    Authorization Type MCR/MCD    PT Start Time 1100    PT Stop Time 1141    PT Time Calculation (min) 41 min    Activity Tolerance Patient tolerated treatment well    Behavior During Therapy [facility] for tasks assessed/performed              Past Medical History:  Diagnosis Date   Anginal pain ([place])    pt says she feels like this may be related to acid reflux   Closed fracture of metatarsal bone(s) 01/30/2013   Depression    Essential hypertension    GERD (gastroesophageal reflux disease)    Lumbar radiculopathy    Osteoarthritis    Osteoporosis    Past Surgical History:  Procedure Laterality Date   ABDOMINAL HYSTERECTOMY     APPENDECTOMY     CATARACT EXTRACTION Bilateral 2022   [place] Osteotomy w/ Bone Graft Left 04/16/2015   Left foot   IR RADIOLOGIST EVAL & MGMT  04/11/2017   OPEN REDUCTION INTERNAL FIXATION (ORIF) DISTAL PHALANX Left 10/23/2014   5th Metatarsal   TOTAL KNEE ARTHROPLASTY Right 10/18/2021   Procedure: RIGHT TOTAL KNEE ARTHROPLASTY;  Surgeon: Leandrew Koyanagi, MD;  Location: [place];  Service: Orthopedics;  Laterality: Right;   Patient Active Problem List   Diagnosis Date Noted   Status post total right knee replacement 10/18/2021   Primary osteoarthritis of right knee 10/17/2021   Spondylosis without myelopathy or radiculopathy, lumbar region 09/28/2021   Chronic right shoulder pain 08/18/2021   Adhesive capsulitis of right shoulder 08/18/2021   Gastroesophageal reflux disease 08/18/2021   Primary osteoarthritis of left knee 07/27/2021   Primary osteoarthritis involving multiple joints 07/13/2021    Strain of right trapezius muscle 07/13/2021   Essential hypertension 05/19/2021   Hypertriglyceridemia 05/19/2021   Osteoporosis 05/19/2021   Lumbar radiculopathy 05/19/2021   Osteoarthritis 05/19/2021   Depression, major, single episode, in partial remission ([place]) 05/19/2021   Chronic right-sided low back pain 05/19/2021   Status post left foot surgery 04/23/2015   Injury of foot, left 04/08/2015   Sprain of ankle 04/08/2015   Deformity of metatarsal bone of left foot 04/08/2015   Metatarsalgia of left foot 02/10/2015   Tenosynovitis of foot 02/10/2015   Chronic pain of both knees 02/10/2015   Closed fracture of metatarsal bone 10/21/2014   Edema of left foot 10/21/2014   Closed fracture of metatarsal bone(s) 01/30/2013   Pain, foot 01/30/2013   Abnormal foot finding 01/30/2013    REFERRING DIAG: G83.662H (ICD-10-CM) - Strain of right trapezius muscle, initial encounter    THERAPY DIAG:  Chronic right shoulder pain  Cervicalgia  Muscle weakness (generalized)  Abnormal posture  Rationale for Evaluation and Treatment Rehabilitation  PERTINENT HISTORY: anginal pain, osteoporosis   PRECAUTIONS: none   SUBJECTIVE:  In-person interpreter utilized   SUBJECTIVE STATEMENT:  " It's a little better." She feels that the TPDN helped.    PAIN:  Are you having pain? Yes: NPRS scale: 6/10 Pain location: Rt upper trap Pain description: constant; pulling; stabbing  Aggravating factors: lifting;sleeping on the Rt side Relieving factors: massage gun   OBJECTIVE: (objective measures completed at initial evaluation unless otherwise dated)  DIAGNOSTIC FINDINGS:  Rt shoulder X-ray:   Mild age-appropriate glenohumeral osteoarthritis.  No acute or structural  abnormalities.    PATIENT SURVEYS:  N/A  language    COGNITION: Overall cognitive status: Within functional limits for tasks assessed                                  SENSATION: Not tested   POSTURE: Forward head, rounded shoulders    CERVICAL ROM:    Active ROM A/PROM (deg) eval  Flexion WNL  Extension WNL  Right lateral flexion WNL  Left lateral flexion WNL  Right rotation WNL pn  Left rotation WNL pn   (Blank rows = not tested)   UPPER EXTREMITY ROM:    Active ROM Right eval Left eval 05/13/22 Right   Shoulder flexion 145   149 active; full passive  Shoulder extension       Shoulder abduction WNL      Shoulder adduction       Shoulder internal rotation L1 T6   Shoulder external rotation Top of head T2   Elbow flexion       Elbow extension       Wrist flexion       Wrist extension       Wrist ulnar deviation       Wrist radial deviation       Wrist pronation       Wrist supination       (Blank rows = not tested) pain about Rt upper trap with all Rt shoulder AROM   UPPER EXTREMITY MMT:   MMT Right eval Left eval  Shoulder flexion 4; pn shoulder shrug 4+  Shoulder extension      Shoulder abduction 4- pn; shoulder shrug 4+  Shoulder adduction      Shoulder internal rotation 5 5  Shoulder external rotation 5 5  Middle trapezius      Lower trapezius      Elbow flexion      Elbow extension      Wrist flexion      Wrist extension      Wrist ulnar deviation      Wrist radial deviation      Wrist pronation      Wrist supination      Grip strength (lbs)      (Blank rows = not tested)   SPECIAL TESTS: Cervical Compression and distraction (-)    PALPATION:  TTP and trigger points present Rt upper trap and levator scapulae   [facility] Adult PT Treatment:                                                DATE: 05/13/22 Therapeutic Exercise: UBE level 1.5 x 2 min each fwd/bwd  Prone scapular retraction 2 x 10 Supine shoulder flexion with dowel x 10 Supine horizontal shoulder abduction yellow band 2 x  10  Updated HEP Manual Therapy: STM/DTM/trigger point Rt upper trap, levator scapulae, cervical paraspinals Passive upper trap stretch  Trigger Point Dry Needling Treatment: Pre-treatment instruction: Patient instructed on dry needling rationale, procedures, and possible side effects including pain during treatment (achy,cramping feeling), bruising, drop of blood, lightheadedness, nausea, sweating. Patient Consent Given: Yes Education handout provided: Previously provided Muscles treated: Rt upper trap   Needle size and number: .30x37m x 1 Treatment response/outcome: Twitch response elicited and Palpable decrease in muscle tension Post-treatment instructions: Patient instructed to expect possible mild to moderate muscle soreness later today and/or tomorrow. Patient instructed in methods to reduce muscle soreness and to continue prescribed HEP. If patient was dry needled over the lung field, patient was instructed on signs and symptoms of pneumothorax and, however unlikely, to see immediate medical attention should they occur. Patient was also educated on signs and symptoms of infection and to seek medical attention should they occur. Patient verbalized understanding of these instructions and education.   [facility]                                                [facility]: [facility] Therapeutic Exercise: UBE level 1.5 x 2 min each fwd/bwd Scapular retraction 2 x 10  Upper trap stretch 2 x 30 sec  Levator scap stretch 2 x 30 sec  Pec doorway stretch 2 x 30 sec  Cervical retraction 2 x 10  Manual Therapy: STM/DTM Rt upper trap, levator scapulae Passive upper trap stretch  Trigger Point Dry Needling Treatment: Pre-treatment instruction: Patient instructed on dry needling rationale, procedures, and possible side effects including pain during treatment (achy,cramping feeling), bruising, drop of blood, lightheadedness, nausea, sweating. Patient Consent Given: Yes Education handout  provided: Previously provided Muscles treated: Rt upper trap   Needle size and number: .30x566mx 1 Treatment response/outcome: Twitch response elicited and Palpable decrease in muscle tension Post-treatment instructions: Patient instructed to expect possible mild to moderate muscle soreness later today and/or tomorrow. Patient instructed in methods to reduce muscle soreness and to continue prescribed HEP. If patient was dry needled over the lung field, patient was instructed on signs and symptoms of pneumothorax and, however unlikely, to see immediate medical attention should they occur. Patient was also educated on signs and symptoms of infection and to seek medical attention should they occur. Patient verbalized understanding of these instructions and education.   [facility]                                                [facility]: [facility] Therapeutic Exercise: Demonstrated and issue initial HEP.    Therapeutic Activity: Education on assessment findings that will be addressed throughout duration of POC.    Self Care: Posture education      PATIENT EDUCATION: Education details: HEP; TPDN Person educated: Patient Education method: Explanation, Demonstration, Tactile cues, Verbal cues, handout  Education comprehension: verbalized understanding, returned demonstration, verbal cues required, tactile cues required, and needs further education   HOME EXERCISE PROGRAM: Access Code: [email or web address]: [email or web address] Date: 05/04/2022 Prepared by: SaGwendolyn Grant Exercises - Seated Scapular Retraction  - 2 x daily - 7 x weekly - 2 sets - 10 reps - 5 sec  hold -  Seated Upper Trapezius Stretch  - 2 x daily - 7 x weekly - 3 sets - 30 sec  hold - Seated Levator Scapulae Stretch  - 2 x daily - 7 x weekly - 3 sets - 30 sec  hold - Doorway Pec Stretch at 60 Degrees Abduction with Arm Straight  - 2 x daily - 7 x weekly - 3 sets - 30 sec  hold   ASSESSMENT:   CLINICAL  IMPRESSION: Patient reports slight improvement in her Rt upper trap pain compared to previous session stating that the TPDN was helpful. Further TPDN performed to the Rt upper trap with patient reporting soreness post-intervention as expected. Able to progress postural strengthening with patient having difficulty controlling for excessive upper trap engagement. She is noted to have limited Rt shoulder flexion AROM reporting pain in the Rt upper trap, but full passive range noted with patient reporting a stretch at end range. HEP updated to include further strengthening.    OBJECTIVE IMPAIRMENTS: decreased activity tolerance, decreased knowledge of condition, decreased ROM, decreased strength, increased fascial restrictions, impaired flexibility, impaired UE functional use, improper body mechanics, postural dysfunction, and pain.    ACTIVITY LIMITATIONS: carrying, lifting, sleeping, reach over head, and hygiene/grooming   PARTICIPATION LIMITATIONS: meal prep, cleaning, laundry, shopping, and community activity   PERSONAL FACTORS: Age, Fitness, Past/current experiences, Profession, Time since onset of injury/illness/exacerbation, Transportation, and language barrier  are also affecting patient's functional outcome.    REHAB POTENTIAL: Fair has previously completed home health PT for same issue   CLINICAL DECISION MAKING: Evolving/moderate complexity   EVALUATION COMPLEXITY: Moderate     GOALS: Goals reviewed with patient? Yes   SHORT TERM GOALS: Target date: 05/25/2022       Patient will be independent and compliant with initial HEP.    Baseline: issued at eval  Goal status: INITIAL   2.  Patient will demonstrate proper seated posture to reduce stress about her neck and shoulders.  Baseline: see above  Goal status: INITIAL   3.  Patient will demonstrate at least 160 degrees of Rt shoulder flexion AROM to improve ability to reach into overhead cabinets.  Baseline: see above  Goal  status: ongoing   4.  Patient will demonstrate pain free cervical rotation AROM indicative of improvement in her current condition.  Baseline: see above  Goal status: INITIAL     LONG TERM GOALS: Target date: 06/15/2022       Patient will be able to lift at least 5 lbs into overhead cabinet without shoulder shrug and no increase in pain.  Baseline: shoulder shrug and pain with flexion MMT Goal status: INITIAL   2.  Patient will be able to lift and carry 10 lbs with proper form without an increase in pain to improve ease of lifting and carrying laundry.  Baseline: increased pain with lifting.  Goal status: INITIAL   3.  Patient will report pain as </=5/10 to reduce her current functional limitations.  Baseline: 8 Goal status: INITIAL   4.  Patient will be independent with advanced home program to progress/maintain current level of function.  Baseline: initial HEP issued  Goal status: INITIAL   5.  Patient will be able to reach the base of her occiput with ER functional reach to improve ability to complete self-care activities.  Baseline: see above  Goal status: INITIAL       PLAN:   PT FREQUENCY: 1-2x/week   PT DURATION: 6 weeks   PLANNED INTERVENTIONS: Therapeutic exercises,  Therapeutic activity, Neuromuscular re-education, Patient/Family education, Self Care, Dry Needling, Electrical stimulation, Cryotherapy, Moist heat, Taping, Manual therapy, and Re-evaluation   PLAN FOR NEXT SESSION: TPDN response; posture education, Rt shoulder strengthening and ROM; review and progress HEP.    Gwendolyn Grant, PT, DPT, ATC 05/13/22 11:44 AM

## 2022-05-12 NOTE — Telephone Encounter (Signed)
[id number]. approved through 06/09/2022.

## 2022-05-13 ENCOUNTER — Ambulatory Visit: Payer: 59

## 2022-05-13 DIAGNOSIS — R293 Abnormal posture: Secondary | ICD-10-CM | POA: Diagnosis not present

## 2022-05-13 DIAGNOSIS — G8929 Other chronic pain: Secondary | ICD-10-CM

## 2022-05-13 DIAGNOSIS — S46811A Strain of other muscles, fascia and tendons at shoulder and upper arm level, right arm, initial encounter: Secondary | ICD-10-CM | POA: Diagnosis not present

## 2022-05-13 DIAGNOSIS — M542 Cervicalgia: Secondary | ICD-10-CM | POA: Diagnosis not present

## 2022-05-13 DIAGNOSIS — M6281 Muscle weakness (generalized): Secondary | ICD-10-CM | POA: Diagnosis not present

## 2022-05-13 DIAGNOSIS — M25511 Pain in right shoulder: Secondary | ICD-10-CM | POA: Diagnosis not present

## 2022-05-16 ENCOUNTER — Ambulatory Visit: Payer: 59 | Admitting: Physical Therapy

## 2022-05-16 ENCOUNTER — Encounter: Payer: Self-pay | Admitting: Physical Therapy

## 2022-05-16 ENCOUNTER — Other Ambulatory Visit: Payer: Self-pay

## 2022-05-16 DIAGNOSIS — M25511 Pain in right shoulder: Secondary | ICD-10-CM | POA: Diagnosis not present

## 2022-05-16 DIAGNOSIS — M6281 Muscle weakness (generalized): Secondary | ICD-10-CM

## 2022-05-16 DIAGNOSIS — S46811A Strain of other muscles, fascia and tendons at shoulder and upper arm level, right arm, initial encounter: Secondary | ICD-10-CM | POA: Diagnosis not present

## 2022-05-16 DIAGNOSIS — G8929 Other chronic pain: Secondary | ICD-10-CM | POA: Diagnosis not present

## 2022-05-16 DIAGNOSIS — R293 Abnormal posture: Secondary | ICD-10-CM | POA: Diagnosis not present

## 2022-05-16 DIAGNOSIS — M542 Cervicalgia: Secondary | ICD-10-CM

## 2022-05-16 NOTE — Therapy (Signed)
OUTPATIENT PHYSICAL THERAPY TREATMENT NOTE   Patient Name: Amber Mccall MRN: [id number] DOB:29-May-1950, 72 y.o., female Today's Date: 05/16/2022  PCP: Shelda Pal, DO  REFERRING PROVIDER: Leandrew Koyanagi, MD    END OF SESSION:   PT End of Session - 05/16/22 1103     Visit Number 4    Number of Visits 13    Date for PT Re-Evaluation 06/18/22    Authorization Type MCR/MCD    PT Start Time 1100    PT Stop Time 1145    PT Time Calculation (min) 45 min    Activity Tolerance Patient tolerated treatment well    Behavior During Therapy [facility] for tasks assessed/performed               Past Medical History:  Diagnosis Date   Anginal pain ([place])    pt says she feels like this may be related to acid reflux   Closed fracture of metatarsal bone(s) 01/30/2013   Depression    Essential hypertension    GERD (gastroesophageal reflux disease)    Lumbar radiculopathy    Osteoarthritis    Osteoporosis    Past Surgical History:  Procedure Laterality Date   ABDOMINAL HYSTERECTOMY     APPENDECTOMY     CATARACT EXTRACTION Bilateral 2022   [place] Osteotomy w/ Bone Graft Left 04/16/2015   Left foot   IR RADIOLOGIST EVAL & MGMT  04/11/2017   OPEN REDUCTION INTERNAL FIXATION (ORIF) DISTAL PHALANX Left 10/23/2014   5th Metatarsal   TOTAL KNEE ARTHROPLASTY Right 10/18/2021   Procedure: RIGHT TOTAL KNEE ARTHROPLASTY;  Surgeon: Leandrew Koyanagi, MD;  Location: [place];  Service: Orthopedics;  Laterality: Right;   Patient Active Problem List   Diagnosis Date Noted   Status post total right knee replacement 10/18/2021   Primary osteoarthritis of right knee 10/17/2021   Spondylosis without myelopathy or radiculopathy, lumbar region 09/28/2021   Chronic right shoulder pain 08/18/2021   Adhesive capsulitis of right shoulder 08/18/2021   Gastroesophageal reflux disease 08/18/2021   Primary osteoarthritis of left knee 07/27/2021   Primary osteoarthritis involving multiple joints 07/13/2021    Strain of right trapezius muscle 07/13/2021   Essential hypertension 05/19/2021   Hypertriglyceridemia 05/19/2021   Osteoporosis 05/19/2021   Lumbar radiculopathy 05/19/2021   Osteoarthritis 05/19/2021   Depression, major, single episode, in partial remission ([place]) 05/19/2021   Chronic right-sided low back pain 05/19/2021   Status post left foot surgery 04/23/2015   Injury of foot, left 04/08/2015   Sprain of ankle 04/08/2015   Deformity of metatarsal bone of left foot 04/08/2015   Metatarsalgia of left foot 02/10/2015   Tenosynovitis of foot 02/10/2015   Chronic pain of both knees 02/10/2015   Closed fracture of metatarsal bone 10/21/2014   Edema of left foot 10/21/2014   Closed fracture of metatarsal bone(s) 01/30/2013   Pain, foot 01/30/2013   Abnormal foot finding 01/30/2013    REFERRING DIAG: E52.778E (ICD-10-CM) - Strain of right trapezius muscle, initial encounter    THERAPY DIAG:  Chronic right shoulder pain  Cervicalgia  Muscle weakness (generalized)  Abnormal posture  Rationale for Evaluation and Treatment Rehabilitation  PERTINENT HISTORY: anginal pain, osteoporosis   PRECAUTIONS: none    SUBJECTIVE:  In-person interpreter utilized SUBJECTIVE STATEMENT: Patient reports right shoulder still hurting. She states that with the therapy it is getting better but still having pain and trouble sleeping at night. States the dry needling is helpful the next day, but the pain then returns.   PAIN:  Are you having pain? Yes:  NPRS scale: 7/10 Pain location: Rt upper trap Pain description: constant; pulling; stabbing  Aggravating factors: lifting;sleeping on the Rt side Relieving factors: massage gun   OBJECTIVE: (objective measures completed at initial evaluation unless otherwise dated)   POSTURE: Forward head, rounded shoulders    CERVICAL ROM:    Active ROM A/PROM (deg) eval  Flexion WNL  Extension WNL  Right lateral flexion WNL  Left lateral flexion WNL  Right rotation WNL pn  Left rotation WNL pn   (Blank rows = not tested)   UPPER EXTREMITY ROM:    Active ROM Right eval Left eval 05/13/22 Right   Shoulder flexion 145   149 active; full passive  Shoulder extension       Shoulder abduction WNL      Shoulder adduction       Shoulder internal rotation L1 T6   Shoulder external rotation Top of head T2   Elbow flexion       Elbow extension       Wrist flexion       Wrist extension       Wrist ulnar deviation       Wrist radial deviation       Wrist pronation       Wrist supination       (Blank rows = not tested) pain about Rt upper trap with all Rt shoulder AROM   UPPER EXTREMITY MMT:   MMT Right eval Left eval  Shoulder flexion 4; pn shoulder shrug 4+  Shoulder extension      Shoulder abduction 4- pn; shoulder shrug 4+  Shoulder adduction      Shoulder internal rotation 5 5  Shoulder external rotation 5 5  Middle trapezius      Lower trapezius      Elbow flexion      Elbow extension      Wrist flexion      Wrist extension      Wrist ulnar deviation      Wrist radial deviation      Wrist pronation      Wrist supination      Grip strength (lbs)      (Blank rows = not tested)   PALPATION:  TTP and trigger points present Rt upper trap and levator scapulae    TODAY'S TREATMENT [facility] Adult PT Treatment:                                                DATE: 05/16/2022 Therapeutic Exercise: UBE L1 x 4 min (2 fwd/bwd) while taking subjective Seated upper trap and levator stretch 2 x 20 sec each Sidelying thoracic rotation stretch x 10 each Prone I and T x 10 each Seated double ER and scap retraction with red 2 x 10 Row with red 2 x 10 Extension with red 2 x 10 Updated HEP Manual Therapy: Skilled palpation and monitoring of muscle  tension while performing TPDN treatment STM/DTM/trigger point Rt upper trap, levator scapulae Trigger Point Dry Needling Treatment: Pre-treatment instruction: Patient instructed on dry  needling rationale, procedures, and possible side effects including pain during treatment (achy,cramping feeling), bruising, drop of blood, lightheadedness, nausea, sweating. Patient Consent Given: Yes Education handout provided: Previously provided Muscles treated: Rt upper trap and levator scap Needle size and number: .30x62m x 3 Treatment response/outcome: Twitch response elicited and Palpable decrease in muscle tension Post-treatment instructions: Patient instructed to expect possible mild to moderate muscle soreness later today and/or tomorrow. Patient instructed in methods to reduce muscle soreness and to continue prescribed HEP. If patient was dry needled over the lung field, patient was instructed on signs and symptoms of pneumothorax and, however unlikely, to see immediate medical attention should they occur. Patient was also educated on signs and symptoms of infection and to seek medical attention should they occur. Patient verbalized understanding of these instructions and education.   [facility]                                                [facility]: [facility] Therapeutic Exercise: UBE level 1.5 x 2 min each fwd/bwd  Prone scapular retraction 2 x 10 Supine shoulder flexion with dowel x 10 Supine horizontal shoulder abduction yellow band 2 x 10 Updated HEP Manual Therapy: STM/DTM/trigger point Rt upper trap, levator scapulae, cervical paraspinals Passive upper trap stretch  Trigger Point Dry Needling Treatment: Pre-treatment instruction: Patient instructed on dry needling rationale, procedures, and possible side effects including pain during treatment (achy,cramping feeling), bruising, drop of blood, lightheadedness, nausea, sweating. Patient Consent Given: Yes Education handout provided:  Previously provided Muscles treated: Rt upper trap   Needle size and number: .30x551mx 1 Treatment response/outcome: Twitch response elicited and Palpable decrease in muscle tension Post-treatment instructions: Patient instructed to expect possible mild to moderate muscle soreness later today and/or tomorrow. Patient instructed in methods to reduce muscle soreness and to continue prescribed HEP. If patient was dry needled over the lung field, patient was instructed on signs and symptoms of pneumothorax and, however unlikely, to see immediate medical attention should they occur. Patient was also educated on signs and symptoms of infection and to seek medical attention should they occur. Patient verbalized understanding of these instructions and education.  [facility]                                                [facility]: 05[facility]22 Therapeutic Exercise: UBE level 1.5 x 2 min each fwd/bwd Scapular retraction 2 x 10  Upper trap stretch 2 x 30 sec  Levator scap stretch 2 x 30 sec  Pec doorway stretch 2 x 30 sec  Cervical retraction 2 x 10  Manual Therapy: STM/DTM Rt upper trap, levator scapulae Passive upper trap stretch  Trigger Point Dry Needling Treatment: Pre-treatment instruction: Patient instructed on dry needling rationale, procedures, and possible side effects including pain during treatment (achy,cramping feeling), bruising, drop of blood, lightheadedness, nausea, sweating. Patient Consent Given: Yes Education handout provided: Previously provided Muscles treated: Rt upper trap   Needle size and number: .30x5084m 1 Treatment response/outcome: Twitch response elicited and Palpable decrease in muscle tension Post-treatment instructions: Patient instructed to expect possible mild to moderate muscle soreness later today and/or tomorrow. Patient instructed in methods to reduce muscle soreness and to continue  prescribed HEP. If patient was dry needled over the lung field, patient  was instructed on signs and symptoms of pneumothorax and, however unlikely, to see immediate medical attention should they occur. Patient was also educated on signs and symptoms of infection and to seek medical attention should they occur. Patient verbalized understanding of these instructions and education.   PATIENT EDUCATION: Education details: HEP update; TPDN Person educated: Patient Education method: Explanation, Demonstration, Tactile cues, Verbal cues, Handout  Education comprehension: verbalized understanding, returned demonstration, verbal cues required, tactile cues required, and needs further education   HOME EXERCISE PROGRAM: Access Code: [email or web address]    ASSESSMENT: CLINICAL IMPRESSION: Patient tolerated therapy well with no adverse effects. Continued with TPDN for right upper trap this visit as patient has found this beneficial. Elicited multiple muscle twitches and patient reporting good therapeutic benefit following treatment. Therapy continues to focus on progressing postural strength and control. She was able to progress with resistance for periscapular strengthening but does require frequent cueing for proper exercises technique and avoiding scapular shrug. Updated HEP to progress banded strengthening for home. Patient would benefit from continued skilled PT to progress her mobility and strength in order to reduce pain and maximize functional ability.    OBJECTIVE IMPAIRMENTS: decreased activity tolerance, decreased knowledge of condition, decreased ROM, decreased strength, increased fascial restrictions, impaired flexibility, impaired UE functional use, improper body mechanics, postural dysfunction, and pain.    ACTIVITY LIMITATIONS: carrying, lifting, sleeping, reach over head, and hygiene/grooming   PARTICIPATION LIMITATIONS: meal prep, cleaning, laundry, shopping, and community activity   PERSONAL FACTORS: Age, Fitness, Past/current experiences, Profession, Time since onset  of injury/illness/exacerbation, Transportation, and language barrier  are also affecting patient's functional outcome.      GOALS: Goals reviewed with patient? Yes   SHORT TERM GOALS: Target date: 05/25/2022   Patient will be independent and compliant with initial HEP.  Baseline: issued at eval  Goal status: INITIAL   2.  Patient will demonstrate proper seated posture to reduce stress about her neck and shoulders.  Baseline: see above  Goal status: INITIAL   3.  Patient will demonstrate at least 160 degrees of Rt shoulder flexion AROM to improve ability to reach into overhead cabinets.  Baseline: see above  Goal status: ongoing   4.  Patient will demonstrate pain free cervical rotation AROM indicative of improvement in her current condition.  Baseline: see above  Goal status: INITIAL    LONG TERM GOALS: Target date: 06/15/2022   Patient will be able to lift at least 5 lbs into overhead cabinet without shoulder shrug and no increase in pain.  Baseline: shoulder shrug and pain with flexion MMT Goal status: INITIAL   2.  Patient will be able to lift and carry 10 lbs with proper form without an increase in pain to improve ease of lifting and carrying laundry.  Baseline: increased pain with lifting.  Goal status: INITIAL   3.  Patient will report pain as </=5/10 to reduce her current functional limitations.  Baseline: 8 Goal status: INITIAL   4.  Patient will be independent with advanced home program to progress/maintain current level of function.  Baseline: initial HEP issued  Goal status: INITIAL   5.  Patient will be able to reach the base of her occiput with ER functional reach to improve ability to complete self-care activities.  Baseline: see above  Goal status: INITIAL       PLAN: PT FREQUENCY: 1-2x/week   PT DURATION: 6 weeks  PLANNED INTERVENTIONS: Therapeutic exercises, Therapeutic activity, Neuromuscular re-education, Patient/Family education, Self Care, Dry  Needling, Electrical stimulation, Cryotherapy, Moist heat, Taping, Manual therapy, and Re-evaluation   PLAN FOR NEXT SESSION: TPDN response; posture education, Rt shoulder strengthening and ROM; review and progress HEP.    Hilda Blades, PT, DPT, LAT, ATC 05/16/22  11:53 AM Phone: [phone] Fax: [phone]

## 2022-05-16 NOTE — Patient Instructions (Signed)
Access Code: [email or web address] URL: [email or web address] Date: 05/16/2022 Prepared by: Hilda Blades  Exercises - Seated Scapular Retraction  - 2 x daily - 7 x weekly - 2 sets - 10 reps - 5 sec  hold - Seated Upper Trapezius Stretch  - 2 x daily - 7 x weekly - 3 sets - 30 sec  hold - Seated Levator Scapulae Stretch  - 2 x daily - 7 x weekly - 3 sets - 30 sec  hold - Doorway Pec Stretch at 60 Degrees Abduction with Arm Straight  - 2 x daily - 7 x weekly - 3 sets - 30 sec  hold - Prone Scapular Slide with Shoulder Extension  - 1 x daily - 7 x weekly - 2 sets - 10 reps - Supine Shoulder Flexion Extension AAROM with Dowel  - 1 x daily - 7 x weekly - 1 sets - 10 reps - 5 sec  hold - Supine Shoulder Horizontal Abduction with Resistance  - 1 x daily - 7 x weekly - 2 sets - 10 reps - Shoulder External Rotation and Scapular Retraction with Resistance  - 1 x daily - 7 x weekly - 2 sets - 10 reps - Standing Row with Anchored Resistance  - 1 x daily - 2 sets - 10 reps

## 2022-05-18 ENCOUNTER — Ambulatory Visit: Payer: 59

## 2022-05-18 DIAGNOSIS — M542 Cervicalgia: Secondary | ICD-10-CM | POA: Diagnosis not present

## 2022-05-18 DIAGNOSIS — S46811A Strain of other muscles, fascia and tendons at shoulder and upper arm level, right arm, initial encounter: Secondary | ICD-10-CM | POA: Diagnosis not present

## 2022-05-18 DIAGNOSIS — M6281 Muscle weakness (generalized): Secondary | ICD-10-CM

## 2022-05-18 DIAGNOSIS — R293 Abnormal posture: Secondary | ICD-10-CM

## 2022-05-18 DIAGNOSIS — G8929 Other chronic pain: Secondary | ICD-10-CM | POA: Diagnosis not present

## 2022-05-18 DIAGNOSIS — M25511 Pain in right shoulder: Secondary | ICD-10-CM | POA: Diagnosis not present

## 2022-05-18 NOTE — Therapy (Signed)
OUTPATIENT PHYSICAL THERAPY TREATMENT NOTE   Patient Name: Amber Mccall MRN: [id number] DOB:[birth year], 72 y.o., female Today's Date: 05/18/2022  PCP: Shelda Pal, DO  REFERRING PROVIDER: Leandrew Koyanagi, MD    END OF SESSION:   PT End of Session - 05/18/22 1101     Visit Number 5    Number of Visits 13    Date for PT Re-Evaluation 06/18/22    Authorization Type MCR/MCD    PT Start Time 1100    PT Stop Time 1143    PT Time Calculation (min) 43 min    Activity Tolerance Patient tolerated treatment well    Behavior During Therapy [facility] for tasks assessed/performed                Past Medical History:  Diagnosis Date   Anginal pain ([place])    pt says she feels like this may be related to acid reflux   Closed fracture of metatarsal bone(s) 01/30/2013   Depression    Essential hypertension    GERD (gastroesophageal reflux disease)    Lumbar radiculopathy    Osteoarthritis    Osteoporosis    Past Surgical History:  Procedure Laterality Date   ABDOMINAL HYSTERECTOMY     APPENDECTOMY     CATARACT EXTRACTION Bilateral 2022   Cotton Osteotomy w/ Bone Graft Left 04/16/2015   Left foot   IR RADIOLOGIST EVAL & MGMT  04/11/2017   OPEN REDUCTION INTERNAL FIXATION (ORIF) DISTAL PHALANX Left 10/23/2014   5th Metatarsal   TOTAL KNEE ARTHROPLASTY Right 10/18/2021   Procedure: RIGHT TOTAL KNEE ARTHROPLASTY;  Surgeon: Leandrew Koyanagi, MD;  Location: [place];  Service: Orthopedics;  Laterality: Right;   Patient Active Problem List   Diagnosis Date Noted   Status post total right knee replacement 10/18/2021   Primary osteoarthritis of right knee 10/17/2021   Spondylosis without myelopathy or radiculopathy, lumbar region 09/28/2021   Chronic right shoulder pain 08/18/2021   Adhesive capsulitis of right shoulder 08/18/2021   Gastroesophageal reflux disease 08/18/2021   Primary osteoarthritis of left knee 07/27/2021   Primary osteoarthritis involving multiple joints 07/13/2021    Strain of right trapezius muscle 07/13/2021   Essential hypertension 05/19/2021   Hypertriglyceridemia 05/19/2021   Osteoporosis 05/19/2021   Lumbar radiculopathy 05/19/2021   Osteoarthritis 05/19/2021   Depression, major, single episode, in partial remission ([place]) 05/19/2021   Chronic right-sided low back pain 05/19/2021   Status post left foot surgery 04/23/2015   Injury of foot, left 04/08/2015   Sprain of ankle 04/08/2015   Deformity of metatarsal bone of left foot 04/08/2015   Metatarsalgia of left foot 02/10/2015   Tenosynovitis of foot 02/10/2015   Chronic pain of both knees 02/10/2015   Closed fracture of metatarsal bone 10/21/2014   Edema of left foot 10/21/2014   Closed fracture of metatarsal bone(s) 01/30/2013   Pain, foot 01/30/2013   Abnormal foot finding 01/30/2013    REFERRING DIAG: Z32.992E (ICD-10-CM) - Strain of right trapezius muscle, initial encounter    THERAPY DIAG:  Chronic right shoulder pain  Cervicalgia  Muscle weakness (generalized)  Abnormal posture  Rationale for Evaluation and Treatment Rehabilitation  PERTINENT HISTORY: anginal pain, osteoporosis   PRECAUTIONS: none    SUBJECTIVE:  video interpreter utilized SUBJECTIVE STATEMENT: " I don't think my condition is improving. It hurts more."   PAIN:  Are you having pain? Yes:  NPRS scale: 7/10 Pain location: Rt upper trap Pain description: constant; pulling; stabbing  Aggravating factors: lifting;sleeping on the Rt side Relieving factors: massage gun   OBJECTIVE: (objective measures completed at initial evaluation unless otherwise dated)  POSTURE: Forward head, rounded shoulders    CERVICAL ROM:    Active ROM A/PROM (deg) eval 05/18/22  Flexion WNL WNL  Extension WNL WNL pn  Right lateral flexion WNL  WNL  Left lateral flexion WNL WNL  Right rotation WNL pn WNL  Left rotation WNL pn WNL   (Blank rows = not tested)   UPPER EXTREMITY ROM:    Active ROM Right eval Left eval 05/13/22 Right   Shoulder flexion 145   149 active; full passive  Shoulder extension       Shoulder abduction WNL      Shoulder adduction       Shoulder internal rotation L1 T6   Shoulder external rotation Top of head T2   Elbow flexion       Elbow extension       Wrist flexion       Wrist extension       Wrist ulnar deviation       Wrist radial deviation       Wrist pronation       Wrist supination       (Blank rows = not tested) pain about Rt upper trap with all Rt shoulder AROM   UPPER EXTREMITY MMT:   MMT Right eval Left eval  Shoulder flexion 4; pn shoulder shrug 4+  Shoulder extension      Shoulder abduction 4- pn; shoulder shrug 4+  Shoulder adduction      Shoulder internal rotation 5 5  Shoulder external rotation 5 5  Middle trapezius      Lower trapezius      Elbow flexion      Elbow extension      Wrist flexion      Wrist extension      Wrist ulnar deviation      Wrist radial deviation      Wrist pronation      Wrist supination      Grip strength (lbs)      (Blank rows = not tested)   PALPATION:  TTP and trigger points present Rt upper trap and levator scapulae    TODAY'S TREATMENT [facility] Adult PT Treatment:                                                DATE: 05/18/22 Therapeutic Exercise: UBE level 2 x 2 min each fwd/bwd Sidelying shoulder abduction 2 x 10; 2#  Sidelying shoulder ER 2 x 10; 2# Bilateral shoulder ER with retraction 2 x 10; red band  Resisted shoulder adduction red band 2 x 10 Manual Therapy: STM/DTM/Trigger point release Rt upper trap, levator scapulae, rhomboids, cervical paraspinals Rt scapular mobilizations superior/inferior, upward/downward rotation Gentle Rt GHJ oscillations    [facility] Adult PT Treatment:  DATE: 05/16/2022 Therapeutic Exercise: UBE L1 x 4 min (2 fwd/bwd) while taking subjective Seated upper trap and levator stretch 2 x 20 sec each Sidelying thoracic rotation stretch x 10 each Prone I and T x 10 each Seated double ER and scap retraction with red 2 x 10 Row with red 2 x 10 Extension with red 2 x 10 Updated HEP Manual Therapy: Skilled palpation and monitoring of muscle tension while performing TPDN treatment STM/DTM/trigger point Rt upper trap, levator scapulae Trigger Point Dry Needling Treatment: Pre-treatment instruction: Patient instructed on dry needling rationale, procedures, and possible side effects including pain during treatment (achy,cramping feeling), bruising, drop of blood, lightheadedness, nausea, sweating. Patient Consent Given: Yes Education handout provided: Previously provided Muscles treated: Rt upper trap and levator scap Needle size and number: .30x41m x 3 Treatment response/outcome: Twitch response elicited and Palpable decrease in muscle tension Post-treatment instructions: Patient instructed to expect possible mild to moderate muscle soreness later today and/or tomorrow. Patient instructed in methods to reduce muscle soreness and to continue prescribed HEP. If patient was dry needled over the lung field, patient was instructed on signs and symptoms of pneumothorax and, however unlikely, to see immediate medical attention should they occur. Patient was also educated on signs and symptoms of infection and to seek medical attention should they occur. Patient verbalized understanding of these instructions and education.   [facility]                                                [facility]: [facility]22 Therapeutic Exercise: UBE level 1.5 x 2 min each fwd/bwd  Prone scapular retraction 2 x 10 Supine shoulder flexion with dowel x 10 Supine horizontal shoulder abduction yellow band 2 x 10 Updated HEP Manual Therapy: STM/DTM/trigger point Rt upper trap,  levator scapulae, cervical paraspinals Passive upper trap stretch  Trigger Point Dry Needling Treatment: Pre-treatment instruction: Patient instructed on dry needling rationale, procedures, and possible side effects including pain during treatment (achy,cramping feeling), bruising, drop of blood, lightheadedness, nausea, sweating. Patient Consent Given: Yes Education handout provided: Previously provided Muscles treated: Rt upper trap   Needle size and number: .30x552mx 1 Treatment response/outcome: Twitch response elicited and Palpable decrease in muscle tension Post-treatment instructions: Patient instructed to expect possible mild to moderate muscle soreness later today and/or tomorrow. Patient instructed in methods to reduce muscle soreness and to continue prescribed HEP. If patient was dry needled over the lung field, patient was instructed on signs and symptoms of pneumothorax and, however unlikely, to see immediate medical attention should they occur. Patient was also educated on signs and symptoms of infection and to seek medical attention should they occur. Patient verbalized understanding of these instructions and education.    PATIENT EDUCATION: Education details: HEP review Person educated: Patient Education method: Explanation Education comprehension: verbalized understanding   HOME EXERCISE PROGRAM: Access Code: [id number]  ASSESSMENT: CLINICAL IMPRESSION: Patient tolerated therapy well with no adverse effects. She reports no change in her Rt upper trap pain and is unsure if the needling has been helpful. She demonstrates normalized cervical AROM reporting pain about the C-spine with cervical extension, but no change in her Rt upper trap pain with cervical AROM. Focused on progression of shoulder and periscapular strengthening with patient reporting pain in the upper trap with targeted rotator cuff strengthening (specifically resisted ER).  OBJECTIVE IMPAIRMENTS:  decreased activity tolerance, decreased knowledge of condition, decreased ROM, decreased strength, increased fascial restrictions, impaired flexibility, impaired UE functional use, improper body mechanics, postural dysfunction, and pain.    ACTIVITY LIMITATIONS: carrying, lifting, sleeping, reach over head, and hygiene/grooming   PARTICIPATION LIMITATIONS: meal prep, cleaning, laundry, shopping, and community activity   PERSONAL FACTORS: Age, Fitness, Past/current experiences, Profession, Time since onset of injury/illness/exacerbation, Transportation, and language barrier  are also affecting patient's functional outcome.      GOALS: Goals reviewed with patient? Yes   SHORT TERM GOALS: Target date: 05/25/2022   Patient will be independent and compliant with initial HEP.  Baseline: issued at eval  Goal status: INITIAL   2.  Patient will demonstrate proper seated posture to reduce stress about her neck and shoulders.  Baseline: see above  Goal status: INITIAL   3.  Patient will demonstrate at least 160 degrees of Rt shoulder flexion AROM to improve ability to reach into overhead cabinets.  Baseline: see above  Goal status: ongoing   4.  Patient will demonstrate pain free cervical rotation AROM indicative of improvement in her current condition.  Baseline: see above  Goal status: ongoing    LONG TERM GOALS: Target date: 06/15/2022   Patient will be able to lift at least 5 lbs into overhead cabinet without shoulder shrug and no increase in pain.  Baseline: shoulder shrug and pain with flexion MMT Goal status: INITIAL   2.  Patient will be able to lift and carry 10 lbs with proper form without an increase in pain to improve ease of lifting and carrying laundry.  Baseline: increased pain with lifting.  Goal status: INITIAL   3.  Patient will report pain as </=5/10 to reduce her current functional limitations.  Baseline: 8 Goal status: INITIAL   4.  Patient will be independent with  advanced home program to progress/maintain current level of function.  Baseline: initial HEP issued  Goal status: INITIAL   5.  Patient will be able to reach the base of her occiput with ER functional reach to improve ability to complete self-care activities.  Baseline: see above  Goal status: INITIAL       PLAN: PT FREQUENCY: 1-2x/week   PT DURATION: 6 weeks   PLANNED INTERVENTIONS: Therapeutic exercises, Therapeutic activity, Neuromuscular re-education, Patient/Family education, Self Care, Dry Needling, Electrical stimulation, Cryotherapy, Moist heat, Taping, Manual therapy, and Re-evaluation   PLAN FOR NEXT SESSION:  posture education, Rt shoulder strengthening and ROM; review and progress HEP.   Gwendolyn Grant, PT, DPT, ATC 05/18/22 11:44 AM

## 2022-05-23 ENCOUNTER — Encounter: Payer: Self-pay | Admitting: Physical Therapy

## 2022-05-23 ENCOUNTER — Ambulatory Visit: Payer: 59 | Admitting: Physical Therapy

## 2022-05-23 ENCOUNTER — Other Ambulatory Visit: Payer: Self-pay

## 2022-05-23 DIAGNOSIS — M6281 Muscle weakness (generalized): Secondary | ICD-10-CM

## 2022-05-23 DIAGNOSIS — G8929 Other chronic pain: Secondary | ICD-10-CM | POA: Diagnosis not present

## 2022-05-23 DIAGNOSIS — R293 Abnormal posture: Secondary | ICD-10-CM | POA: Diagnosis not present

## 2022-05-23 DIAGNOSIS — M542 Cervicalgia: Secondary | ICD-10-CM

## 2022-05-23 DIAGNOSIS — S46811A Strain of other muscles, fascia and tendons at shoulder and upper arm level, right arm, initial encounter: Secondary | ICD-10-CM | POA: Diagnosis not present

## 2022-05-23 DIAGNOSIS — M25511 Pain in right shoulder: Secondary | ICD-10-CM | POA: Diagnosis not present

## 2022-05-23 NOTE — Therapy (Signed)
OUTPATIENT PHYSICAL THERAPY TREATMENT NOTE   Patient Name: Amber Mccall MRN: [id number] DOB:March 28, 1951, 72 y.o., female Today's Date: 05/23/2022  PCP: Shelda Pal, DO  REFERRING PROVIDER: Leandrew Koyanagi, MD     END OF SESSION:   PT End of Session - 05/23/22 1136     Visit Number 6    Number of Visits 13    Date for PT Re-Evaluation 06/18/22    Authorization Type MCR/MCD    PT Start Time 1140    PT Stop Time 1220    PT Time Calculation (min) 40 min    Activity Tolerance Patient tolerated treatment well    Behavior During Therapy [facility] for tasks assessed/performed                 Past Medical History:  Diagnosis Date   Anginal pain ([place])    pt says she feels like this may be related to acid reflux   Closed fracture of metatarsal bone(s) 01/30/2013   Depression    Essential hypertension    GERD (gastroesophageal reflux disease)    Lumbar radiculopathy    Osteoarthritis    Osteoporosis    Past Surgical History:  Procedure Laterality Date   ABDOMINAL HYSTERECTOMY     APPENDECTOMY     CATARACT EXTRACTION Bilateral 2022   Cotton Osteotomy w/ Bone Graft Left 04/16/2015   Left foot   IR RADIOLOGIST EVAL & MGMT  04/11/2017   OPEN REDUCTION INTERNAL FIXATION (ORIF) DISTAL PHALANX Left 10/23/2014   5th Metatarsal   TOTAL KNEE ARTHROPLASTY Right 10/18/2021   Procedure: RIGHT TOTAL KNEE ARTHROPLASTY;  Surgeon: Leandrew Koyanagi, MD;  Location: [place];  Service: Orthopedics;  Laterality: Right;   Patient Active Problem List   Diagnosis Date Noted   Status post total right knee replacement 10/18/2021   Primary osteoarthritis of right knee 10/17/2021   Spondylosis without myelopathy or radiculopathy, lumbar region 09/28/2021   Chronic right shoulder pain 08/18/2021   Adhesive capsulitis of right shoulder 08/18/2021   Gastroesophageal reflux disease 08/18/2021   Primary osteoarthritis of left knee 07/27/2021   Primary osteoarthritis involving multiple joints  07/13/2021   Strain of right trapezius muscle 07/13/2021   Essential hypertension 05/19/2021   Hypertriglyceridemia 05/19/2021   Osteoporosis 05/19/2021   Lumbar radiculopathy 05/19/2021   Osteoarthritis 05/19/2021   Depression, major, single episode, in partial remission ([place]) 05/19/2021   Chronic right-sided low back pain 05/19/2021   Status post left foot surgery 04/23/2015   Injury of foot, left 04/08/2015   Sprain of ankle 04/08/2015   Deformity of metatarsal bone of left foot 04/08/2015   Metatarsalgia of left foot 02/10/2015   Tenosynovitis of foot 02/10/2015   Chronic pain of both knees 02/10/2015   Closed fracture of metatarsal bone 10/21/2014   Edema of left foot 10/21/2014   Closed fracture of metatarsal bone(s) 01/30/2013   Pain, foot 01/30/2013   Abnormal foot finding 01/30/2013    REFERRING DIAG: W09.811B (ICD-10-CM) - Strain of right trapezius muscle, initial encounter    THERAPY DIAG:  Chronic right shoulder pain  Cervicalgia  Muscle weakness (generalized)  Rationale for Evaluation and Treatment Rehabilitation  PERTINENT HISTORY: anginal pain, osteoporosis   PRECAUTIONS: none    SUBJECTIVE:  Video interpreter utilized SUBJECTIVE STATEMENT: Patient report she is feeling the same. She seems to be having more pain now than before therapy.  PAIN:  Are you having pain? Yes:  NPRS scale: 7-8/10 Pain location: Right upper trap Pain description: constant; pulling; stabbing  Aggravating factors: lifting;sleeping on the Rt side Relieving factors: massage gun   OBJECTIVE: (objective measures completed at initial evaluation unless otherwise dated)  POSTURE: Forward head, rounded shoulders    CERVICAL ROM:    Active ROM A/PROM (deg) eval 05/18/22  Flexion WNL WNL  Extension WNL  WNL pn  Right lateral flexion WNL WNL  Left lateral flexion WNL WNL  Right rotation WNL pn WNL  Left rotation WNL pn WNL   (Blank rows = not tested)   UPPER EXTREMITY ROM:    Active ROM Right eval Left eval 05/13/22 Right   Shoulder flexion 145   149 active; full passive  Shoulder extension       Shoulder abduction WNL      Shoulder adduction       Shoulder internal rotation L1 T6   Shoulder external rotation Top of head T2   Elbow flexion       Elbow extension       Wrist flexion       Wrist extension       Wrist ulnar deviation       Wrist radial deviation       Wrist pronation       Wrist supination       (Blank rows = not tested) pain about Rt upper trap with all Rt shoulder AROM   UPPER EXTREMITY MMT:   MMT Right eval Left eval  Shoulder flexion 4; pn shoulder shrug 4+  Shoulder extension      Shoulder abduction 4- pn; shoulder shrug 4+  Shoulder adduction      Shoulder internal rotation 5 5  Shoulder external rotation 5 5  Middle trapezius      Lower trapezius      Elbow flexion      Elbow extension      Wrist flexion      Wrist extension      Wrist ulnar deviation      Wrist radial deviation      Wrist pronation      Wrist supination      Grip strength (lbs)      (Blank rows = not tested)   PALPATION:  TTP and trigger points present Rt upper trap and levator scapulae    TODAY'S TREATMENT [facility] Adult PT Treatment:                                                DATE: 05/23/22 Therapeutic Exercise: UBE L1 x 4 min (2 fwd/bwd) while taking subjective Sidelying thoracic rotation x 10 each Sidelying shoulder abduction with 2# 2 x 8 Sidelying shoulder ER with 2# 2 x 10 Supine serratus punch with 2# 2 x 10 Supine shoulder horizontal abduction with red 2 x 10 Seated double ER and scap retraction with red 2 x 10 Seated upper trap stretch 2 x 15 sec each Seated chin tuck 2 x 10 Row with red 2 x 10 Extension with yellow 2 x 10   [facility] Adult PT Treatment:  DATE: 05/18/22 Therapeutic Exercise: UBE level 2 x 2 min each fwd/bwd Sidelying shoulder abduction 2 x 10; 2#  Sidelying shoulder ER 2 x 10; 2# Bilateral shoulder ER with retraction 2 x 10; red band  Resisted shoulder adduction red band 2 x 10 Manual Therapy: STM/DTM/Trigger point release Rt upper trap, levator scapulae, rhomboids, cervical paraspinals Rt scapular mobilizations superior/inferior, upward/downward rotation Gentle Rt GHJ oscillations   [facility] Adult PT Treatment:                                                DATE: 05/16/2022 Therapeutic Exercise: UBE L1 x 4 min (2 fwd/bwd) while taking subjective Seated upper trap and levator stretch 2 x 20 sec each Sidelying thoracic rotation stretch x 10 each Prone I and T x 10 each Seated double ER and scap retraction with red 2 x 10 Row with red 2 x 10 Extension with red 2 x 10 Updated HEP Manual Therapy: Skilled palpation and monitoring of muscle tension while performing TPDN treatment STM/DTM/trigger point Rt upper trap, levator scapulae Trigger Point Dry Needling Treatment: Pre-treatment instruction: Patient instructed on dry needling rationale, procedures, and possible side effects including pain during treatment (achy,cramping feeling), bruising, drop of blood, lightheadedness, nausea, sweating. Patient Consent Given: Yes Education handout provided: Previously provided Muscles treated: Rt upper trap and levator scap Needle size and number: .30x21m x 3 Treatment response/outcome: Twitch response elicited and Palpable decrease in muscle tension Post-treatment instructions: Patient instructed to expect possible mild to moderate muscle soreness later today and/or tomorrow. Patient instructed in methods to reduce muscle soreness and to continue prescribed HEP. If patient was dry needled over the lung field, patient was instructed on signs and symptoms of pneumothorax and, however unlikely,  to see immediate medical attention should they occur. Patient was also educated on signs and symptoms of infection and to seek medical attention should they occur. Patient verbalized understanding of these instructions and education.  PATIENT EDUCATION: Education details: HEP Person educated: Patient Education method: Explanation Education comprehension: verbalized understanding   HOME EXERCISE PROGRAM: Access Code: [id number]   ASSESSMENT: CLINICAL IMPRESSION: Patient tolerated therapy well with no adverse effects. Therapy focused primarily on therex to progress postural control. She continues to report no improvement in her symptoms with possible worsening of pain. Continued strengthening for rotator cuff and periscapular musculature, with patient requiring frequent cueing for proper exercise technique and posture. No changes made to HEP this visit. Will likely plan discharge at next visit due to lack of progress with PT.    OBJECTIVE IMPAIRMENTS: decreased activity tolerance, decreased knowledge of condition, decreased ROM, decreased strength, increased fascial restrictions, impaired flexibility, impaired UE functional use, improper body mechanics, postural dysfunction, and pain.    ACTIVITY LIMITATIONS: carrying, lifting, sleeping, reach over head, and hygiene/grooming   PARTICIPATION LIMITATIONS: meal prep, cleaning, laundry, shopping, and community activity   PERSONAL FACTORS: Age, Fitness, Past/current experiences, Profession, Time since onset of injury/illness/exacerbation, Transportation, and language barrier  are also affecting patient's functional outcome.      GOALS: Goals reviewed with patient? Yes   SHORT TERM GOALS: Target date: 05/25/2022   Patient will be independent and compliant with initial HEP.  Baseline: issued at eval  Goal status: INITIAL   2.  Patient will demonstrate proper seated posture to reduce stress about her neck and shoulders.  Baseline: see  above   Goal status: INITIAL   3.  Patient will demonstrate at least 160 degrees of Rt shoulder flexion AROM to improve ability to reach into overhead cabinets.  Baseline: see above  Goal status: ongoing   4.  Patient will demonstrate pain free cervical rotation AROM indicative of improvement in her current condition.  Baseline: see above  Goal status: ongoing    LONG TERM GOALS: Target date: 06/15/2022   Patient will be able to lift at least 5 lbs into overhead cabinet without shoulder shrug and no increase in pain.  Baseline: shoulder shrug and pain with flexion MMT Goal status: INITIAL   2.  Patient will be able to lift and carry 10 lbs with proper form without an increase in pain to improve ease of lifting and carrying laundry.  Baseline: increased pain with lifting.  Goal status: INITIAL   3.  Patient will report pain as </=5/10 to reduce her current functional limitations.  Baseline: 8 Goal status: INITIAL   4.  Patient will be independent with advanced home program to progress/maintain current level of function.  Baseline: initial HEP issued  Goal status: INITIAL   5.  Patient will be able to reach the base of her occiput with ER functional reach to improve ability to complete self-care activities.  Baseline: see above  Goal status: INITIAL     PLAN: PT FREQUENCY: 1-2x/week   PT DURATION: 6 weeks   PLANNED INTERVENTIONS: Therapeutic exercises, Therapeutic activity, Neuromuscular re-education, Patient/Family education, Self Care, Dry Needling, Electrical stimulation, Cryotherapy, Moist heat, Taping, Manual therapy, and Re-evaluation   PLAN FOR NEXT SESSION:  posture education, Rt shoulder strengthening and ROM; review and progress HEP.    Hilda Blades, PT, DPT, LAT, ATC 05/23/22  12:22 PM Phone: [phone] Fax: [phone]

## 2022-05-25 ENCOUNTER — Encounter: Payer: Self-pay | Admitting: Orthopaedic Surgery

## 2022-05-25 ENCOUNTER — Ambulatory Visit: Payer: 59

## 2022-05-25 DIAGNOSIS — S46811A Strain of other muscles, fascia and tendons at shoulder and upper arm level, right arm, initial encounter: Secondary | ICD-10-CM | POA: Diagnosis not present

## 2022-05-25 DIAGNOSIS — M542 Cervicalgia: Secondary | ICD-10-CM

## 2022-05-25 DIAGNOSIS — M25511 Pain in right shoulder: Secondary | ICD-10-CM | POA: Diagnosis not present

## 2022-05-25 DIAGNOSIS — R293 Abnormal posture: Secondary | ICD-10-CM

## 2022-05-25 DIAGNOSIS — G8929 Other chronic pain: Secondary | ICD-10-CM | POA: Diagnosis not present

## 2022-05-25 DIAGNOSIS — M6281 Muscle weakness (generalized): Secondary | ICD-10-CM

## 2022-05-25 NOTE — Therapy (Signed)
OUTPATIENT PHYSICAL THERAPY TREATMENT NOTE  PHYSICAL THERAPY DISCHARGE SUMMARY  Visits from Start of Care: 7  Current functional level related to goals / functional outcomes: See goals below   Remaining deficits: See impression below   Education / Equipment: See education below    Patient agrees to discharge. Patient goals were partially met. Patient is being discharged due to did not respond to therapy.   Patient Name: Amber Mccall MRN: [id number] DOB:26-Mar-1951, 72 y.o., female Today's Date: 05/25/2022  PCP: Shelda Pal, DO  REFERRING PROVIDER: Leandrew Koyanagi, MD     END OF SESSION:   PT End of Session - 05/25/22 1100     Visit Number 7    Number of Visits 13    Date for PT Re-Evaluation 06/18/22    Authorization Type MCR/MCD    PT Start Time 1100    PT Stop Time 1128    PT Time Calculation (min) 28 min    Activity Tolerance Patient tolerated treatment well    Behavior During Therapy [facility] for tasks assessed/performed                  Past Medical History:  Diagnosis Date   Anginal pain ([place])    pt says she feels like this may be related to acid reflux   Closed fracture of metatarsal bone(s) 01/30/2013   Depression    Essential hypertension    GERD (gastroesophageal reflux disease)    Lumbar radiculopathy    Osteoarthritis    Osteoporosis    Past Surgical History:  Procedure Laterality Date   ABDOMINAL HYSTERECTOMY     APPENDECTOMY     CATARACT EXTRACTION Bilateral 2022   [place] Osteotomy w/ Bone Graft Left 04/16/2015   Left foot   IR RADIOLOGIST EVAL & MGMT  04/11/2017   OPEN REDUCTION INTERNAL FIXATION (ORIF) DISTAL PHALANX Left 10/23/2014   5th Metatarsal   TOTAL KNEE ARTHROPLASTY Right 10/18/2021   Procedure: RIGHT TOTAL KNEE ARTHROPLASTY;  Surgeon: Leandrew Koyanagi, MD;  Location: [facility];  Service: Orthopedics;  Laterality: Right;   Patient Active Problem List   Diagnosis Date Noted   Status post total right knee replacement  10/18/2021   Primary osteoarthritis of right knee 10/17/2021   Spondylosis without myelopathy or radiculopathy, lumbar region 09/28/2021   Chronic right shoulder pain 08/18/2021   Adhesive capsulitis of right shoulder 08/18/2021   Gastroesophageal reflux disease 08/18/2021   Primary osteoarthritis of left knee 07/27/2021   Primary osteoarthritis involving multiple joints 07/13/2021   Strain of right trapezius muscle 07/13/2021   Essential hypertension 05/19/2021   Hypertriglyceridemia 05/19/2021   Osteoporosis 05/19/2021   Lumbar radiculopathy 05/19/2021   Osteoarthritis 05/19/2021   Depression, major, single episode, in partial remission ([place]) 05/19/2021   Chronic right-sided low back pain 05/19/2021   Status post left foot surgery 04/23/2015   Injury of foot, left 04/08/2015   Sprain of ankle 04/08/2015   Deformity of metatarsal bone of left foot 04/08/2015   Metatarsalgia of left foot 02/10/2015   Tenosynovitis of foot 02/10/2015   Chronic pain of both knees 02/10/2015   Closed fracture of metatarsal bone 10/21/2014   Edema of left foot 10/21/2014   Closed fracture of metatarsal bone(s) 01/30/2013   Pain, foot 01/30/2013   Abnormal foot finding 01/30/2013    REFERRING DIAG: U11.031R (ICD-10-CM) - Strain of right trapezius muscle, initial encounter    THERAPY DIAG:  Chronic right shoulder pain  Cervicalgia  Muscle weakness (generalized)  Abnormal  posture  Rationale for Evaluation and Treatment Rehabilitation  PERTINENT HISTORY: anginal pain, osteoporosis   PRECAUTIONS: none    SUBJECTIVE:                                                                                                                                                                                     In-person interpreter utilized SUBJECTIVE STATEMENT: Patient report she is still hurting and feels that her pain is worsening in the Rt upper trap.   PAIN:  Are you having pain? Yes:  NPRS scale:  8/10 Pain location: Right upper trap Pain description: constant; pulling; stabbing  Aggravating factors: lifting;sleeping on the Rt side Relieving factors: massage gun   OBJECTIVE: (objective measures completed at initial evaluation unless otherwise dated)  POSTURE: Forward head, rounded shoulders    CERVICAL ROM:    Active ROM A/PROM (deg) eval 05/18/22 05/25/22  Flexion WNL WNL WNL  Extension WNL WNL pn WNL  Right lateral flexion WNL WNL WNL  Left lateral flexion WNL WNL WNL  Right rotation WNL pn WNL WNL  Left rotation WNL pn WNL WNL   (Blank rows = not tested)   UPPER EXTREMITY ROM:    Active ROM Right eval Left eval 05/13/22 Right  05/25/22 Right   Shoulder flexion 145   149 active; full passive 145 pn  Shoulder extension        Shoulder abduction WNL     150 pn  Shoulder adduction        Shoulder internal rotation L1 T6  T10  Shoulder external rotation Top of head T2  T2  Elbow flexion        Elbow extension        Wrist flexion        Wrist extension        Wrist ulnar deviation        Wrist radial deviation        Wrist pronation        Wrist supination        (Blank rows = not tested) pain about Rt upper trap with all Rt shoulder AROM   UPPER EXTREMITY MMT:   MMT Right eval Left eval 05/25/22  Shoulder flexion 4; pn shoulder shrug 4+ 4+ bilateral  Shoulder extension       Shoulder abduction 4- pn; shoulder shrug 4+ 4+ bilateral shoulder shrug  Shoulder adduction       Shoulder internal rotation '5 5 5  '$ Shoulder external rotation '5 5 5  '$ Middle trapezius       Lower trapezius       Elbow flexion       Elbow extension  Wrist flexion       Wrist extension       Wrist ulnar deviation       Wrist radial deviation       Wrist pronation       Wrist supination       Grip strength (lbs)       (Blank rows = not tested)   PALPATION:  TTP and trigger points present Rt upper trap and levator scapulae    TODAY'S TREATMENT [facility] Adult PT Treatment:                                                 DATE: 05/25/22 Therapeutic Exercise: Reviewed HEP demonstrating and returning demo as needed of exercises; discussing frequency, sets, reps, and ways to progress independently.   Therapeutic Activity: Re-assessment to determine overall progress, educating patient on assessment findings.     [facility] Adult PT Treatment:                                                DATE: 05/23/22 Therapeutic Exercise: UBE L1 x 4 min (2 fwd/bwd) while taking subjective Sidelying thoracic rotation x 10 each Sidelying shoulder abduction with 2# 2 x 8 Sidelying shoulder ER with 2# 2 x 10 Supine serratus punch with 2# 2 x 10 Supine shoulder horizontal abduction with red 2 x 10 Seated double ER and scap retraction with red 2 x 10 Seated upper trap stretch 2 x 15 sec each Seated chin tuck 2 x 10 Row with red 2 x 10 Extension with yellow 2 x 10   [facility] Adult PT Treatment:                                                DATE: 05/18/22 Therapeutic Exercise: UBE level 2 x 2 min each fwd/bwd Sidelying shoulder abduction 2 x 10; 2#  Sidelying shoulder ER 2 x 10; 2# Bilateral shoulder ER with retraction 2 x 10; red band  Resisted shoulder adduction red band 2 x 10 Manual Therapy: STM/DTM/Trigger point release Rt upper trap, levator scapulae, rhomboids, cervical paraspinals Rt scapular mobilizations superior/inferior, upward/downward rotation Gentle Rt GHJ oscillations   PATIENT EDUCATION: Education details: see treatment;d/c education Person educated: Patient Education method: Explanation, demo, cues, handout Education comprehension: verbalized understanding, returned demo    HOME EXERCISE PROGRAM: Access Code: [email or web address]    ASSESSMENT: CLINICAL IMPRESSION: Avonda has attended 7 PT sessions reporting a worsening of her Rt upper trap pain since beginning PT. She demonstrates full cervical AROM without an increase in pain, but has limited and painful shoulder flexion and  abduction AROM. Due to her lack of progress and ongoing pain she is appropriate for discharge from PT with recommendation to f/u with referring provider for further assessment/treatment with patient in agreement with this plan.     OBJECTIVE IMPAIRMENTS: decreased activity tolerance, decreased knowledge of condition, decreased ROM, decreased strength, increased fascial restrictions, impaired flexibility, impaired UE functional use, improper body mechanics, postural dysfunction, and pain.    ACTIVITY LIMITATIONS: carrying, lifting, sleeping, reach over head, and hygiene/grooming  PARTICIPATION LIMITATIONS: meal prep, cleaning, laundry, shopping, and community activity   PERSONAL FACTORS: Age, Fitness, Past/current experiences, Profession, Time since onset of injury/illness/exacerbation, Transportation, and language barrier  are also affecting patient's functional outcome.      GOALS: Goals reviewed with patient? Yes   SHORT TERM GOALS: Target date: 05/25/2022   Patient will be independent and compliant with initial HEP.  Baseline: issued at eval  Goal status: met   2.  Patient will demonstrate proper seated posture to reduce stress about her neck and shoulders.  Baseline: see above  Goal status: met   3.  Patient will demonstrate at least 160 degrees of Rt shoulder flexion AROM to improve ability to reach into overhead cabinets.  Baseline: see above  Goal status: not met   4.  Patient will demonstrate pain free cervical rotation AROM indicative of improvement in her current condition.  Baseline: see above  Goal status: met    LONG TERM GOALS: Target date: 06/15/2022   Patient will be able to lift at least 5 lbs into overhead cabinet without shoulder shrug and no increase in pain.  Baseline: shoulder shrug and pain with flexion MMT Goal status: not met   2.  Patient will be able to lift and carry 10 lbs with proper form without an increase in pain to improve ease of lifting and  carrying laundry.  Baseline: increased pain with lifting.  Goal status: not met   3.  Patient will report pain as </=5/10 to reduce her current functional limitations.  Baseline: 8 Goal status: not met   4.  Patient will be independent with advanced home program to progress/maintain current level of function.  Baseline: initial HEP issued  Goal status: met   5.  Patient will be able to reach the base of her occiput with ER functional reach to improve ability to complete self-care activities.  Baseline: see above  Goal status: met     PLAN: PT FREQUENCY: n/a   PT DURATION: n/a   PLANNED INTERVENTIONS: Therapeutic exercises, Therapeutic activity, Neuromuscular re-education, Patient/Family education, Self Care, Dry Needling, Electrical stimulation, Cryotherapy, Moist heat, Taping, Manual therapy, and Re-evaluation   PLAN FOR NEXT SESSION:  n/a  Gwendolyn Grant, PT, DPT, ATC 05/25/22 11:37 AM

## 2022-05-26 ENCOUNTER — Other Ambulatory Visit: Payer: Self-pay

## 2022-05-26 DIAGNOSIS — M5412 Radiculopathy, cervical region: Secondary | ICD-10-CM

## 2022-06-13 ENCOUNTER — Other Ambulatory Visit: Payer: Self-pay | Admitting: Physician Assistant

## 2022-06-13 MED ORDER — DOCUSATE SODIUM 100 MG PO CAPS: 100.0000 mg | ORAL_CAPSULE | Freq: Every day | ORAL | 2 refills | Status: AC | PRN

## 2022-06-13 MED ORDER — METHOCARBAMOL 750 MG PO TABS: 750.0000 mg | ORAL_TABLET | Freq: Two times a day (BID) | ORAL | 2 refills | Status: DC | PRN

## 2022-06-13 MED ORDER — ASPIRIN 81 MG PO TBEC: 81.0000 mg | DELAYED_RELEASE_TABLET | Freq: Two times a day (BID) | ORAL | 0 refills | Status: DC

## 2022-06-13 MED ORDER — ONDANSETRON HCL 4 MG PO TABS: 4.0000 mg | ORAL_TABLET | Freq: Three times a day (TID) | ORAL | 0 refills | Status: DC | PRN

## 2022-06-13 MED ORDER — OXYCODONE-ACETAMINOPHEN 5-325 MG PO TABS: 1.0000 | ORAL_TABLET | Freq: Four times a day (QID) | ORAL | 0 refills | Status: DC | PRN

## 2022-06-13 NOTE — Pre-Procedure Instructions (Signed)
Surgical Instructions    Your procedure is scheduled on Monday, February 26th.  Report to [facility] Main Entrance "A" at 10:15 A.M., then check in with the Admitting office.  Call this number if you have problems the morning of surgery:  [phone]  If you have any questions prior to your surgery date call [phone]: Open Monday-Friday 8am-4pm If you experience any cold or flu symptoms such as cough, fever, chills, shortness of breath, etc. between now and your scheduled surgery, please notify us at the above number.     Remember:  Do not eat after midnight the night before your surgery  You may drink clear liquids until 9:45 a.m. the morning of your surgery.   Clear liquids allowed are: Water, Non-Citrus Juices (without pulp), Carbonated Beverages, Clear Tea, Black Coffee Only (NO MILK, CREAM OR POWDERED CREAMER of any kind), and Gatorade.    Enhanced Recovery after Surgery for Orthopedics Enhanced Recovery after Surgery is a protocol used to improve the stress on your body and your recovery after surgery.  Patient Instructions  The day of surgery (if you do NOT have diabetes):  Drink ONE (1) Pre-Surgery Clear Ensure by 9:45 am the morning of surgery   This drink was given to you during your hospital  pre-op appointment visit. Nothing else to drink after completing the  Pre-Surgery Clear Ensure.   Take these medicines the morning of surgery with A SIP OF WATER  amLODipine (NORVASC)  gabapentin (NEURONTIN)  pantoprazole (PROTONIX)    Take these medications AS NEEDED: HYDROcodone-acetaminophen (NORCO/VICODIN)  tiZANidine (ZANAFLEX)  traMADol (ULTRAM)   As of today, STOP taking any Aspirin (unless otherwise instructed by your surgeon) Aleve, Naproxen, Ibuprofen, Motrin, Advil, Goody's, BC's, all herbal medications, fish oil, and all vitamins. This includes: diclofenac Sodium (VOLTAREN) 1 % GEL.                     Do NOT Smoke (Tobacco/Vaping) for 24 hours prior to  your procedure.  If you use a CPAP at night, you may bring your mask/headgear for your overnight stay.   Contacts, glasses, piercing's, hearing aid's, dentures or partials may not be worn into surgery, please bring cases for these belongings.    For patients admitted to the hospital, discharge time will be determined by your treatment team.   Patients discharged the day of surgery will not be allowed to drive home, and someone needs to stay with them for 24 hours.  SURGICAL WAITING ROOM VISITATION Patients having surgery or a procedure may have no more than 2 support people in the waiting area - these visitors may rotate.   Children under the age of 7 must have an adult with them who is not the patient. If the patient needs to stay at the hospital during part of their recovery, the visitor guidelines for inpatient rooms apply. Pre-op nurse will coordinate an appropriate time for 1 support person to accompany patient in pre-op.  This support person may not rotate.   Please refer to the [facility] website for the visitor guidelines for Inpatients (after your surgery is over and you are in a regular room).    Special instructions:   [place]- Preparing For Surgery  Before surgery, you can play an important role. Because skin is not sterile, your skin needs to be as free of germs as possible. You can reduce the number of germs on your skin by washing with CHG (chlorahexidine gluconate) Soap before surgery.  CHG is  an antiseptic cleaner which kills germs and bonds with the skin to continue killing germs even after washing.    Oral Hygiene is also important to reduce your risk of infection.  Remember - BRUSH YOUR TEETH THE MORNING OF SURGERY WITH YOUR REGULAR TOOTHPASTE  Please do not use if you have an allergy to CHG or antibacterial soaps. If your skin becomes reddened/irritated stop using the CHG.  Do not shave (including legs and underarms) for at least 48 hours prior to first CHG  shower. It is OK to shave your face.  Please follow these instructions carefully.   Shower the NIGHT BEFORE SURGERY and the MORNING OF SURGERY  If you chose to wash your hair, wash your hair first as usual with your normal shampoo.  After you shampoo, rinse your hair and body thoroughly to remove the shampoo.  Use CHG Soap as you would any other liquid soap. You can apply CHG directly to the skin and wash gently with a scrungie or a clean washcloth.   Apply the CHG Soap to your body ONLY FROM THE NECK DOWN.  Do not use on open wounds or open sores. Avoid contact with your eyes, ears, mouth and genitals (private parts). Wash Face and genitals (private parts)  with your normal soap.   Wash thoroughly, paying special attention to the area where your surgery will be performed.  Thoroughly rinse your body with warm water from the neck down.  DO NOT shower/wash with your normal soap after using and rinsing off the CHG Soap.  Pat yourself dry with a CLEAN TOWEL.  Wear CLEAN PAJAMAS to bed the night before surgery  Place CLEAN SHEETS on your bed the night before your surgery  DO NOT SLEEP WITH PETS.   Day of Surgery: Take a shower with CHG soap. Do not wear jewelry or makeup Do not wear lotions, powders, perfumes, or deodorant. Do not shave 48 hours prior to surgery.  Do not bring valuables to the hospital.  [facility] is not responsible for any belongings or valuables. Do not wear nail polish, gel polish, artificial nails, or any other type of covering on natural nails (fingers and toes) If you have artificial nails or gel coating that need to be removed by a nail salon, please have this removed prior to surgery. Artificial nails or gel coating may interfere with anesthesia's ability to adequately monitor your vital signs. Wear Clean/Comfortable clothing the morning of surgery Remember to brush your teeth WITH YOUR REGULAR TOOTHPASTE.   Please read over the following fact sheets  that you were given.    If you received a COVID test during your pre-op visit  it is requested that you wear a mask when out in public, stay away from anyone that may not be feeling well and notify your surgeon if you develop symptoms. If you have been in contact with anyone that has tested positive in the last 10 days please notify you surgeon.

## 2022-06-14 ENCOUNTER — Encounter (HOSPITAL_COMMUNITY)
Admission: RE | Admit: 2022-06-14 | Discharge: 2022-06-14 | Disposition: A | Discharge status: Home / Self Care | Payer: 59 | Source: Ambulatory Visit | Attending: Orthopaedic Surgery | Admitting: Orthopaedic Surgery

## 2022-06-14 ENCOUNTER — Encounter: Payer: Self-pay | Admitting: Orthopedic Surgery

## 2022-06-14 ENCOUNTER — Other Ambulatory Visit: Payer: Self-pay

## 2022-06-14 ENCOUNTER — Encounter (HOSPITAL_COMMUNITY): Payer: Self-pay

## 2022-06-14 ENCOUNTER — Encounter: Payer: Self-pay | Admitting: Orthopaedic Surgery

## 2022-06-14 VITALS — BP 141/63 | HR 89 | Temp 97.7°F | Resp 16 | Ht 60.0 in | Wt 140.0 lb

## 2022-06-14 DIAGNOSIS — M1712 Unilateral primary osteoarthritis, left knee: Secondary | ICD-10-CM | POA: Diagnosis not present

## 2022-06-14 DIAGNOSIS — Z01812 Encounter for preprocedural laboratory examination: Secondary | ICD-10-CM | POA: Insufficient documentation

## 2022-06-14 DIAGNOSIS — Z01818 Encounter for other preprocedural examination: Secondary | ICD-10-CM

## 2022-06-14 LAB — CBC
HCT: 37.4 % (ref 36.0–46.0)
Hemoglobin: 11.9 g/dL — ABNORMAL LOW (ref 12.0–15.0)
MCH: 29.1 pg (ref 26.0–34.0)
MCHC: 31.8 g/dL (ref 30.0–36.0)
MCV: 91.4 fL (ref 80.0–100.0)
Platelets: 221 10*3/uL (ref 150–400)
RBC: 4.09 MIL/uL (ref 3.87–5.11)
RDW: 12.4 % (ref 11.5–15.5)
WBC: 4.8 10*3/uL (ref 4.0–10.5)
nRBC: 0 % (ref 0.0–0.2)

## 2022-06-14 LAB — BASIC METABOLIC PANEL
Anion gap: 10 (ref 5–15)
BUN: 14 mg/dL (ref 8–23)
CO2: 24 mmol/L (ref 22–32)
Calcium: 9 mg/dL (ref 8.9–10.3)
Chloride: 104 mmol/L (ref 98–111)
Creatinine, Ser: 0.76 mg/dL (ref 0.44–1.00)
GFR, Estimated: >60 mL/min (ref >60)
Glucose, Bld: 107 mg/dL — ABNORMAL HIGH (ref 70–99)
Potassium: 3.9 mmol/L (ref 3.5–5.1)
Sodium: 138 mmol/L (ref 135–145)

## 2022-06-14 LAB — SURGICAL PCR SCREEN
MRSA, PCR: NEGATIVE
Staphylococcus aureus: NEGATIVE

## 2022-06-14 NOTE — Pre-Procedure Instructions (Signed)
Surgical Instructions    Your procedure is scheduled on Monday, February 26th.  Report to [facility] Main Entrance "A" at 10:15 A.M., then check in with the Admitting office.  Call this number if you have problems the morning of surgery:  [phone]  If you have any questions prior to your surgery date call [phone]: Open Monday-Friday 8am-4pm If you experience any cold or flu symptoms such as cough, fever, chills, shortness of breath, etc. between now and your scheduled surgery, please notify us at the above number.     Remember:  Do not eat after midnight the night before your surgery  You may drink clear liquids until 9:45 a.m. the morning of your surgery.   Clear liquids allowed are: Water, Non-Citrus Juices (without pulp), Carbonated Beverages, Clear Tea, Black Coffee Only (NO MILK, CREAM OR POWDERED CREAMER of any kind), and Gatorade.    Enhanced Recovery after Surgery for Orthopedics Enhanced Recovery after Surgery is a protocol used to improve the stress on your body and your recovery after surgery.  Patient Instructions  The day of surgery (if you do NOT have diabetes):  Drink ONE (1) Pre-Surgery Clear Ensure by 9:45 am the morning of surgery   This drink was given to you during your hospital  pre-op appointment visit. Nothing else to drink after completing the  Pre-Surgery Clear Ensure.   Take these medicines the morning of surgery with A SIP OF WATER  AmLODipine (NORVASC)  Gabapentin (NEURONTIN)  Pantoprazole (PROTONIX)  Sertraline (ZOLOFT)   As of today, STOP taking any Aspirin (unless otherwise instructed by your surgeon) Aleve, Naproxen, Ibuprofen, Motrin, Advil, Goody's, BC's, all herbal medications, fish oil, and all vitamins. This includes: diclofenac Sodium (VOLTAREN) 1 % GEL.                     Do NOT Smoke (Tobacco/Vaping) for 24 hours prior to your procedure.  If you use a CPAP at night, you may bring your mask/headgear for your overnight stay.    Contacts, glasses, piercing's, hearing aid's, dentures or partials may not be worn into surgery, please bring cases for these belongings.    For patients admitted to the hospital, discharge time will be determined by your treatment team.   Patients discharged the day of surgery will not be allowed to drive home, and someone needs to stay with them for 24 hours.  SURGICAL WAITING ROOM VISITATION Patients having surgery or a procedure may have no more than 2 support people in the waiting area - these visitors may rotate.   Children under the age of 77 must have an adult with them who is not the patient. If the patient needs to stay at the hospital during part of their recovery, the visitor guidelines for inpatient rooms apply. Pre-op nurse will coordinate an appropriate time for 1 support person to accompany patient in pre-op.  This support person may not rotate.   Please refer to the [facility] website for the visitor guidelines for Inpatients (after your surgery is over and you are in a regular room).    Special instructions:   [facility]- Preparing For Surgery  Before surgery, you can play an important role. Because skin is not sterile, your skin needs to be as free of germs as possible. You can reduce the number of germs on your skin by washing with CHG (chlorahexidine gluconate) Soap before surgery.  CHG is an antiseptic cleaner which kills germs and bonds with the skin to continue  killing germs even after washing.    Oral Hygiene is also important to reduce your risk of infection.  Remember - BRUSH YOUR TEETH THE MORNING OF SURGERY WITH YOUR REGULAR TOOTHPASTE  Please do not use if you have an allergy to CHG or antibacterial soaps. If your skin becomes reddened/irritated stop using the CHG.  Do not shave (including legs and underarms) for at least 48 hours prior to first CHG shower. It is OK to shave your face.  Please follow these instructions carefully.   Shower the NIGHT BEFORE  SURGERY and the MORNING OF SURGERY  If you chose to wash your hair, wash your hair first as usual with your normal shampoo.  After you shampoo, rinse your hair and body thoroughly to remove the shampoo.  Use CHG Soap as you would any other liquid soap. You can apply CHG directly to the skin and wash gently with a scrungie or a clean washcloth.   Apply the CHG Soap to your body ONLY FROM THE NECK DOWN.  Do not use on open wounds or open sores. Avoid contact with your eyes, ears, mouth and genitals (private parts). Wash Face and genitals (private parts)  with your normal soap.   Wash thoroughly, paying special attention to the area where your surgery will be performed.  Thoroughly rinse your body with warm water from the neck down.  DO NOT shower/wash with your normal soap after using and rinsing off the CHG Soap.  Pat yourself dry with a CLEAN TOWEL.  Wear CLEAN PAJAMAS to bed the night before surgery  Place CLEAN SHEETS on your bed the night before your surgery  DO NOT SLEEP WITH PETS.   Day of Surgery: Take a shower with CHG soap. Do not wear jewelry or makeup Do not wear lotions, powders, perfumes, or deodorant. Do not shave 48 hours prior to surgery.  Do not bring valuables to the hospital.  [facility] is not responsible for any belongings or valuables. Do not wear nail polish, gel polish, artificial nails, or any other type of covering on natural nails (fingers and toes) If you have artificial nails or gel coating that need to be removed by a nail salon, please have this removed prior to surgery. Artificial nails or gel coating may interfere with anesthesia's ability to adequately monitor your vital signs. Wear Clean/Comfortable clothing the morning of surgery Remember to brush your teeth WITH YOUR REGULAR TOOTHPASTE.   Please read over the following fact sheets that you were given.    If you received a COVID test during your pre-op visit  it is requested that you wear a  mask when out in public, stay away from anyone that may not be feeling well and notify your surgeon if you develop symptoms. If you have been in contact with anyone that has tested positive in the last 10 days please notify you surgeon.

## 2022-06-14 NOTE — Progress Notes (Signed)
PCP - Dr. Riki Sheer Cardiologist - denies  Speaks Korean--Interpreter, Sunmi, present during PAT appt   PPM/ICD - denies    Chest x-ray - n/a EKG - 10/29/21 Stress Test - 08/27/14 ECHO - denies Cardiac Cath - denies   Sleep Study - denies     DM- denies   ASA/Blood Thinner Instructions: n/a     ERAS Protcol - yes PRE-SURGERY Ensure given at PAT   COVID TEST- n/a     Anesthesia review: no   Patient denies shortness of breath, fever, cough and chest pain at PAT appointment     All instructions explained to the patient, with a verbal understanding of the material. Patient agrees to go over the instructions while at home for a better understanding. Patient also instructed to notify surgeon of any contact with COVID+ person or if she develops any symptoms. The opportunity to ask questions was provided.

## 2022-06-16 ENCOUNTER — Ambulatory Visit
Admission: RE | Admit: 2022-06-16 | Discharge: 2022-06-16 | Disposition: A | Discharge status: Home / Self Care | Payer: 59 | Source: Ambulatory Visit | Attending: Orthopaedic Surgery | Admitting: Orthopaedic Surgery

## 2022-06-16 DIAGNOSIS — M542 Cervicalgia: Secondary | ICD-10-CM | POA: Diagnosis not present

## 2022-06-16 DIAGNOSIS — M5412 Radiculopathy, cervical region: Secondary | ICD-10-CM

## 2022-06-17 ENCOUNTER — Telehealth: Payer: Self-pay | Admitting: *Deleted

## 2022-06-17 MED ORDER — TRANEXAMIC ACID 1000 MG/10ML IV SOLN
2000.0000 mg | INTRAVENOUS | Status: DC
  Filled 2022-06-17: qty 20

## 2022-06-17 NOTE — Care Plan (Signed)
[facility] RNCM call to patient's son, who she lives with and assists with interpretation for his mother. We discussed her upcoming Left total knee arthroplasty with Dr. Erlinda Hong on 06/20/22. She is an Ortho bundle patient through [facility] and is agreeable to case management. She will have assistance from her son after surgery. Anticipate HHPT will be needed after a short hospital stay. Referral made to [facility] after choice provided. Reviewed medications that have been sent to pharmacy already by PA. Also she has a home CPM, RW delivered by [employer] prior to surgery. No DME needs. Will continue to follow for needs.

## 2022-06-17 NOTE — Telephone Encounter (Signed)
Ortho bundle pre-op call completed. 

## 2022-06-20 ENCOUNTER — Ambulatory Visit (HOSPITAL_BASED_OUTPATIENT_CLINIC_OR_DEPARTMENT_OTHER): Payer: 59 | Admitting: Anesthesiology

## 2022-06-20 ENCOUNTER — Observation Stay (HOSPITAL_COMMUNITY): Payer: 59

## 2022-06-20 ENCOUNTER — Ambulatory Visit (HOSPITAL_COMMUNITY): Payer: 59 | Admitting: Anesthesiology

## 2022-06-20 ENCOUNTER — Encounter (HOSPITAL_COMMUNITY): Payer: Self-pay | Admitting: Orthopaedic Surgery

## 2022-06-20 ENCOUNTER — Other Ambulatory Visit: Payer: Self-pay

## 2022-06-20 ENCOUNTER — Observation Stay (HOSPITAL_COMMUNITY)
Admission: RE | Admit: 2022-06-20 | Discharge: 2022-06-21 | Disposition: A | Discharge status: Home / Self Care | Payer: 59 | Source: Ambulatory Visit | Attending: Orthopaedic Surgery | Admitting: Orthopaedic Surgery

## 2022-06-20 ENCOUNTER — Encounter (HOSPITAL_COMMUNITY)
Admission: RE | Disposition: A | Discharge status: Home / Self Care | Payer: Self-pay | Source: Ambulatory Visit | Attending: Orthopaedic Surgery

## 2022-06-20 DIAGNOSIS — M1712 Unilateral primary osteoarthritis, left knee: Secondary | ICD-10-CM | POA: Diagnosis not present

## 2022-06-20 DIAGNOSIS — Z7982 Long term (current) use of aspirin: Secondary | ICD-10-CM | POA: Insufficient documentation

## 2022-06-20 DIAGNOSIS — Z79899 Other long term (current) drug therapy: Secondary | ICD-10-CM | POA: Diagnosis not present

## 2022-06-20 DIAGNOSIS — I10 Essential (primary) hypertension: Secondary | ICD-10-CM | POA: Insufficient documentation

## 2022-06-20 DIAGNOSIS — Z96651 Presence of right artificial knee joint: Secondary | ICD-10-CM | POA: Insufficient documentation

## 2022-06-20 DIAGNOSIS — Z96652 Presence of left artificial knee joint: Secondary | ICD-10-CM | POA: Diagnosis not present

## 2022-06-20 DIAGNOSIS — G8918 Other acute postprocedural pain: Secondary | ICD-10-CM | POA: Diagnosis not present

## 2022-06-20 DIAGNOSIS — M94262 Chondromalacia, left knee: Secondary | ICD-10-CM | POA: Diagnosis not present

## 2022-06-20 DIAGNOSIS — Z471 Aftercare following joint replacement surgery: Secondary | ICD-10-CM | POA: Diagnosis not present

## 2022-06-20 HISTORY — PX: TOTAL KNEE ARTHROPLASTY: SHX125

## 2022-06-20 SURGERY — ARTHROPLASTY, KNEE, TOTAL
Anesthesia: Regional | Site: Knee | Laterality: Left

## 2022-06-20 MED ORDER — DEXAMETHASONE SODIUM PHOSPHATE 10 MG/ML IJ SOLN
10.0000 mg | Freq: Once | INTRAMUSCULAR | Status: AC
  Administered 2022-06-21: 10 mg via INTRAVENOUS
  Filled 2022-06-20: qty 1

## 2022-06-20 MED ORDER — METOCLOPRAMIDE HCL 5 MG/ML IJ SOLN: 5.0000 mg | Freq: Three times a day (TID) | INTRAMUSCULAR | Status: DC | PRN

## 2022-06-20 MED ORDER — TRANEXAMIC ACID-NACL 1000-0.7 MG/100ML-% IV SOLN
1000.0000 mg | Freq: Once | INTRAVENOUS | Status: AC
  Administered 2022-06-20: 1000 mg via INTRAVENOUS
  Filled 2022-06-20: qty 100

## 2022-06-20 MED ORDER — METHOCARBAMOL 1000 MG/10ML IJ SOLN: 500.0000 mg | Freq: Four times a day (QID) | INTRAVENOUS | Status: DC | PRN

## 2022-06-20 MED ORDER — BUPIVACAINE-EPINEPHRINE (PF) 0.5% -1:200000 IJ SOLN
INTRAMUSCULAR | Status: DC | PRN
  Administered 2022-06-20: 25 mL via PERINEURAL

## 2022-06-20 MED ORDER — OXYCODONE HCL 5 MG PO TABS
5.0000 mg | ORAL_TABLET | ORAL | Status: DC | PRN
  Administered 2022-06-20: 10 mg via ORAL
  Filled 2022-06-20 (×2): qty 2

## 2022-06-20 MED ORDER — OXYCODONE HCL 5 MG PO TABS
10.0000 mg | ORAL_TABLET | ORAL | Status: DC | PRN
  Administered 2022-06-21: 10 mg via ORAL
  Administered 2022-06-21: 15 mg via ORAL
  Filled 2022-06-20: qty 3

## 2022-06-20 MED ORDER — LACTATED RINGERS IV SOLN: INTRAVENOUS | Status: DC

## 2022-06-20 MED ORDER — BUPIVACAINE-MELOXICAM ER 400-12 MG/14ML IJ SOLN
INTRAMUSCULAR | Status: AC
  Filled 2022-06-20: qty 1

## 2022-06-20 MED ORDER — PROPOFOL 10 MG/ML IV BOLUS
INTRAVENOUS | Status: DC | PRN
  Administered 2022-06-20: 200 mg via INTRAVENOUS

## 2022-06-20 MED ORDER — PHENOL 1.4 % MT LIQD: 1.0000 | OROMUCOSAL | Status: DC | PRN

## 2022-06-20 MED ORDER — VANCOMYCIN HCL 1000 MG IV SOLR
INTRAVENOUS | Status: DC | PRN
  Administered 2022-06-20: 1000 mg

## 2022-06-20 MED ORDER — POVIDONE-IODINE 10 % EX SWAB
2.0000 | Freq: Once | CUTANEOUS | Status: AC
  Administered 2022-06-20: 2 via TOPICAL

## 2022-06-20 MED ORDER — CEFAZOLIN SODIUM-DEXTROSE 2-4 GM/100ML-% IV SOLN
2.0000 g | Freq: Four times a day (QID) | INTRAVENOUS | Status: AC
  Administered 2022-06-20 – 2022-06-21 (×2): 2 g via INTRAVENOUS
  Filled 2022-06-20 (×2): qty 100

## 2022-06-20 MED ORDER — ASPIRIN 81 MG PO CHEW
81.0000 mg | CHEWABLE_TABLET | Freq: Two times a day (BID) | ORAL | Status: DC
  Administered 2022-06-20 – 2022-06-21 (×2): 81 mg via ORAL
  Filled 2022-06-20 (×2): qty 1

## 2022-06-20 MED ORDER — FENTANYL CITRATE (PF) 100 MCG/2ML IJ SOLN
25.0000 ug | INTRAMUSCULAR | Status: DC | PRN
  Administered 2022-06-20: 50 ug via INTRAVENOUS

## 2022-06-20 MED ORDER — CEFAZOLIN SODIUM-DEXTROSE 2-4 GM/100ML-% IV SOLN
INTRAVENOUS | Status: AC
  Filled 2022-06-20: qty 100

## 2022-06-20 MED ORDER — ONDANSETRON HCL 4 MG/2ML IJ SOLN: 4.0000 mg | Freq: Once | INTRAMUSCULAR | Status: DC | PRN

## 2022-06-20 MED ORDER — 0.9 % SODIUM CHLORIDE (POUR BTL) OPTIME
TOPICAL | Status: DC | PRN
  Administered 2022-06-20: 1000 mL

## 2022-06-20 MED ORDER — TRAZODONE HCL 50 MG PO TABS
100.0000 mg | ORAL_TABLET | Freq: Every day | ORAL | Status: DC
  Administered 2022-06-20: 100 mg via ORAL
  Filled 2022-06-20: qty 2

## 2022-06-20 MED ORDER — TRANEXAMIC ACID-NACL 1000-0.7 MG/100ML-% IV SOLN
1000.0000 mg | INTRAVENOUS | Status: AC
  Administered 2022-06-20: 1000 mg via INTRAVENOUS

## 2022-06-20 MED ORDER — MIDAZOLAM HCL 2 MG/2ML IJ SOLN: 1.0000 mg | Freq: Once | INTRAMUSCULAR | Status: AC

## 2022-06-20 MED ORDER — AMISULPRIDE (ANTIEMETIC) 5 MG/2ML IV SOLN: 10.0000 mg | Freq: Once | INTRAVENOUS | Status: DC | PRN

## 2022-06-20 MED ORDER — SODIUM CHLORIDE 0.9 % IV SOLN: INTRAVENOUS | Status: DC

## 2022-06-20 MED ORDER — ONDANSETRON HCL 4 MG/2ML IJ SOLN
INTRAMUSCULAR | Status: AC
  Filled 2022-06-20: qty 2

## 2022-06-20 MED ORDER — SODIUM CHLORIDE 0.9 % IR SOLN
Status: DC | PRN
  Administered 2022-06-20: 1000 mL

## 2022-06-20 MED ORDER — LIDOCAINE 2% (20 MG/ML) 5 ML SYRINGE
INTRAMUSCULAR | Status: AC
  Filled 2022-06-20: qty 5

## 2022-06-20 MED ORDER — CHLORHEXIDINE GLUCONATE 0.12 % MT SOLN
OROMUCOSAL | Status: AC
  Administered 2022-06-20: 15 mL
  Filled 2022-06-20: qty 15

## 2022-06-20 MED ORDER — RIVAROXABAN 10 MG PO TABS
10.0000 mg | ORAL_TABLET | Freq: Every day | ORAL | Status: DC
  Administered 2022-06-21: 10 mg via ORAL
  Filled 2022-06-20: qty 1

## 2022-06-20 MED ORDER — MIDAZOLAM HCL 2 MG/2ML IJ SOLN
INTRAMUSCULAR | Status: AC
  Administered 2022-06-20: 1 mg via INTRAVENOUS
  Filled 2022-06-20: qty 2

## 2022-06-20 MED ORDER — BUPIVACAINE-MELOXICAM ER 400-12 MG/14ML IJ SOLN
INTRAMUSCULAR | Status: DC | PRN
  Administered 2022-06-20: 400 mg

## 2022-06-20 MED ORDER — RIVAROXABAN 10 MG PO TABS: 10.0000 mg | ORAL_TABLET | Freq: Every day | ORAL | 0 refills | Status: DC

## 2022-06-20 MED ORDER — KETOROLAC TROMETHAMINE 15 MG/ML IJ SOLN
7.5000 mg | Freq: Four times a day (QID) | INTRAMUSCULAR | Status: AC
  Administered 2022-06-20 – 2022-06-21 (×4): 7.5 mg via INTRAVENOUS
  Filled 2022-06-20 (×4): qty 1

## 2022-06-20 MED ORDER — CEFAZOLIN SODIUM-DEXTROSE 2-4 GM/100ML-% IV SOLN
2.0000 g | INTRAVENOUS | Status: AC
  Administered 2022-06-20: 2 g via INTRAVENOUS

## 2022-06-20 MED ORDER — TRANEXAMIC ACID-NACL 1000-0.7 MG/100ML-% IV SOLN
INTRAVENOUS | Status: AC
  Filled 2022-06-20: qty 100

## 2022-06-20 MED ORDER — FENTANYL CITRATE (PF) 100 MCG/2ML IJ SOLN: 50.0000 ug | Freq: Once | INTRAMUSCULAR | Status: AC

## 2022-06-20 MED ORDER — FENTANYL CITRATE (PF) 100 MCG/2ML IJ SOLN
INTRAMUSCULAR | Status: AC
  Administered 2022-06-20: 50 ug via INTRAVENOUS
  Filled 2022-06-20: qty 2

## 2022-06-20 MED ORDER — DOCUSATE SODIUM 100 MG PO CAPS
100.0000 mg | ORAL_CAPSULE | Freq: Two times a day (BID) | ORAL | Status: DC
  Administered 2022-06-20 – 2022-06-21 (×2): 100 mg via ORAL
  Filled 2022-06-20 (×2): qty 1

## 2022-06-20 MED ORDER — TRANEXAMIC ACID 1000 MG/10ML IV SOLN
INTRAVENOUS | Status: DC | PRN
  Administered 2022-06-20: 2000 mg via TOPICAL

## 2022-06-20 MED ORDER — ACETAMINOPHEN 325 MG PO TABS: 325.0000 mg | ORAL_TABLET | Freq: Four times a day (QID) | ORAL | Status: DC | PRN

## 2022-06-20 MED ORDER — FERROUS SULFATE 325 (65 FE) MG PO TABS
325.0000 mg | ORAL_TABLET | Freq: Three times a day (TID) | ORAL | Status: DC
  Administered 2022-06-20 – 2022-06-21 (×3): 325 mg via ORAL
  Filled 2022-06-20 (×3): qty 1

## 2022-06-20 MED ORDER — ONDANSETRON HCL 4 MG/2ML IJ SOLN
INTRAMUSCULAR | Status: DC | PRN
  Administered 2022-06-20: 4 mg via INTRAVENOUS

## 2022-06-20 MED ORDER — METHOCARBAMOL 500 MG PO TABS: 500.0000 mg | ORAL_TABLET | Freq: Four times a day (QID) | ORAL | Status: DC | PRN

## 2022-06-20 MED ORDER — MIDAZOLAM HCL 2 MG/2ML IJ SOLN
INTRAMUSCULAR | Status: AC
  Filled 2022-06-20: qty 2

## 2022-06-20 MED ORDER — MIDAZOLAM HCL 2 MG/2ML IJ SOLN
INTRAMUSCULAR | Status: DC | PRN
  Administered 2022-06-20 (×2): 1 mg via INTRAVENOUS

## 2022-06-20 MED ORDER — DEXAMETHASONE SODIUM PHOSPHATE 10 MG/ML IJ SOLN
INTRAMUSCULAR | Status: DC | PRN
  Administered 2022-06-20: 5 mg via INTRAVENOUS

## 2022-06-20 MED ORDER — EPHEDRINE SULFATE-NACL 50-0.9 MG/10ML-% IV SOSY
PREFILLED_SYRINGE | INTRAVENOUS | Status: DC | PRN
  Administered 2022-06-20: 5 mg via INTRAVENOUS

## 2022-06-20 MED ORDER — PRONTOSAN WOUND IRRIGATION OPTIME
TOPICAL | Status: DC | PRN
  Administered 2022-06-20: 350

## 2022-06-20 MED ORDER — HYDROMORPHONE HCL 1 MG/ML IJ SOLN: 0.5000 mg | INTRAMUSCULAR | Status: DC | PRN

## 2022-06-20 MED ORDER — LIDOCAINE 2% (20 MG/ML) 5 ML SYRINGE
INTRAMUSCULAR | Status: DC | PRN
  Administered 2022-06-20: 40 mg via INTRAVENOUS

## 2022-06-20 MED ORDER — ACETAMINOPHEN 500 MG PO TABS
1000.0000 mg | ORAL_TABLET | Freq: Four times a day (QID) | ORAL | Status: AC
  Administered 2022-06-20 – 2022-06-21 (×4): 1000 mg via ORAL
  Filled 2022-06-20 (×4): qty 2

## 2022-06-20 MED ORDER — ACETAMINOPHEN 500 MG PO TABS
1000.0000 mg | ORAL_TABLET | Freq: Once | ORAL | Status: AC
  Administered 2022-06-20: 1000 mg via ORAL
  Filled 2022-06-20: qty 2

## 2022-06-20 MED ORDER — ORAL CARE MOUTH RINSE: 15.0000 mL | OROMUCOSAL | Status: DC | PRN

## 2022-06-20 MED ORDER — METOCLOPRAMIDE HCL 5 MG PO TABS: 5.0000 mg | ORAL_TABLET | Freq: Three times a day (TID) | ORAL | Status: DC | PRN

## 2022-06-20 MED ORDER — DEXAMETHASONE SODIUM PHOSPHATE 10 MG/ML IJ SOLN
INTRAMUSCULAR | Status: AC
  Filled 2022-06-20: qty 1

## 2022-06-20 MED ORDER — GABAPENTIN 100 MG PO CAPS
100.0000 mg | ORAL_CAPSULE | Freq: Every day | ORAL | Status: DC
  Administered 2022-06-20 – 2022-06-21 (×2): 100 mg via ORAL
  Filled 2022-06-20 (×2): qty 1

## 2022-06-20 MED ORDER — EPHEDRINE 5 MG/ML INJ
INTRAVENOUS | Status: AC
  Filled 2022-06-20: qty 5

## 2022-06-20 MED ORDER — PROPOFOL 10 MG/ML IV BOLUS
INTRAVENOUS | Status: AC
  Filled 2022-06-20: qty 20

## 2022-06-20 MED ORDER — FENTANYL CITRATE (PF) 250 MCG/5ML IJ SOLN
INTRAMUSCULAR | Status: AC
  Filled 2022-06-20: qty 5

## 2022-06-20 MED ORDER — OXYCODONE HCL ER 10 MG PO T12A
10.0000 mg | EXTENDED_RELEASE_TABLET | Freq: Two times a day (BID) | ORAL | Status: DC
  Administered 2022-06-20: 10 mg via ORAL
  Filled 2022-06-20: qty 1

## 2022-06-20 MED ORDER — MENTHOL 3 MG MT LOZG: 1.0000 | LOZENGE | OROMUCOSAL | Status: DC | PRN

## 2022-06-20 MED ORDER — ONDANSETRON HCL 4 MG/2ML IJ SOLN
4.0000 mg | Freq: Four times a day (QID) | INTRAMUSCULAR | Status: DC | PRN
  Administered 2022-06-21: 4 mg via INTRAVENOUS
  Filled 2022-06-20: qty 2

## 2022-06-20 MED ORDER — VANCOMYCIN HCL 1000 MG IV SOLR
INTRAVENOUS | Status: AC
  Filled 2022-06-20: qty 20

## 2022-06-20 MED ORDER — AMLODIPINE BESYLATE 10 MG PO TABS
10.0000 mg | ORAL_TABLET | Freq: Every day | ORAL | Status: DC
  Administered 2022-06-21: 10 mg via ORAL
  Filled 2022-06-20: qty 1

## 2022-06-20 MED ORDER — FENTANYL CITRATE (PF) 250 MCG/5ML IJ SOLN
INTRAMUSCULAR | Status: DC | PRN
  Administered 2022-06-20 (×2): 25 ug via INTRAVENOUS
  Administered 2022-06-20: 50 ug via INTRAVENOUS
  Administered 2022-06-20 (×2): 25 ug via INTRAVENOUS
  Administered 2022-06-20: 50 ug via INTRAVENOUS

## 2022-06-20 MED ORDER — FENTANYL CITRATE (PF) 100 MCG/2ML IJ SOLN
INTRAMUSCULAR | Status: AC
  Filled 2022-06-20: qty 2

## 2022-06-20 MED ORDER — ONDANSETRON HCL 4 MG PO TABS: 4.0000 mg | ORAL_TABLET | Freq: Four times a day (QID) | ORAL | Status: DC | PRN

## 2022-06-20 MED ORDER — PHENYLEPHRINE HCL-NACL 20-0.9 MG/250ML-% IV SOLN
INTRAVENOUS | Status: DC | PRN
  Administered 2022-06-20: 25 ug/min via INTRAVENOUS

## 2022-06-20 SURGICAL SUPPLY — 82 items
ALCOHOL 70% 16 OZ (MISCELLANEOUS) ×1 IMPLANT
BAG COUNTER SPONGE SURGICOUNT (BAG) IMPLANT
BAG DECANTER FOR FLEXI CONT (MISCELLANEOUS) ×1 IMPLANT
BANDAGE ESMARK 6X9 LF (GAUZE/BANDAGES/DRESSINGS) IMPLANT
BLADE SAG 18X100X1.27 (BLADE) ×1 IMPLANT
BLADE SAGITTAL 25.0X1.27X90 (BLADE) IMPLANT
BLADE SAW SGTL 13.0X1.19X90.0M (BLADE) IMPLANT
BNDG ESMARK 6X9 LF (GAUZE/BANDAGES/DRESSINGS)
BOWL SMART MIX CTS (DISPOSABLE) ×1 IMPLANT
CEMENT BONE REFOBACIN R1X40 US (Cement) IMPLANT
COOLER ICEMAN CLASSIC (MISCELLANEOUS) ×1 IMPLANT
COVER SURGICAL LIGHT HANDLE (MISCELLANEOUS) ×1 IMPLANT
CUFF TOURN SGL QUICK 34 (TOURNIQUET CUFF) ×1
CUFF TRNQT CYL 34X4.125X (TOURNIQUET CUFF) ×1 IMPLANT
DERMABOND ADVANCED .7 DNX12 (GAUZE/BANDAGES/DRESSINGS) ×1 IMPLANT
DRAPE EXTREMITY T 121X128X90 (DISPOSABLE) ×1 IMPLANT
DRAPE HALF SHEET 40X57 (DRAPES) ×1 IMPLANT
DRAPE INCISE IOBAN 66X45 STRL (DRAPES) ×1 IMPLANT
DRAPE ORTHO SPLIT 77X108 STRL (DRAPES)
DRAPE POUCH INSTRU U-SHP 10X18 (DRAPES) ×1 IMPLANT
DRAPE SURG ORHT 6 SPLT 77X108 (DRAPES) IMPLANT
DRAPE U-SHAPE 47X51 STRL (DRAPES) ×2 IMPLANT
DRSG AQUACEL AG ADV 3.5X10 (GAUZE/BANDAGES/DRESSINGS) IMPLANT
ELECT CAUTERY BLADE 6.4 (BLADE) ×1 IMPLANT
ELECT PENCIL ROCKER SW 15FT (MISCELLANEOUS) ×1 IMPLANT
ELECT REM PT RETURN 9FT ADLT (ELECTROSURGICAL) ×1
ELECTRODE REM PT RTRN 9FT ADLT (ELECTROSURGICAL) ×1 IMPLANT
GLOVE BIOGEL PI IND STRL 7.0 (GLOVE) ×2 IMPLANT
GLOVE BIOGEL PI IND STRL 7.5 (GLOVE) ×5 IMPLANT
GLOVE ECLIPSE 7.0 STRL STRAW (GLOVE) ×3 IMPLANT
GLOVE INDICATOR 7.0 STRL GRN (GLOVE) ×1 IMPLANT
GLOVE INDICATOR 7.5 STRL GRN (GLOVE) ×1 IMPLANT
GLOVE SURG SYN 7.5  E (GLOVE) ×2
GLOVE SURG SYN 7.5 E (GLOVE) ×2 IMPLANT
GLOVE SURG SYN 7.5 PF PI (GLOVE) ×2 IMPLANT
GLOVE SURG UNDER LTX SZ7.5 (GLOVE) ×2 IMPLANT
GLOVE SURG UNDER POLY LF SZ7 (GLOVE) ×2 IMPLANT
GOWN STRL REIN XL XLG (GOWN DISPOSABLE) ×1 IMPLANT
GOWN STRL REUS W/ TWL LRG LVL3 (GOWN DISPOSABLE) ×1 IMPLANT
GOWN STRL REUS W/TWL LRG LVL3 (GOWN DISPOSABLE) ×1
GOWN TOGA ZIPPER T7+ PEEL AWAY (MISCELLANEOUS) ×2 IMPLANT
HANDPIECE INTERPULSE COAX TIP (DISPOSABLE) ×1
HDLS TROCR DRIL PIN KNEE 75 (PIN) ×1
HOOD PEEL AWAY T7 (MISCELLANEOUS) ×1 IMPLANT
INSERT ARTISURF SZ 6-7 LT (Insert) IMPLANT
KIT BASIN OR (CUSTOM PROCEDURE TRAY) ×1 IMPLANT
KIT TURNOVER KIT B (KITS) ×1 IMPLANT
KNEE SYSTEM FEMUR SZ 6 LT (Knees) IMPLANT
MANIFOLD NEPTUNE II (INSTRUMENTS) ×1 IMPLANT
MARKER SKIN DUAL TIP RULER LAB (MISCELLANEOUS) ×2 IMPLANT
NDL SPNL 18GX3.5 QUINCKE PK (NEEDLE) ×1 IMPLANT
NEEDLE SPNL 18GX3.5 QUINCKE PK (NEEDLE) ×1 IMPLANT
NS IRRIG 1000ML POUR BTL (IV SOLUTION) ×1 IMPLANT
PACK TOTAL JOINT (CUSTOM PROCEDURE TRAY) ×1 IMPLANT
PAD ARMBOARD 7.5X6 YLW CONV (MISCELLANEOUS) ×2 IMPLANT
PAD COLD SHLDR WRAP-ON (PAD) ×1 IMPLANT
PIN DRILL HDLS TROCAR 75 4PK (PIN) IMPLANT
SAW OSC TIP CART 19.5X105X1.3 (SAW) ×1 IMPLANT
SCREW FEMALE HEX FIX 25X2.5 (ORTHOPEDIC DISPOSABLE SUPPLIES) IMPLANT
SET HNDPC FAN SPRY TIP SCT (DISPOSABLE) ×1 IMPLANT
SOLUTION PRONTOSAN WOUND 350ML (IRRIGATION / IRRIGATOR) ×1 IMPLANT
STAPLER VISISTAT 35W (STAPLE) IMPLANT
STEM POLY PAT PLY 29M KNEE (Knees) IMPLANT
STEM TIBIA 5 DEG SZ C L KNEE (Knees) IMPLANT
SUCTION FRAZIER HANDLE 10FR (MISCELLANEOUS) ×1
SUCTION TUBE FRAZIER 10FR DISP (MISCELLANEOUS) ×1 IMPLANT
SUT ETHILON 2 0 FS 18 (SUTURE) IMPLANT
SUT VIC AB 0 CT1 27 (SUTURE) ×2
SUT VIC AB 0 CT1 27XBRD ANBCTR (SUTURE) IMPLANT
SUT VIC AB 1 CTX 27 (SUTURE) IMPLANT
SUT VIC AB 1 CTX 36 (SUTURE) ×3
SUT VIC AB 1 CTX36XBRD ANBCTRL (SUTURE) IMPLANT
SUT VIC AB 2-0 CT1 27 (SUTURE) ×4
SUT VIC AB 2-0 CT1 TAPERPNT 27 (SUTURE) IMPLANT
SYR 50ML LL SCALE MARK (SYRINGE) ×2 IMPLANT
TIBIA STEM 5 DEG SZ C L KNEE (Knees) ×1 IMPLANT
TOWEL GREEN STERILE (TOWEL DISPOSABLE) ×1 IMPLANT
TOWEL GREEN STERILE FF (TOWEL DISPOSABLE) ×1 IMPLANT
TRAY CATH 16FR W/PLASTIC CATH (SET/KITS/TRAYS/PACK) IMPLANT
TUBE SUCT ARGYLE STRL (TUBING) ×1 IMPLANT
UNDERPAD 30X36 HEAVY ABSORB (UNDERPADS AND DIAPERS) ×1 IMPLANT
YANKAUER SUCT BULB TIP NO VENT (SUCTIONS) ×2 IMPLANT

## 2022-06-20 NOTE — Anesthesia Procedure Notes (Signed)
Procedure Name: LMA Insertion Date/Time: 06/20/2022 12:00 PM  Performed by: Kyung Rudd, CRNAPre-anesthesia Checklist: Patient identified, Emergency Drugs available, Suction available and Patient being monitored Patient Re-evaluated:Patient Re-evaluated prior to induction Oxygen Delivery Method: Circle system utilized Preoxygenation: Pre-oxygenation with 100% oxygen Induction Type: IV induction LMA: LMA inserted LMA Size: 4.0 Number of attempts: 1 Placement Confirmation: positive ETCO2 and breath sounds checked- equal and bilateral Tube secured with: Tape Dental Injury: Teeth and Oropharynx as per pre-operative assessment

## 2022-06-20 NOTE — Op Note (Signed)
Total Knee Arthroplasty Procedure Note  Preoperative diagnosis: Left knee osteoarthritis  Postoperative diagnosis:same  Operative findings: High grade chondromalacia of articular surfaces  Operative procedure: Left total knee arthroplasty. CPT [phone]  Surgeon: N. Eduard Roux, MD  Assist: Madalyn Rob, PA-C; necessary for the timely completion of procedure and due to complexity of procedure.  Anesthesia: general, regional, local  Tourniquet time: see anesthesia record  Implants used: Zimmer persona cemented Femur: CR 6 narrow Tibia: C Patella: 29 mm Polyethylene: 10 mm, MC  Indication: Amber Mccall is a 72 y.o. year old female with a history of knee pain. Having failed conservative management, the patient elected to proceed with a total knee arthroplasty.  We have reviewed the risk and benefits of the surgery and they elected to proceed after voicing understanding.  Procedure:  After informed consent was obtained and understanding of the risk were voiced including but not limited to bleeding, infection, damage to surrounding structures including nerves and vessels, blood clots, leg length inequality and the failure to achieve desired results, the operative extremity was marked with verbal confirmation of the patient in the holding area.   The patient was then brought to the operating room and transported to the operating room table in the supine position.  A tourniquet was applied to the operative extremity around the upper thigh. The operative limb was then prepped and draped in the usual sterile fashion and preoperative antibiotics were administered.  A time out was performed prior to the start of surgery confirming the correct extremity, preoperative antibiotic administration, as well as team members, implants and instruments available for the case. Correct surgical site was also confirmed with preoperative radiographs. The limb was then elevated for exsanguination and the  tourniquet was inflated. A midline incision was made and a standard medial parapatellar approach was performed.  The infrapatellar fat pad was removed.  Suprapatellar synovium was removed to reveal the anterior distal femoral cortex.  A medial peel was performed to release the capsule of the medial tibial plateau.  The patella was then everted and was prepared and sized to a 29 mm.  A cover was placed on the patella for protection from retractors.  The knee was then brought into full flexion and we then turned our attention to the femur.  The cruciates were sacrificed.  Start site was drilled in the femur and the intramedullary distal femoral cutting guide was placed, set at 5 degrees valgus, taking 12 mm of distal resection. The distal cut was made. Osteophytes were then removed.  Next, the proximal tibial cutting guide was placed with appropriate slope, varus/valgus alignment and depth of resection. The proximal tibial cut was made taking 4 mm off the low side. Gap blocks were then used to assess the extension gap and alignment, and appropriate soft tissue releases were performed. Attention was turned back to the femur, which was sized using the sizing guide to a size 6 narrow. Appropriate rotation of the femoral component was determined using epicondylar axis, Whiteside's line, and assessing the flexion gap under ligament tension. The appropriate size 4-in-1 cutting block was placed and checked with an angel wing and cuts were made. Posterior femoral osteophytes and uncapped bone were then removed with the curved osteotome.  Trial components were placed, and stability was checked in full extension, mid-flexion, and deep flexion. Proper tibial rotation was determined and marked.  The patella tracked well after a limited lateral release.  The femoral lugs were then drilled. Trial components were  then removed and tibial preparation performed.  The tibia was sized for a size C component.   The bony surfaces were  irrigated with a pulse lavage and then dried. Bone cement was vacuum mixed on the back table, and the final components sized above were cemented into place.  Antibiotic irrigation was placed in the knee joint and soft tissues while the cement cured.  After cement had finished curing, excess cement was removed. The stability of the construct was re-evaluated throughout a range of motion and found to be acceptable. The trial liner was removed, the knee was copiously irrigated, and the knee was re-evaluated for any excess bone debris. The real polyethylene liner, 10 mm thick, was inserted and checked to ensure the locking mechanism had engaged appropriately. The tourniquet was deflated and hemostasis was achieved. The wound was irrigated with normal saline.  One gram of vancomycin powder was placed in the surgical bed.  Topical 0.25% bupivacaine and meloxicam was placed in the joint for postoperative pain.  Capsular closure was performed with a #1 vicryl, subcutaneous fat closed with a 0 vicryl suture, then subcutaneous tissue closed with interrupted 2.0 vicryl suture. The skin was then closed with a 2.0 nylon and dermabond. A sterile dressing was applied.  The patient was awakened in the operating room and taken to recovery in stable condition. All sponge, needle, and instrument counts were correct at the end of the case.  [name] was necessary for opening, closing, retracting, limb positioning and overall facilitation and completion of the surgery.  Position: supine  Complications: none.  Time Out: performed   Drains/Packing: none  Estimated blood loss: minimal  Returned to Recovery Room: in good condition.   Antibiotics: yes   Mechanical VTE (DVT) Prophylaxis: sequential compression devices, TED thigh-high  Chemical VTE (DVT) Prophylaxis: aspirin  Fluid Replacement  Crystalloid: see anesthesia record Blood: none  FFP: none   Specimens Removed: 1 to pathology   Sponge and Instrument  Count Correct? yes   PACU: portable radiograph - knee AP and Lateral   Plan/RTC: Return in 2 weeks for wound check.   Weight Bearing/Load Lower Extremity: full   Implant Name Type Inv. Item Serial No. Manufacturer Lot No. LRB No. Used Action  CEMENT BONE REFOBACIN [id number] [place] - [id number] Cement CEMENT BONE REFOBACIN [id number] [place]  ZIMMER RECON(ORTH,TRAU,BIO,SG) [id number] Left 2 Implanted  KNEE SYSTEM FEMUR SZ 6 LT - [id number] Knees KNEE SYSTEM FEMUR SZ 6 LT  ZIMMER RECON(ORTH,TRAU,BIO,SG) [id number] Left 1 Implanted  TIBIA STEM 5 DEG SZ C L KNEE - [id number] Knees TIBIA STEM 5 DEG SZ C L KNEE  ZIMMER RECON(ORTH,TRAU,BIO,SG) [id number] Left 1 Implanted  INSERT ARTISURF SZ 6-7 LT - [id number] Insert INSERT ARTISURF SZ 6-7 LT  ZIMMER RECON(ORTH,TRAU,BIO,SG) [id number] Left 1 Implanted  STEM POLY PAT PLY 66M KNEE - [id number] Knees STEM POLY PAT PLY 66M KNEE  ZIMMER RECON(ORTH,TRAU,BIO,SG) [id number] Left 1 Implanted    N. Eduard Roux, MD [facility] 1:48 PM

## 2022-06-20 NOTE — H&P (Signed)
PREOPERATIVE H&P  Chief Complaint: left knee osteoarthritis  HPI: Amber Mccall is a 72 y.o. female who presents for surgical treatment of left knee osteoarthritis.  She denies any changes in medical history.  Past Medical History:  Diagnosis Date   Anginal pain ([place])    pt says she feels like this may be related to acid reflux   Closed fracture of metatarsal bone(s) 01/30/2013   Depression    Essential hypertension    GERD (gastroesophageal reflux disease)    Lumbar radiculopathy    Osteoarthritis    Osteoporosis    Past Surgical History:  Procedure Laterality Date   ABDOMINAL HYSTERECTOMY     APPENDECTOMY     CATARACT EXTRACTION Bilateral 2022   [place] Osteotomy w/ Bone Graft Left 04/16/2015   Left foot   IR RADIOLOGIST EVAL & MGMT  04/11/2017   OPEN REDUCTION INTERNAL FIXATION (ORIF) DISTAL PHALANX Left 10/23/2014   5th Metatarsal   TOTAL KNEE ARTHROPLASTY Right 10/18/2021   Procedure: RIGHT TOTAL KNEE ARTHROPLASTY;  Surgeon: Leandrew Koyanagi, MD;  Location: [facility];  Service: Orthopedics;  Laterality: Right;   Social History   Socioeconomic History   Marital status: Widowed    Spouse name: Not on file   Number of children: 3   Years of education: Not on file   Highest education level: Not on file  Occupational History   Occupation: retired  Tobacco Use   Smoking status: Never   Smokeless tobacco: Never  Vaping Use   Vaping Use: Never used  Substance and Sexual Activity   Alcohol use: Not Currently   Drug use: Not Currently   Sexual activity: Not on file  Other Topics Concern   Not on file  Social History Narrative   Not on file   Social Determinants of Health   Financial Resource Strain: Not on file  Food Insecurity: Not on file  Transportation Needs: Not on file  Physical Activity: Not on file  Stress: Not on file  Social Connections: Not on file   Family History  Problem Relation Age of Onset   Colon cancer Neg Hx    Rectal cancer Neg Hx     Stomach cancer Neg Hx    No Known Allergies Prior to Admission medications   Medication Sig Start Date End Date Taking? Authorizing Provider  Acetaminophen (TYLENOL PO) Take 1 tablet by mouth at bedtime as needed (pain).   Yes [provider]  amLODipine (NORVASC) 10 MG tablet Take 10 mg by mouth daily. 05/02/16  Yes [provider]  aspirin EC 81 MG tablet Take 1 tablet (81 mg total) by mouth 2 (two) times daily. To be taken after surgery to prevent blood clots Patient not taking: Reported on 06/14/2022 06/13/22 06/13/23  Aundra Dubin, PA-C  calcium citrate (CALCITRATE - DOSED IN MG ELEMENTAL CALCIUM) 950 (200 Ca) MG tablet Take 950 mg by mouth daily.   Yes [provider]  diclofenac Sodium (VOLTAREN) 1 % GEL Apply 1 Application topically as needed (pain).   Yes [provider]  docusate sodium (COLACE) 100 MG capsule Take 1 capsule (100 mg total) by mouth daily as needed. Patient not taking: Reported on 06/14/2022 06/13/22 06/13/23  Aundra Dubin, PA-C  methocarbamol (ROBAXIN-750) 750 MG tablet Take 1 tablet (750 mg total) by mouth 2 (two) times daily as needed for muscle spasms. Patient not taking: Reported on 06/14/2022 06/13/22   Aundra Dubin, PA-C  ondansetron (ZOFRAN) 4 MG  tablet Take 1 tablet (4 mg total) by mouth every 8 (eight) hours as needed for nausea or vomiting. Patient not taking: Reported on 06/14/2022 06/13/22   Aundra Dubin, PA-C  oxyCODONE-acetaminophen (PERCOCET) 5-325 MG tablet Take 1-2 tablets by mouth every 6 (six) hours as needed. To be taken after surgery Patient not taking: Reported on 06/14/2022 06/13/22   Aundra Dubin, PA-C  pantoprazole (PROTONIX) 40 MG tablet Take 1 tablet (40 mg total) by mouth daily. 10/07/21  Yes Jackquline Denmark, MD  traZODone (DESYREL) 100 MG tablet Take 1 tablet (100 mg total) by mouth at bedtime. 03/07/22  Yes Shelda Pal, DO  VITAMIN D, CHOLECALCIFEROL, PO Take 1 capsule by mouth daily.    Yes [provider]  gabapentin (NEURONTIN) 100 MG capsule Take 100 mg by mouth daily.    [provider]  predniSONE (STERAPRED UNI-PAK 21 TAB) 10 MG (21) TBPK tablet Take as directed Patient not taking: Reported on 06/09/2022 03/15/22   Leandrew Koyanagi, MD  sertraline (ZOLOFT) 50 MG tablet Take 1 tablet (50 mg total) by mouth daily. Patient not taking: Reported on 06/14/2022 03/07/22   Shelda Pal, DO  traMADol (ULTRAM) 50 MG tablet Take 1 tablet (50 mg total) by mouth every 6 (six) hours as needed. Patient not taking: Reported on 06/14/2022 05/10/22   Charlett Blake, MD     Positive ROS: All other systems have been reviewed and were otherwise negative with the exception of those mentioned in the HPI and as above.  Physical Exam: General: Alert, no acute distress Cardiovascular: No pedal edema Respiratory: No cyanosis, no use of accessory musculature GI: abdomen soft Skin: No lesions in the area of chief complaint Neurologic: Sensation intact distally Psychiatric: Patient is competent for consent with normal mood and affect Lymphatic: no lymphedema  MUSCULOSKELETAL: exam stable  Assessment: left knee osteoarthritis  Plan: Plan for Procedure(s): LEFT TOTAL KNEE ARTHROPLASTY  The risks benefits and alternatives were discussed with the patient including but not limited to the risks of nonoperative treatment, versus surgical intervention including infection, bleeding, nerve injury,  blood clots, cardiopulmonary complications, morbidity, mortality, among others, and they were willing to proceed.   Eduard Roux, MD 06/20/2022 10:18 AM

## 2022-06-20 NOTE — Discharge Instructions (Signed)

## 2022-06-20 NOTE — Transfer of Care (Signed)
Immediate Anesthesia Transfer of Care Note  Patient: Amber Mccall  Procedure(s) Performed: LEFT TOTAL KNEE REPLACEMENT (Left: Knee)  Patient Location: PACU  Anesthesia Type:GA combined with regional for post-op pain  Level of Consciousness: awake, alert , and oriented  Airway & Oxygen Therapy: Patient Spontanous Breathing and Patient connected to nasal cannula oxygen  Post-op Assessment: Report given to RN, Post -op Vital signs reviewed and stable, and Patient moving all extremities  Post vital signs: Reviewed and stable  Last Vitals:  Vitals Value Taken Time  BP 141/71 06/20/22 1425  Temp 36.8 C 06/20/22 1425  Pulse 81 06/20/22 1427  Resp 11 06/20/22 1427  SpO2 92 % 06/20/22 1427  Vitals shown include unvalidated device data.  Last Pain:  Vitals:   06/20/22 1118  TempSrc:   PainSc: 0-No pain      Patients Stated Pain Goal: 0 ([phone])  Complications: No notable events documented.

## 2022-06-20 NOTE — Anesthesia Postprocedure Evaluation (Signed)
Anesthesia Post Note  Patient: Amber Mccall  Procedure(s) Performed: LEFT TOTAL KNEE REPLACEMENT (Left: Knee)     Patient location during evaluation: PACU Anesthesia Type: Regional and General Level of consciousness: awake Pain management: pain level controlled Vital Signs Assessment: post-procedure vital signs reviewed and stable Respiratory status: spontaneous breathing, nonlabored ventilation and respiratory function stable Cardiovascular status: blood pressure returned to baseline and stable Postop Assessment: no apparent nausea or vomiting Anesthetic complications: no   No notable events documented.  Last Vitals:  Vitals:   06/20/22 1549 06/20/22 1948  BP: 132/70 121/64  Pulse: 94 (!) 101  Resp: 16 18  Temp: (!) 36.4 C 36.7 C  SpO2: 93% 93%    Last Pain:  Vitals:   06/20/22 1948  TempSrc: Oral  PainSc:                  Omega Slager P Bryton Waight

## 2022-06-20 NOTE — Anesthesia Preprocedure Evaluation (Addendum)
Anesthesia Evaluation  Patient identified by MRN, date of birth, ID band Patient awake    Reviewed: Allergy & Precautions, NPO status , Patient's Chart, lab work & pertinent test results  Airway Mallampati: III  TM Distance: >3 FB Neck ROM: Full    Dental no notable dental hx.    Pulmonary neg pulmonary ROS   Pulmonary exam normal        Cardiovascular hypertension, Pt. on medications Normal cardiovascular exam     Neuro/Psych  PSYCHIATRIC DISORDERS  Depression     Neuromuscular disease    GI/Hepatic Neg liver ROS,GERD  Medicated and Controlled,,  Endo/Other  negative endocrine ROS    Renal/GU negative Renal ROS     Musculoskeletal  (+) Arthritis ,    Abdominal   Peds  Hematology negative hematology ROS (+) Plt: 221   Anesthesia Other Findings left knee osteoarthritis  Reproductive/Obstetrics                             Anesthesia Physical Anesthesia Plan  ASA: 2  Anesthesia Plan: Regional and General   Post-op Pain Management: Regional block*   Induction: Intravenous  PONV Risk Score and Plan: 3 and Ondansetron, Dexamethasone, Midazolam and Treatment may vary due to age or medical condition  Airway Management Planned: LMA  Additional Equipment:   Intra-op Plan:   Post-operative Plan: Extubation in OR  Informed Consent: I have reviewed the patients History and Physical, chart, labs and discussed the procedure including the risks, benefits and alternatives for the proposed anesthesia with the patient or authorized representative who has indicated his/her understanding and acceptance.     Dental advisory given and Interpreter used for interveiw  Plan Discussed with: CRNA  Anesthesia Plan Comments: (Son provided interpretation )       Anesthesia Quick Evaluation

## 2022-06-20 NOTE — Anesthesia Procedure Notes (Signed)
Anesthesia Regional Block: Adductor canal block   Pre-Anesthetic Checklist: , timeout performed,  Correct Patient, Correct Site, Correct Laterality,  Correct Procedure,, site marked,  Risks and benefits discussed,  Surgical consent,  Pre-op evaluation,  At surgeon's request and post-op pain management  Laterality: Left  Prep: chloraprep       Needles:  Injection technique: Single-shot  Needle Type: Echogenic Stimulator Needle     Needle Length: 9cm  Needle Gauge: 21     Additional Needles:   Procedures:,,,, ultrasound used (permanent image in chart),,    Narrative:  Start time: 06/20/2022 11:00 AM End time: 06/20/2022 11:10 AM Injection made incrementally with aspirations every 5 mL.  Performed by: Personally  Anesthesiologist: Murvin Natal, MD  Additional Notes: Functioning IV was confirmed and monitors were applied. A time-out was performed. Hand hygiene and sterile gloves were used. The thigh was placed in a frog-leg position and prepped in a sterile fashion. A 36m 21ga Arrow echogenic stimulator needle was placed using ultrasound guidance.  Negative aspiration and negative test dose prior to incremental administration of local anesthetic. The patient tolerated the procedure well.

## 2022-06-20 NOTE — Evaluation (Signed)
Physical Therapy Evaluation Patient Details Name: Amber Mccall MRN: [id number] DOB: [birth year] Today's Date: 06/20/2022  History of Present Illness  72 y.o. female presents to [facility] hospital on 06/20/2022 for elective L TKA. PMH includes depression, HTN, GERD, OA, osteoporosis.  Clinical Impression  Pt presents to PT with deficits in ROM, gait, balance, strength, power. Pt is mobilizing well, ambulating for short distances with RW. PT provides education on HEP for L knee ROM and strengthening, son is able to translate english handout and interpreter assisted in relaying PT instruction during session. Pt will benefit from frequent mobilization in an effort to improve strength and to restore independence in mobility. PT will follow up tomorrow for further gait/stair training.     Recommendations for follow up therapy are one component of a multi-disciplinary discharge planning process, led by the attending physician.  Recommendations may be updated based on patient status, additional functional criteria and insurance authorization.  Follow Up Recommendations Follow physician's recommendations for discharge plan and follow up therapies      Assistance Recommended at Discharge PRN  Patient can return home with the following  A little help with walking and/or transfers;A little help with bathing/dressing/bathroom;Assistance with cooking/housework;Assist for transportation;Help with stairs or ramp for entrance    Equipment Recommendations None recommended by PT  Recommendations for Other Services       Functional Status Assessment Patient has had a recent decline in their functional status and demonstrates the ability to make significant improvements in function in a reasonable and predictable amount of time.     Precautions / Restrictions Precautions Precautions: Fall;Knee Precaution Booklet Issued: No Precaution Comments: not provided, no copies available in [place] Restrictions Weight Bearing  Restrictions: Yes LLE Weight Bearing: Weight bearing as tolerated      Mobility  Bed Mobility Overal bed mobility: Needs Assistance Bed Mobility: Supine to Sit, Sit to Supine     Supine to sit: Supervision Sit to supine: Supervision        Transfers Overall transfer level: Needs assistance Equipment used: Rolling walker (2 wheels) Transfers: Sit to/from Stand Sit to Stand: Min guard                Ambulation/Gait Ambulation/Gait assistance: Min guard Gait Distance (Feet): 30 Feet Assistive device: Rolling walker (2 wheels) Gait Pattern/deviations: Step-to pattern Gait velocity: reduced Gait velocity interpretation: <1.31 ft/sec, indicative of household ambulator   General Gait Details: slowed step-to gait, intermittent LLE external rotation during swing phase  Stairs            Wheelchair Mobility    Modified Rankin (Stroke Patients Only)       Balance Overall balance assessment: Needs assistance Sitting-balance support: No upper extremity supported, Feet supported Sitting balance-Leahy Scale: Good     Standing balance support: Single extremity supported, Reliant on assistive device for balance Standing balance-Leahy Scale: Poor                               Pertinent Vitals/Pain Pain Assessment Pain Assessment: Faces Faces Pain Scale: Hurts even more Pain Location: L knee Pain Descriptors / Indicators: Aching Pain Intervention(s): Premedicated before session    Home Living Family/patient expects to be discharged to:: Private residence Living Arrangements: Children Available Help at Discharge: Family;Available 24 hours/day Type of Home: House Home Access: Stairs to enter Entrance Stairs-Rails: None Entrance Stairs-Number of Steps: 2   Home Layout: One level Home Equipment: Conservation officer, nature (2  wheels);Cane - single point;Shower seat      Prior Function Prior Level of Function : Independent/Modified Independent              Mobility Comments: ambulatory without DME       Hand Dominance   Dominant Hand: Right    Extremity/Trunk Assessment   Upper Extremity Assessment Upper Extremity Assessment: Overall WFL for tasks assessed    Lower Extremity Assessment Lower Extremity Assessment: LLE deficits/detail LLE Deficits / Details: ROM restrictions as expected post-op TKA, pt is able to perform SLR and ankle pumps,    Cervical / Trunk Assessment Cervical / Trunk Assessment: Normal  Communication   Communication: Interpreter utilized;Prefers language other than English  Cognition Arousal/Alertness: Awake/alert Behavior During Therapy: WFL for tasks assessed/performed Overall Cognitive Status: Within Functional Limits for tasks assessed                                          General Comments General comments (skin integrity, edema, etc.): VSS on RA    Exercises     Assessment/Plan    PT Assessment Patient needs continued PT services  PT Problem List Decreased strength;Decreased range of motion;Decreased activity tolerance;Decreased balance;Decreased mobility;Decreased knowledge of use of DME;Pain       PT Treatment Interventions DME instruction;Gait training;Stair training;Functional mobility training;Therapeutic activities;Therapeutic exercise;Balance training;Neuromuscular re-education;Patient/family education    PT Goals (Current goals can be found in the Care Plan section)  Acute Rehab PT Goals Patient Stated Goal: to return to independence PT Goal Formulation: With patient Time For Goal Achievement: 06/25/22 Potential to Achieve Goals: Good    Frequency 7X/week     Co-evaluation               AM-PAC PT "6 Clicks" Mobility  Outcome Measure Help needed turning from your back to your side while in a flat bed without using bedrails?: A Little Help needed moving from lying on your back to sitting on the side of a flat bed without using bedrails?: A  Little Help needed moving to and from a bed to a chair (including a wheelchair)?: A Little Help needed standing up from a chair using your arms (e.g., wheelchair or bedside chair)?: A Little Help needed to walk in hospital room?: A Little Help needed climbing 3-5 steps with a railing? : A Lot 6 Click Score: 17    End of Session   Activity Tolerance: Patient tolerated treatment well Patient left: in bed;with call bell/phone within reach;with bed alarm set Nurse Communication: Mobility status PT Visit Diagnosis: Other abnormalities of gait and mobility (R26.89);Pain Pain - Right/Left: Left Pain - part of body: Knee    Time: 1710-1730 PT Time Calculation (min) (ACUTE ONLY): 20 min   Charges:   PT Evaluation $PT Eval Low Complexity: 1 Low          Zenaida Niece, PT, DPT Acute Rehabilitation Office [phone]   Zenaida Niece 06/20/2022, 5:45 PM

## 2022-06-21 ENCOUNTER — Encounter (HOSPITAL_COMMUNITY): Payer: Self-pay | Admitting: Orthopaedic Surgery

## 2022-06-21 DIAGNOSIS — Z96651 Presence of right artificial knee joint: Secondary | ICD-10-CM | POA: Diagnosis not present

## 2022-06-21 DIAGNOSIS — Z79899 Other long term (current) drug therapy: Secondary | ICD-10-CM | POA: Diagnosis not present

## 2022-06-21 DIAGNOSIS — I10 Essential (primary) hypertension: Secondary | ICD-10-CM | POA: Diagnosis not present

## 2022-06-21 DIAGNOSIS — Z96652 Presence of left artificial knee joint: Secondary | ICD-10-CM | POA: Diagnosis not present

## 2022-06-21 DIAGNOSIS — M1712 Unilateral primary osteoarthritis, left knee: Secondary | ICD-10-CM | POA: Diagnosis not present

## 2022-06-21 DIAGNOSIS — Z7982 Long term (current) use of aspirin: Secondary | ICD-10-CM | POA: Diagnosis not present

## 2022-06-21 LAB — CBC
HCT: 33.3 % — ABNORMAL LOW (ref 36.0–46.0)
Hemoglobin: 10.7 g/dL — ABNORMAL LOW (ref 12.0–15.0)
MCH: 29.1 pg (ref 26.0–34.0)
MCHC: 32.1 g/dL (ref 30.0–36.0)
MCV: 90.5 fL (ref 80.0–100.0)
Platelets: 193 10*3/uL (ref 150–400)
RBC: 3.68 MIL/uL — ABNORMAL LOW (ref 3.87–5.11)
RDW: 12.4 % (ref 11.5–15.5)
WBC: 6.2 10*3/uL (ref 4.0–10.5)
nRBC: 0 % (ref 0.0–0.2)

## 2022-06-21 NOTE — Progress Notes (Signed)
Physical Therapy Treatment Patient Details Name: Amber Mccall MRN: [id number] DOB: [birth year] Today's Date: 06/21/2022   History of Present Illness 72 y.o. female presents to [facility] hospital on 06/20/2022 for elective L TKA. PMH includes depression, HTN, GERD, OA, osteoporosis.    PT Comments    Pt was received sitting in recliner and agreeable to session, however reporting nausea and pt vomiting intermittently during session. Pt able to complete all mobility with up to supervision for safety. Pt reported 9/10 pain, however able to complete all mobility without limitation. Pt able to perform stair trial safely with cues for technique and sequence. Pt reports her son will be available for assistance at home. Anticipate pt and family will be able to manage pt's mobility needs at home.   Recommendations for follow up therapy are one component of a multi-disciplinary discharge planning process, led by the attending physician.  Recommendations may be updated based on patient status, additional functional criteria and insurance authorization.  Follow Up Recommendations  Follow physician's recommendations for discharge plan and follow up therapies     Assistance Recommended at Discharge PRN  Patient can return home with the following A little help with walking and/or transfers;A little help with bathing/dressing/bathroom;Assistance with cooking/housework;Assist for transportation;Help with stairs or ramp for entrance   Equipment Recommendations  None recommended by PT    Recommendations for Other Services       Precautions / Restrictions Precautions Precautions: Fall;Knee Precaution Booklet Issued: No Precaution Comments: not provided, no copies available in [place] Restrictions Weight Bearing Restrictions: Yes LLE Weight Bearing: Weight bearing as tolerated     Mobility  Bed Mobility               General bed mobility comments: Pt beginning and ending session in recliner     Transfers Overall transfer level: Needs assistance Equipment used: Rolling walker (2 wheels) Transfers: Sit to/from Stand Sit to Stand: Supervision                Ambulation/Gait Ambulation/Gait assistance: Supervision Gait Distance (Feet): 180 Feet Assistive device: Rolling walker (2 wheels) Gait Pattern/deviations: Step-through pattern       General Gait Details: Pt demonstrating step-through pattern with good pace. Cues for RW proximity and upright posture.   Stairs Stairs: Yes Stairs assistance: Min guard Stair Management: With walker Number of Stairs: 3 General stair comments: Pt able to safely ascend/descend 3 stairs with cues for technique and sequence.       Balance Overall balance assessment: Needs assistance Sitting-balance support: No upper extremity supported, Feet supported Sitting balance-Leahy Scale: Good Sitting balance - Comments: in recliner   Standing balance support: During functional activity, Single extremity supported Standing balance-Leahy Scale: Fair Standing balance comment: with RW support                            Cognition Arousal/Alertness: Awake/alert Behavior During Therapy: WFL for tasks assessed/performed Overall Cognitive Status: Within Functional Limits for tasks assessed                                          Exercises Total Joint Exercises Ankle Circles/Pumps: AROM, Seated, Both, 5 reps Quad Sets: AROM, Left, 5 reps, Seated    General Comments General comments (skin integrity, edema, etc.): Encouraged pt to rest knee in extention. Interpreter [phone]  Pertinent Vitals/Pain Pain Assessment Pain Assessment: 0-10 Pain Score: 9  Pain Location: L knee Pain Descriptors / Indicators: Aching Pain Intervention(s): Limited activity within patient's tolerance, Monitored during session, RN gave pain meds during session, Repositioned, Ice applied     PT Goals (current goals can now be  found in the care plan section) Acute Rehab PT Goals Patient Stated Goal: to return to independence PT Goal Formulation: With patient Time For Goal Achievement: 06/25/22 Potential to Achieve Goals: Good Progress towards PT goals: Progressing toward goals    Frequency    7X/week      PT Plan Current plan remains appropriate       AM-PAC PT "6 Clicks" Mobility   Outcome Measure  Help needed turning from your back to your side while in a flat bed without using bedrails?: A Little Help needed moving from lying on your back to sitting on the side of a flat bed without using bedrails?: A Little Help needed moving to and from a bed to a chair (including a wheelchair)?: A Little Help needed standing up from a chair using your arms (e.g., wheelchair or bedside chair)?: A Little Help needed to walk in hospital room?: A Little Help needed climbing 3-5 steps with a railing? : A Little 6 Click Score: 18    End of Session Equipment Utilized During Treatment: Gait belt Activity Tolerance: Treatment limited secondary to medical complications (Comment) (Limited by nausea and vomiting throughout session) Patient left: in chair;with call bell/phone within reach Nurse Communication: Mobility status PT Visit Diagnosis: Other abnormalities of gait and mobility (R26.89);Pain     Time: 0925-1005 PT Time Calculation (min) (ACUTE ONLY): 40 min  Charges:  $Gait Training: 23-37 mins $Therapeutic Exercise: 8-22 mins                     Michelle Nasuti, PTA Acute Rehabilitation Services Secure Chat Preferred  [phone]    Michelle Nasuti 06/21/2022, 10:18 AM

## 2022-06-21 NOTE — Progress Notes (Signed)
Discharge:  Pt d/c from room via wheelchair, Family member with the pt.  Discharge instructions given to the patient and family members.  No questions from pt,   Pt dressed in street clothes and left with discharge papers and prescriptions  in hand.  IV d/ced, emoved and no complaints of pain or discomfort.

## 2022-06-21 NOTE — Evaluation (Signed)
Occupational Therapy Evaluation Patient Details Name: Amber Mccall MRN: [id number] DOB: August 02, 1950 Today's Date: 06/21/2022   History of Present Illness 72 y.o. female presents to [facility] hospital on 06/20/2022 for elective L TKA. PMH includes depression, HTN, GERD, OA, osteoporosis.   Clinical Impression   Amber Mccall was evaluated s/p the above admission list. She is at baseline, her son lives with her and can provide assist as needed at d/c. Upon evaluation she was limited by expected surgical pain and decreased activity tolerance. Overall she required generalized superivsion A for all mobility and ADLs, no physical assist, safety concerns or overt LOBs noted. Pt does not need further OT services, will sign off. Recommend d/c to home with support of family initially and to follow the surgeons plan for follow up therapies.       Recommendations for follow up therapy are one component of a multi-disciplinary discharge planning process, led by the attending physician.  Recommendations may be updated based on patient status, additional functional criteria and insurance authorization.   Follow Up Recommendations  Follow physician's recommendations for discharge plan and follow up therapies     Assistance Recommended at Discharge PRN  Patient can return home with the following A little help with walking and/or transfers;A little help with bathing/dressing/bathroom;Assistance with cooking/housework;Assist for transportation;Help with stairs or ramp for entrance    Functional Status Assessment  Patient has had a recent decline in their functional status and demonstrates the ability to make significant improvements in function in a reasonable and predictable amount of time.  Equipment Recommendations  None recommended by OT       Precautions / Restrictions Precautions Precautions: Fall;Knee Precaution Booklet Issued: No Precaution Comments: not provided, no copies available in [place] Restrictions Weight  Bearing Restrictions: Yes LLE Weight Bearing: Weight bearing as tolerated      Mobility Bed Mobility Overal bed mobility: Modified Independent             General bed mobility comments: HOB slightly elevated, no physical assist required    Transfers Overall transfer level: Needs assistance Equipment used: Rolling walker (2 wheels) Transfers: Sit to/from Stand Sit to Stand: Supervision                  Balance Overall balance assessment: Needs assistance Sitting-balance support: No upper extremity supported, Feet supported Sitting balance-Leahy Scale: Good     Standing balance support: Single extremity supported, During functional activity Standing balance-Leahy Scale: Fair                             ADL either performed or assessed with clinical judgement   ADL Overall ADL's : Needs assistance/impaired                                     Functional mobility during ADLs: Supervision/safety;Rolling walker (2 wheels) General ADL Comments: generalized supervision A provided throughout, no physical assist required.     Vision Baseline Vision/History: 1 Wears glasses Vision Assessment?: No apparent visual deficits     Perception Perception Perception Tested?: No   Praxis Praxis Praxis tested?: Not tested    Pertinent Vitals/Pain Pain Assessment Pain Assessment: Faces Faces Pain Scale: Hurts even more Pain Location: L knee Pain Descriptors / Indicators: Aching Pain Intervention(s): Limited activity within patient's tolerance, Monitored during session     Hand Dominance Right   Extremity/Trunk Assessment  Upper Extremity Assessment Upper Extremity Assessment: Overall WFL for tasks assessed   Lower Extremity Assessment Lower Extremity Assessment: Defer to PT evaluation   Cervical / Trunk Assessment Cervical / Trunk Assessment: Normal   Communication Communication Communication: Prefers language other than  [place];Interpreter utilized   Cognition Arousal/Alertness: Awake/alert Behavior During Therapy: WFL for tasks assessed/performed Overall Cognitive Status: Within Functional Limits for tasks assessed                                 General Comments: frustrated with exerience at the hospital, flat, eager to d/c home     General Comments  VSS on RA - educated pt on importance of resting with knee completely straight. [place] interpreter utilized throughout     [place] expects to be discharged to:: Private residence Living Arrangements: Children Available Help at Discharge: Family;Available 24 hours/day Type of Home: House Home Access: Stairs to enter [employer] of Steps: 2 Entrance Stairs-Rails: None Home Layout: One level     Bathroom Shower/Tub: Tub/shower unit;Walk-in shower   Bathroom Toilet: Standard     Home Equipment: Conservation officer, nature (2 wheels);Cane - single point;Shower seat   Additional Comments: son and sister will give (A)      Prior Functioning/Environment Prior Level of Function : Independent/Modified Independent             Mobility Comments: ambulatory without DME ADLs Comments: recent R TKA, back to indep ADLs since        OT Problem List: Decreased strength;Decreased range of motion;Decreased activity tolerance;Impaired balance (sitting and/or standing);Pain      OT Treatment/Interventions:      OT Goals(Current goals can be found in the care plan section) Acute Rehab OT Goals Patient Stated Goal: home OT Goal Formulation: With patient Time For Goal Achievement: 06/21/22 Potential to Achieve Goals: Good         AM-PAC OT "6 Clicks" Daily Activity     Outcome Measure Help from another person eating meals?: None Help from another person taking care of personal grooming?: A Little Help from another person toileting, which includes using toliet, bedpan, or urinal?: A Little Help from another person  bathing (including washing, rinsing, drying)?: A Little Help from another person to put on and taking off regular upper body clothing?: None Help from another person to put on and taking off regular lower body clothing?: A Little 6 Click Score: 20   End of Session Equipment Utilized During Treatment: Rolling walker (2 wheels) Nurse Communication: Mobility status  Activity Tolerance: Patient tolerated treatment well Patient left: in chair;with call bell/phone within reach  OT Visit Diagnosis: Unsteadiness on feet (R26.81);Other abnormalities of gait and mobility (R26.89);Muscle weakness (generalized) (M62.81)                Time: [id number] OT Time Calculation (min): 19 min Charges:  OT General Charges $OT Visit: 1 Visit OT Evaluation $OT Eval Moderate Complexity: [place], OTR/L [place] Office [place] Communication Preferred   Elliot Cousin 06/21/2022, 8:58 AM

## 2022-06-21 NOTE — Discharge Summary (Signed)
Patient ID: Amber Mccall MRN: [id number] DOB/AGE: 1951-04-10 72 y.o.  Admit date: 06/20/2022 Discharge date: 06/21/2022  Admission Diagnoses:  Principal Problem:   Primary osteoarthritis of left knee Active Problems:   Status post total left knee replacement   Discharge Diagnoses:  Same  Past Medical History:  Diagnosis Date   Anginal pain ([place])    pt says she feels like this may be related to acid reflux   Closed fracture of metatarsal bone(s) 01/30/2013   Depression    Essential hypertension    GERD (gastroesophageal reflux disease)    Lumbar radiculopathy    Osteoarthritis    Osteoporosis     Surgeries: Procedure(s): LEFT TOTAL KNEE REPLACEMENT on 06/20/2022   Consultants:   Discharged Condition: Improved  Hospital Course: Amber Mccall is an 72 y.o. female who was admitted 06/20/2022 for operative treatment ofPrimary osteoarthritis of left knee. Patient has severe unremitting pain that affects sleep, daily activities, and work/hobbies. After pre-op clearance the patient was taken to the operating room on 06/20/2022 and underwent  Procedure(s): LEFT TOTAL KNEE REPLACEMENT.    Patient was given perioperative antibiotics:  Anti-infectives (From admission, onward)    Start     Dose/Rate Route Frequency Ordered Stop   06/20/22 1800  ceFAZolin (ANCEF) IVPB 2g/100 mL premix        2 g 200 mL/hr over 30 Minutes Intravenous Every 6 hours 06/20/22 1546 06/21/22 0110   06/20/22 1243  vancomycin (VANCOCIN) powder  Status:  Discontinued          As needed 06/20/22 1244 06/20/22 1419   06/20/22 1100  ceFAZolin (ANCEF) IVPB 2g/100 mL premix        2 g 200 mL/hr over 30 Minutes Intravenous On call to O.R. 06/20/22 1017 06/20/22 1220   06/20/22 1020  ceFAZolin (ANCEF) 2-4 GM/100ML-% IVPB       Note to Pharmacy: Wendall Mola M: cabinet override      06/20/22 1020 06/20/22 1214        Patient was given sequential compression devices, early ambulation, and chemoprophylaxis to  prevent DVT.  Patient benefited maximally from hospital stay and there were no complications.    Recent vital signs: Patient Vitals for the past 24 hrs:  BP Temp Temp src Pulse Resp SpO2 Height Weight  06/21/22 0752 134/66 (!) 97.3 F (36.3 C) Oral 91 17 95 % -- --  06/21/22 0500 -- 97.7 F (36.5 C) Oral 100 20 95 % -- --  06/21/22 0007 116/74 98.4 F (36.9 C) Oral 98 16 96 % -- --  06/20/22 1948 121/64 98.1 F (36.7 C) Oral (!) 101 18 93 % -- --  06/20/22 1549 132/70 (!) 97.5 F (36.4 C) Oral 94 16 93 % -- --  06/20/22 1547 -- -- -- -- -- 95 % -- --  06/20/22 1530 138/79 98.2 F (36.8 C) -- 94 14 95 % -- --  06/20/22 1515 136/82 -- -- 98 11 95 % -- --  06/20/22 1500 135/72 -- -- 88 12 96 % -- --  06/20/22 1445 129/72 -- -- 84 13 92 % -- --  06/20/22 1430 130/68 -- -- 83 10 93 % -- --  06/20/22 1425 (!) 141/71 98.2 F (36.8 C) -- 86 13 95 % -- --  06/20/22 1418 -- -- -- -- -- 95 % -- --  06/20/22 1140 (!) 123/57 -- -- 88 14 96 % -- --  06/20/22 1130 124/60 -- -- 92 17 93 % -- --  06/20/22 1125 133/67 -- -- 95 14 97 % -- --  06/20/22 1120 (!) 128/58 -- -- 90 14 96 % -- --  06/20/22 1115 130/65 -- -- 85 17 96 % -- --  06/20/22 1110 125/66 -- -- 77 12 96 % -- --  06/20/22 1105 136/71 -- -- 84 14 96 % -- --  06/20/22 1030 (!) 143/67 98.1 F (36.7 C) Oral 88 18 93 % 5' (1.524 m) 63.5 kg     Recent laboratory studies:  Recent Labs    06/21/22 0149  WBC 6.2  HGB 10.7*  HCT 33.3*  PLT 193     Discharge Medications:   Allergies as of 06/21/2022   No Known Allergies      Medication List     STOP taking these medications    aspirin EC 81 MG tablet   predniSONE 10 MG (21) Tbpk tablet Commonly known as: STERAPRED UNI-PAK 21 TAB   traMADol 50 MG tablet Commonly known as: ULTRAM   TYLENOL PO       TAKE these medications    amLODipine 10 MG tablet Commonly known as: NORVASC Take 10 mg by mouth daily.   calcium citrate 950 (200 Ca) MG tablet Commonly known  as: CALCITRATE - dosed in mg elemental calcium Take 950 mg by mouth daily.   diclofenac Sodium 1 % Gel Commonly known as: VOLTAREN Apply 1 Application topically as needed (pain).   docusate sodium 100 MG capsule Commonly known as: Colace Take 1 capsule (100 mg total) by mouth daily as needed.   gabapentin 100 MG capsule Commonly known as: NEURONTIN Take 100 mg by mouth daily.   methocarbamol 750 MG tablet Commonly known as: Robaxin-750 Take 1 tablet (750 mg total) by mouth 2 (two) times daily as needed for muscle spasms.   ondansetron 4 MG tablet Commonly known as: Zofran Take 1 tablet (4 mg total) by mouth every 8 (eight) hours as needed for nausea or vomiting.   oxyCODONE-acetaminophen 5-325 MG tablet Commonly known as: Percocet Take 1-2 tablets by mouth every 6 (six) hours as needed. To be taken after surgery   pantoprazole 40 MG tablet Commonly known as: PROTONIX Take 1 tablet (40 mg total) by mouth daily.   rivaroxaban 10 MG Tabs tablet Commonly known as: XARELTO Take 1 tablet (10 mg total) by mouth daily. To be taken after surgery (instead of aspirin) to prevent blood clots   sertraline 50 MG tablet Commonly known as: ZOLOFT Take 1 tablet (50 mg total) by mouth daily.   traZODone 100 MG tablet Commonly known as: DESYREL Take 1 tablet (100 mg total) by mouth at bedtime.   VITAMIN D (CHOLECALCIFEROL) PO Take 1 capsule by mouth daily.               Durable Medical Equipment  (From admission, onward)           Start     Ordered   06/20/22 1547  DME Walker rolling  Once       Question Answer Comment  Walker: With 5 Inch Wheels   Patient needs a walker to treat with the following condition Status post left partial knee replacement      06/20/22 1546   06/20/22 1547  DME 3 n 1  Once        06/20/22 1546   06/20/22 1547  DME Bedside commode  Once       Question:  Patient needs a bedside commode to treat with the  following condition  Answer:  Status  post left partial knee replacement   06/20/22 1546            Diagnostic Studies: DG Knee Left Port  Result Date: 06/20/2022 CLINICAL DATA:  Left total knee replacement. EXAM: PORTABLE LEFT KNEE - 1-2 VIEW COMPARISON:  Left knee radiographs 05/10/2022 FINDINGS: Sequelae of interval total knee arthroplasty are identified. No acute fracture or dislocation is identified. Gas is noted in the knee joint and surrounding soft tissues consistent with recent surgery. IMPRESSION: Interval left total knee arthroplasty. No evidence of acute complication. Electronically Signed   By: Logan Bores M.D.   On: 06/20/2022 15:08   MR Cervical Spine w/o contrast  Result Date: 06/19/2022 CLINICAL DATA:  Right-sided neck pain for 1 year. No previous relevant surgery. EXAM: MRI CERVICAL SPINE WITHOUT CONTRAST TECHNIQUE: Multiplanar, multisequence MR imaging of the cervical spine was performed. No intravenous contrast was administered. COMPARISON:  Limited correlation made with cervical MRI 03/31/2017. FINDINGS: Alignment: Straightening without focal angulation or significant listhesis. Vertebrae: No acute or suspicious osseous findings. There is a hemangioma within the right C5 articulating facet. Cord: Normal in signal and caliber. Posterior Fossa, vertebral arteries, paraspinal tissues: Visualized portions of the posterior fossa appear unremarkable.Bilateral vertebral artery flow voids. No significant paraspinal findings. Disc levels: C2-3: Normal interspace. C3-4: Preserved disc height. Mild uncinate spurring and facet hypertrophy on the left contribute to mild left foraminal narrowing. No central spinal stenosis. C4-5: Shallow central disc protrusion with mild asymmetric facet hypertrophy on the left. Mild left foraminal narrowing. No spinal stenosis. C5-6: No significant findings. C6-7: Mild disc bulging with a left foraminal disc osteophyte complex. Resulting left foraminal narrowing and possible left C7 nerve root  encroachment. No central stenosis. C7-T1: Normal interspace. IMPRESSION: 1. No acute findings or clear explanation for the patient's symptoms. No significant) 2. Mild left foraminal narrowing at C3-4 and C4-5 due to uncinate spurring and facet hypertrophy. 3. Left foraminal disc osteophyte complex at C6-7 with resulting left foraminal narrowing and possible left C7 nerve root encroachment. Electronically Signed   By: Richardean Sale M.D.   On: 06/19/2022 13:54    Disposition:      Follow-up Information     Leandrew Koyanagi, MD. Schedule an appointment as soon as possible for a visit in 2 week(s).   Specialty: Orthopedic Surgery Contact information: [address] [place] [address] [phone]                  Signed: Aundra Dubin 06/21/2022, 9:51 AM

## 2022-06-21 NOTE — Progress Notes (Signed)
Subjective: 1 Day Post-Op Procedure(s) (LRB): LEFT TOTAL KNEE REPLACEMENT (Left) Patient reports pain as moderate.  Has been nauseous and has vomited a few times this am.    Objective: Vital signs in last 24 hours: Temp:  [97.3 F (36.3 C)-98.4 F (36.9 C)] 97.3 F (36.3 C) (02/27 0752) Pulse Rate:  [77-101] 91 (02/27 0752) Resp:  [10-20] 17 (02/27 0752) BP: (116-143)/(57-82) 134/66 (02/27 0752) SpO2:  [92 %-97 %] 95 % (02/27 0752) FiO2 (%):  [21 %] 21 % (02/26 1547) Weight:  [63.5 kg] 63.5 kg (02/26 1030)  Intake/Output from previous day: 02/26 0701 - 02/27 0700 In: 1895.2 [I.V.:1176.6; IV Piggyback:718.6] Out: 1050 [Urine:1000; Blood:50] Intake/Output this shift: No intake/output data recorded.  Recent Labs    06/21/22 0149  HGB 10.7*   Recent Labs    06/21/22 0149  WBC 6.2  RBC 3.68*  HCT 33.3*  PLT 193   No results for input(s): "NA", "K", "CL", "CO2", "BUN", "CREATININE", "GLUCOSE", "CALCIUM" in the last 72 hours. No results for input(s): "LABPT", "INR" in the last 72 hours.  Neurologically intact Neurovascular intact Sensation intact distally Intact pulses distally Dorsiflexion/Plantar flexion intact Incision: dressing C/D/I No cellulitis present Compartment soft   Assessment/Plan: 1 Day Post-Op Procedure(s) (LRB): LEFT TOTAL KNEE REPLACEMENT (Left) Advance diet Up with therapy Discharge home with home health once cleared by PT AND nausea/vomiting resolves.  Please call office for d/c order if today WBAT LLE ABLA- mild and stable        Aundra Dubin 06/21/2022, 9:49 AM

## 2022-06-22 ENCOUNTER — Telehealth: Payer: Self-pay

## 2022-06-22 ENCOUNTER — Other Ambulatory Visit: Payer: Self-pay | Admitting: Physician Assistant

## 2022-06-22 ENCOUNTER — Encounter (HOSPITAL_COMMUNITY): Payer: Self-pay | Admitting: Orthopaedic Surgery

## 2022-06-22 DIAGNOSIS — K219 Gastro-esophageal reflux disease without esophagitis: Secondary | ICD-10-CM | POA: Diagnosis not present

## 2022-06-22 DIAGNOSIS — M810 Age-related osteoporosis without current pathological fracture: Secondary | ICD-10-CM | POA: Diagnosis not present

## 2022-06-22 DIAGNOSIS — Z471 Aftercare following joint replacement surgery: Secondary | ICD-10-CM | POA: Diagnosis not present

## 2022-06-22 DIAGNOSIS — Z7901 Long term (current) use of anticoagulants: Secondary | ICD-10-CM | POA: Diagnosis not present

## 2022-06-22 DIAGNOSIS — Z96652 Presence of left artificial knee joint: Secondary | ICD-10-CM | POA: Diagnosis not present

## 2022-06-22 DIAGNOSIS — Z96651 Presence of right artificial knee joint: Secondary | ICD-10-CM | POA: Diagnosis not present

## 2022-06-22 DIAGNOSIS — M25511 Pain in right shoulder: Secondary | ICD-10-CM | POA: Diagnosis not present

## 2022-06-22 DIAGNOSIS — E871 Hypo-osmolality and hyponatremia: Secondary | ICD-10-CM | POA: Diagnosis not present

## 2022-06-22 DIAGNOSIS — Z791 Long term (current) use of non-steroidal anti-inflammatories (NSAID): Secondary | ICD-10-CM | POA: Diagnosis not present

## 2022-06-22 DIAGNOSIS — Z9181 History of falling: Secondary | ICD-10-CM | POA: Diagnosis not present

## 2022-06-22 DIAGNOSIS — I10 Essential (primary) hypertension: Secondary | ICD-10-CM | POA: Diagnosis not present

## 2022-06-22 DIAGNOSIS — G8929 Other chronic pain: Secondary | ICD-10-CM | POA: Diagnosis not present

## 2022-06-22 DIAGNOSIS — M4726 Other spondylosis with radiculopathy, lumbar region: Secondary | ICD-10-CM | POA: Diagnosis not present

## 2022-06-22 MED ORDER — PROMETHAZINE HCL 12.5 MG PO TABS: 12.5000 mg | ORAL_TABLET | Freq: Four times a day (QID) | ORAL | 0 refills | Status: DC | PRN

## 2022-06-22 MED ORDER — HYDROCODONE-ACETAMINOPHEN 5-325 MG PO TABS: 1.0000 | ORAL_TABLET | Freq: Three times a day (TID) | ORAL | 0 refills | Status: DC | PRN

## 2022-06-22 NOTE — Telephone Encounter (Signed)
Called and relayed information to patient's son.

## 2022-06-22 NOTE — Telephone Encounter (Signed)
Amber Mccall with [facility] called stating that patient has been vomiting 3-4 times since yesterday.  Would like a Rx for nausea sent to her pharmacy.  Patient had left knee surgery on 06/20/2022.  Cb# [phone].  Please advise.  Thank you.

## 2022-06-22 NOTE — Telephone Encounter (Signed)
I hate that they discharged her if she was still vomiting.  Zofran was sent in prior to surgery, but if that is not working, she can take phergan.  I also sent in norco to try instead of oxycodone just in case that is what is making her sick

## 2022-06-22 NOTE — Telephone Encounter (Signed)
I would try taking the  muscle relaxer three to four times per day instead of twice per day to see if that helps.

## 2022-06-23 ENCOUNTER — Telehealth: Payer: Self-pay | Admitting: *Deleted

## 2022-06-23 DIAGNOSIS — H04203 Unspecified epiphora, bilateral lacrimal glands: Secondary | ICD-10-CM | POA: Diagnosis not present

## 2022-06-23 DIAGNOSIS — H02403 Unspecified ptosis of bilateral eyelids: Secondary | ICD-10-CM | POA: Diagnosis not present

## 2022-06-23 NOTE — Telephone Encounter (Signed)
Ortho bundle call completed. 

## 2022-06-24 ENCOUNTER — Other Ambulatory Visit: Payer: Self-pay | Admitting: *Deleted

## 2022-06-24 ENCOUNTER — Telehealth: Payer: Self-pay

## 2022-06-24 ENCOUNTER — Telehealth: Payer: Self-pay | Admitting: *Deleted

## 2022-06-24 ENCOUNTER — Other Ambulatory Visit: Payer: Self-pay | Admitting: Physician Assistant

## 2022-06-24 ENCOUNTER — Ambulatory Visit (HOSPITAL_COMMUNITY)
Admission: RE | Admit: 2022-06-24 | Discharge: 2022-06-24 | Disposition: A | Discharge status: Home / Self Care | Payer: 59 | Source: Ambulatory Visit | Attending: Orthopaedic Surgery | Admitting: Orthopaedic Surgery

## 2022-06-24 DIAGNOSIS — Z791 Long term (current) use of non-steroidal anti-inflammatories (NSAID): Secondary | ICD-10-CM | POA: Diagnosis not present

## 2022-06-24 DIAGNOSIS — Z96652 Presence of left artificial knee joint: Secondary | ICD-10-CM

## 2022-06-24 DIAGNOSIS — Z9181 History of falling: Secondary | ICD-10-CM | POA: Diagnosis not present

## 2022-06-24 DIAGNOSIS — Z471 Aftercare following joint replacement surgery: Secondary | ICD-10-CM | POA: Diagnosis not present

## 2022-06-24 DIAGNOSIS — G8929 Other chronic pain: Secondary | ICD-10-CM | POA: Diagnosis not present

## 2022-06-24 DIAGNOSIS — M4726 Other spondylosis with radiculopathy, lumbar region: Secondary | ICD-10-CM | POA: Diagnosis not present

## 2022-06-24 DIAGNOSIS — K219 Gastro-esophageal reflux disease without esophagitis: Secondary | ICD-10-CM | POA: Diagnosis not present

## 2022-06-24 DIAGNOSIS — Z7901 Long term (current) use of anticoagulants: Secondary | ICD-10-CM | POA: Diagnosis not present

## 2022-06-24 DIAGNOSIS — Z96651 Presence of right artificial knee joint: Secondary | ICD-10-CM | POA: Diagnosis not present

## 2022-06-24 DIAGNOSIS — E871 Hypo-osmolality and hyponatremia: Secondary | ICD-10-CM | POA: Diagnosis not present

## 2022-06-24 DIAGNOSIS — M810 Age-related osteoporosis without current pathological fracture: Secondary | ICD-10-CM | POA: Diagnosis not present

## 2022-06-24 DIAGNOSIS — I10 Essential (primary) hypertension: Secondary | ICD-10-CM | POA: Diagnosis not present

## 2022-06-24 DIAGNOSIS — M25511 Pain in right shoulder: Secondary | ICD-10-CM | POA: Diagnosis not present

## 2022-06-24 MED ORDER — HYDROMORPHONE HCL 2 MG PO TABS: 2.0000 mg | ORAL_TABLET | Freq: Four times a day (QID) | ORAL | 0 refills | Status: DC | PRN

## 2022-06-24 NOTE — Telephone Encounter (Signed)
Ok, great! Thank you

## 2022-06-24 NOTE — Telephone Encounter (Signed)
Thanks for the update.  I have sent in dilaudid to take instead of norco/oxycodone.  Lets order doppler u/s  LLE to r/o dvt.  If having chest pain/sob, go to ED

## 2022-06-24 NOTE — Telephone Encounter (Signed)
Amber Mccall with [facility] called. Patient needs a script for new thigh high compression stockings. I have left a script at the front desk for pick up. She will let patient know before she leaves their office.

## 2022-06-24 NOTE — Telephone Encounter (Signed)
Call from treating [facility] therapist today who is in the home and states that patient's dressing is 40-50% saturated. Gave orders to change and cover with gauze dressing. RNCM will leave new Aquacel at front desk to be picked up by son and can have therapy change on Monday back to Aquacel. Also if patient comes here today with son, CM could change here in office. Pain is 9/10 with taking Norco. Not having any more Nausea. Also therapist states she has a + Homan's sign and is very swollen. I can order LLE doppler per your verbal order. Thanks.

## 2022-06-27 DIAGNOSIS — M25511 Pain in right shoulder: Secondary | ICD-10-CM | POA: Diagnosis not present

## 2022-06-27 DIAGNOSIS — Z96652 Presence of left artificial knee joint: Secondary | ICD-10-CM | POA: Diagnosis not present

## 2022-06-27 DIAGNOSIS — M810 Age-related osteoporosis without current pathological fracture: Secondary | ICD-10-CM | POA: Diagnosis not present

## 2022-06-27 DIAGNOSIS — G8929 Other chronic pain: Secondary | ICD-10-CM | POA: Diagnosis not present

## 2022-06-27 DIAGNOSIS — K219 Gastro-esophageal reflux disease without esophagitis: Secondary | ICD-10-CM | POA: Diagnosis not present

## 2022-06-27 DIAGNOSIS — Z96651 Presence of right artificial knee joint: Secondary | ICD-10-CM | POA: Diagnosis not present

## 2022-06-27 DIAGNOSIS — I10 Essential (primary) hypertension: Secondary | ICD-10-CM | POA: Diagnosis not present

## 2022-06-27 DIAGNOSIS — M4726 Other spondylosis with radiculopathy, lumbar region: Secondary | ICD-10-CM | POA: Diagnosis not present

## 2022-06-27 DIAGNOSIS — Z7901 Long term (current) use of anticoagulants: Secondary | ICD-10-CM | POA: Diagnosis not present

## 2022-06-27 DIAGNOSIS — Z9181 History of falling: Secondary | ICD-10-CM | POA: Diagnosis not present

## 2022-06-27 DIAGNOSIS — E871 Hypo-osmolality and hyponatremia: Secondary | ICD-10-CM | POA: Diagnosis not present

## 2022-06-27 DIAGNOSIS — Z791 Long term (current) use of non-steroidal anti-inflammatories (NSAID): Secondary | ICD-10-CM | POA: Diagnosis not present

## 2022-06-27 DIAGNOSIS — Z471 Aftercare following joint replacement surgery: Secondary | ICD-10-CM | POA: Diagnosis not present

## 2022-06-28 ENCOUNTER — Other Ambulatory Visit: Payer: Self-pay | Admitting: *Deleted

## 2022-06-28 ENCOUNTER — Telehealth: Payer: Self-pay | Admitting: *Deleted

## 2022-06-28 DIAGNOSIS — Z96652 Presence of left artificial knee joint: Secondary | ICD-10-CM

## 2022-06-28 NOTE — Telephone Encounter (Signed)
Ortho bundle 7 day call completed.

## 2022-06-29 DIAGNOSIS — Z96652 Presence of left artificial knee joint: Secondary | ICD-10-CM | POA: Diagnosis not present

## 2022-06-29 DIAGNOSIS — M25511 Pain in right shoulder: Secondary | ICD-10-CM | POA: Diagnosis not present

## 2022-06-29 DIAGNOSIS — E871 Hypo-osmolality and hyponatremia: Secondary | ICD-10-CM | POA: Diagnosis not present

## 2022-06-29 DIAGNOSIS — G8929 Other chronic pain: Secondary | ICD-10-CM | POA: Diagnosis not present

## 2022-06-29 DIAGNOSIS — Z7901 Long term (current) use of anticoagulants: Secondary | ICD-10-CM | POA: Diagnosis not present

## 2022-06-29 DIAGNOSIS — M4726 Other spondylosis with radiculopathy, lumbar region: Secondary | ICD-10-CM | POA: Diagnosis not present

## 2022-06-29 DIAGNOSIS — Z471 Aftercare following joint replacement surgery: Secondary | ICD-10-CM | POA: Diagnosis not present

## 2022-06-29 DIAGNOSIS — M810 Age-related osteoporosis without current pathological fracture: Secondary | ICD-10-CM | POA: Diagnosis not present

## 2022-06-29 DIAGNOSIS — I10 Essential (primary) hypertension: Secondary | ICD-10-CM | POA: Diagnosis not present

## 2022-06-29 DIAGNOSIS — Z791 Long term (current) use of non-steroidal anti-inflammatories (NSAID): Secondary | ICD-10-CM | POA: Diagnosis not present

## 2022-06-29 DIAGNOSIS — K219 Gastro-esophageal reflux disease without esophagitis: Secondary | ICD-10-CM | POA: Diagnosis not present

## 2022-06-29 DIAGNOSIS — Z96651 Presence of right artificial knee joint: Secondary | ICD-10-CM | POA: Diagnosis not present

## 2022-06-29 DIAGNOSIS — Z9181 History of falling: Secondary | ICD-10-CM | POA: Diagnosis not present

## 2022-07-01 DIAGNOSIS — Z96651 Presence of right artificial knee joint: Secondary | ICD-10-CM | POA: Diagnosis not present

## 2022-07-01 DIAGNOSIS — Z9181 History of falling: Secondary | ICD-10-CM | POA: Diagnosis not present

## 2022-07-01 DIAGNOSIS — Z471 Aftercare following joint replacement surgery: Secondary | ICD-10-CM | POA: Diagnosis not present

## 2022-07-01 DIAGNOSIS — M4726 Other spondylosis with radiculopathy, lumbar region: Secondary | ICD-10-CM | POA: Diagnosis not present

## 2022-07-01 DIAGNOSIS — Z96652 Presence of left artificial knee joint: Secondary | ICD-10-CM | POA: Diagnosis not present

## 2022-07-01 DIAGNOSIS — Z7901 Long term (current) use of anticoagulants: Secondary | ICD-10-CM | POA: Diagnosis not present

## 2022-07-01 DIAGNOSIS — M25511 Pain in right shoulder: Secondary | ICD-10-CM | POA: Diagnosis not present

## 2022-07-01 DIAGNOSIS — M810 Age-related osteoporosis without current pathological fracture: Secondary | ICD-10-CM | POA: Diagnosis not present

## 2022-07-01 DIAGNOSIS — E871 Hypo-osmolality and hyponatremia: Secondary | ICD-10-CM | POA: Diagnosis not present

## 2022-07-01 DIAGNOSIS — K219 Gastro-esophageal reflux disease without esophagitis: Secondary | ICD-10-CM | POA: Diagnosis not present

## 2022-07-01 DIAGNOSIS — I10 Essential (primary) hypertension: Secondary | ICD-10-CM | POA: Diagnosis not present

## 2022-07-01 DIAGNOSIS — Z791 Long term (current) use of non-steroidal anti-inflammatories (NSAID): Secondary | ICD-10-CM | POA: Diagnosis not present

## 2022-07-01 DIAGNOSIS — G8929 Other chronic pain: Secondary | ICD-10-CM | POA: Diagnosis not present

## 2022-07-04 ENCOUNTER — Telehealth: Payer: Self-pay | Admitting: Orthopaedic Surgery

## 2022-07-04 DIAGNOSIS — M810 Age-related osteoporosis without current pathological fracture: Secondary | ICD-10-CM | POA: Diagnosis not present

## 2022-07-04 DIAGNOSIS — E871 Hypo-osmolality and hyponatremia: Secondary | ICD-10-CM | POA: Diagnosis not present

## 2022-07-04 DIAGNOSIS — Z471 Aftercare following joint replacement surgery: Secondary | ICD-10-CM | POA: Diagnosis not present

## 2022-07-04 DIAGNOSIS — Z9181 History of falling: Secondary | ICD-10-CM | POA: Diagnosis not present

## 2022-07-04 DIAGNOSIS — M4726 Other spondylosis with radiculopathy, lumbar region: Secondary | ICD-10-CM | POA: Diagnosis not present

## 2022-07-04 DIAGNOSIS — M25511 Pain in right shoulder: Secondary | ICD-10-CM | POA: Diagnosis not present

## 2022-07-04 DIAGNOSIS — G8929 Other chronic pain: Secondary | ICD-10-CM | POA: Diagnosis not present

## 2022-07-04 DIAGNOSIS — Z96652 Presence of left artificial knee joint: Secondary | ICD-10-CM | POA: Diagnosis not present

## 2022-07-04 DIAGNOSIS — Z7901 Long term (current) use of anticoagulants: Secondary | ICD-10-CM | POA: Diagnosis not present

## 2022-07-04 DIAGNOSIS — K219 Gastro-esophageal reflux disease without esophagitis: Secondary | ICD-10-CM | POA: Diagnosis not present

## 2022-07-04 DIAGNOSIS — Z791 Long term (current) use of non-steroidal anti-inflammatories (NSAID): Secondary | ICD-10-CM | POA: Diagnosis not present

## 2022-07-04 DIAGNOSIS — I10 Essential (primary) hypertension: Secondary | ICD-10-CM | POA: Diagnosis not present

## 2022-07-04 DIAGNOSIS — Z96651 Presence of right artificial knee joint: Secondary | ICD-10-CM | POA: Diagnosis not present

## 2022-07-04 NOTE — Telephone Encounter (Signed)
FYI.......Marland KitchenPatient being discharged [facility] today and starting outpatient care Wednesday

## 2022-07-05 ENCOUNTER — Ambulatory Visit (INDEPENDENT_AMBULATORY_CARE_PROVIDER_SITE_OTHER): Payer: 59 | Admitting: Orthopaedic Surgery

## 2022-07-05 DIAGNOSIS — S46811A Strain of other muscles, fascia and tendons at shoulder and upper arm level, right arm, initial encounter: Secondary | ICD-10-CM

## 2022-07-05 DIAGNOSIS — Z96652 Presence of left artificial knee joint: Secondary | ICD-10-CM | POA: Diagnosis not present

## 2022-07-05 MED ORDER — OXYCODONE-ACETAMINOPHEN 5-325 MG PO TABS: 1.0000 | ORAL_TABLET | Freq: Two times a day (BID) | ORAL | 0 refills | Status: DC | PRN

## 2022-07-05 NOTE — Progress Notes (Signed)
Post-Op Visit Note   Patient: Amber Mccall           Date of Birth: 1950-11-15           MRN: [id number] Visit Date: 07/05/2022 PCP: Shelda Pal, DO   Assessment & Plan:  Chief Complaint:  Chief Complaint  Patient presents with   Left Knee - Routine Post Op   Visit Diagnoses:  1. Status post total left knee replacement   2. Strain of right trapezius muscle, initial encounter     Plan: Ms. Kraner is 2 weeks status post left total knee replacement.  Ultrasound was negative for DVT.  She is complaining of expected postoperative symptoms.  Also complains of ongoing right scapular and trapezial shoulder pain.  Physical therapy and dry needling have not been effective.  C-spine MRI was unrevealing.  Examination left knee shows healed surgical incision.  No drainage or signs of infection.  Expected postoperative bruising and swelling.  Calf is nontender.  Range of motion progressing nicely. Examination of right shoulder shows muscular tenderness in the right trapezial muscle region.  Shoulder range of motion is normal.  In regards to the left knee replacement she is progressing well.  Sutures removed Steri-Strips applied.  She starts outpatient PT tomorrow.  Percocet refilled. In regards to the right shoulder pain C-spine MRI was reviewed which is unrevealing for her symptoms.  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  Would recommend massage therapy.  Also talked about the importance of posture and stress.  Recheck in 4 weeks with two-view x-rays of the left knee for follow-up on the knee replacement.  Follow-Up Instructions: Return in about 4 weeks (around 08/02/2022).   Orders:  No orders of the defined types were placed in this encounter.  Meds ordered this encounter  Medications   oxyCODONE-acetaminophen (PERCOCET) 5-325 MG tablet    Sig: Take 1-2 tablets by mouth 2 (two) times daily as needed. To be taken after surgery    Dispense:  30 tablet     Refill:  0    Imaging: No results found.  PMFS History: Patient Active Problem List   Diagnosis Date Noted   Status post total left knee replacement 06/20/2022   Status post total right knee replacement 10/18/2021   Primary osteoarthritis of right knee 10/17/2021   Spondylosis without myelopathy or radiculopathy, lumbar region 09/28/2021   Chronic right shoulder pain 08/18/2021   Adhesive capsulitis of right shoulder 08/18/2021   Gastroesophageal reflux disease 08/18/2021   Primary osteoarthritis of left knee 07/27/2021   Primary osteoarthritis involving multiple joints 07/13/2021   Strain of right trapezius muscle 07/13/2021   Essential hypertension 05/19/2021   Hypertriglyceridemia 05/19/2021   Osteoporosis 05/19/2021   Lumbar radiculopathy 05/19/2021   Osteoarthritis 05/19/2021   Depression, major, single episode, in partial remission ([place]) 05/19/2021   Chronic right-sided low back pain 05/19/2021   Status post left foot surgery 04/23/2015   Injury of foot, left 04/08/2015   Sprain of ankle 04/08/2015   Deformity of metatarsal bone of left foot 04/08/2015   Metatarsalgia of left foot 02/10/2015   Tenosynovitis of foot 02/10/2015   Chronic pain of both knees 02/10/2015   Closed fracture of metatarsal bone 10/21/2014   Edema of left foot 10/21/2014   Closed fracture of metatarsal bone(s) 01/30/2013   Pain, foot 01/30/2013   Abnormal foot finding 01/30/2013   Past Medical History:  Diagnosis Date   Anginal pain ([place])    pt says she  feels like this may be related to acid reflux   Closed fracture of metatarsal bone(s) 01/30/2013   Depression    Essential hypertension    GERD (gastroesophageal reflux disease)    Lumbar radiculopathy    Osteoarthritis    Osteoporosis     Family History  Problem Relation Age of Onset   Colon cancer Neg Hx    Rectal cancer Neg Hx    Stomach cancer Neg Hx     Past Surgical History:  Procedure Laterality Date   ABDOMINAL  HYSTERECTOMY     APPENDECTOMY     CATARACT EXTRACTION Bilateral 2022   [place] Osteotomy w/ Bone Graft Left 04/16/2015   Left foot   IR RADIOLOGIST EVAL & MGMT  04/11/2017   OPEN REDUCTION INTERNAL FIXATION (ORIF) DISTAL PHALANX Left 10/23/2014   5th Metatarsal   TOTAL KNEE ARTHROPLASTY Right 10/18/2021   Procedure: RIGHT TOTAL KNEE ARTHROPLASTY;  Surgeon: Leandrew Koyanagi, MD;  Location: [facility];  Service: Orthopedics;  Laterality: Right;   TOTAL KNEE ARTHROPLASTY Left 06/20/2022   Procedure: LEFT TOTAL KNEE REPLACEMENT;  Surgeon: Leandrew Koyanagi, MD;  Location: [facility];  Service: Orthopedics;  Laterality: Left;   Social History   Occupational History   Occupation: retired  Tobacco Use   Smoking status: Never   Smokeless tobacco: Never  Vaping Use   Vaping Use: Never used  Substance and Sexual Activity   Alcohol use: Not Currently   Drug use: Not Currently   Sexual activity: Not on file

## 2022-07-06 ENCOUNTER — Other Ambulatory Visit: Payer: Self-pay

## 2022-07-06 ENCOUNTER — Ambulatory Visit: Payer: 59 | Attending: Orthopaedic Surgery | Admitting: Rehabilitative and Restorative Service Providers"

## 2022-07-06 ENCOUNTER — Encounter: Payer: Self-pay | Admitting: Rehabilitative and Restorative Service Providers"

## 2022-07-06 DIAGNOSIS — Z96652 Presence of left artificial knee joint: Secondary | ICD-10-CM | POA: Insufficient documentation

## 2022-07-06 DIAGNOSIS — M6281 Muscle weakness (generalized): Secondary | ICD-10-CM | POA: Insufficient documentation

## 2022-07-06 DIAGNOSIS — R600 Localized edema: Secondary | ICD-10-CM | POA: Insufficient documentation

## 2022-07-06 DIAGNOSIS — R2689 Other abnormalities of gait and mobility: Secondary | ICD-10-CM | POA: Insufficient documentation

## 2022-07-06 DIAGNOSIS — M25562 Pain in left knee: Secondary | ICD-10-CM | POA: Diagnosis not present

## 2022-07-06 NOTE — Therapy (Signed)
OUTPATIENT PHYSICAL THERAPY LOWER EXTREMITY EVALUATION  Patient Name: Amber Mccall MRN: [id number] DOB:01-May-1950, 72 y.o., female Today's Date: 07/06/2022  END OF SESSION:  PT End of Session - 07/06/22 0842     Visit Number 1    Number of Visits 16    Date for PT Re-Evaluation 09/04/22    Authorization Type UHC medicare/medicaid    PT Start Time 0845    PT Stop Time 0930    PT Time Calculation (min) 45 min    Activity Tolerance Patient tolerated treatment well    Behavior During Therapy [facility] for tasks assessed/performed             Past Medical History:  Diagnosis Date   Anginal pain ([place])    pt says she feels like this may be related to acid reflux   Closed fracture of metatarsal bone(s) 01/30/2013   Depression    Essential hypertension    GERD (gastroesophageal reflux disease)    Lumbar radiculopathy    Osteoarthritis    Osteoporosis    Past Surgical History:  Procedure Laterality Date   ABDOMINAL HYSTERECTOMY     APPENDECTOMY     CATARACT EXTRACTION Bilateral 2022   [place]ton Osteotomy w/ Bone Graft Left 04/16/2015   Left foot   IR RADIOLOGIST EVAL & MGMT  04/11/2017   OPEN REDUCTION INTERNAL FIXATION (ORIF) DISTAL PHALANX Left 10/23/2014   5th Metatarsal   TOTAL KNEE ARTHROPLASTY Right 10/18/2021   Procedure: RIGHT TOTAL KNEE ARTHROPLASTY;  Surgeon: Leandrew Koyanagi, MD;  Location: [place];  Service: Orthopedics;  Laterality: Right;   TOTAL KNEE ARTHROPLASTY Left 06/20/2022   Procedure: LEFT TOTAL KNEE REPLACEMENT;  Surgeon: Leandrew Koyanagi, MD;  Location: [place];  Service: Orthopedics;  Laterality: Left;   Patient Active Problem List   Diagnosis Date Noted   Status post total left knee replacement 06/20/2022   Status post total right knee replacement 10/18/2021   Primary osteoarthritis of right knee 10/17/2021   Spondylosis without myelopathy or radiculopathy, lumbar region 09/28/2021   Chronic right shoulder pain 08/18/2021   Adhesive capsulitis of right shoulder  08/18/2021   Gastroesophageal reflux disease 08/18/2021   Primary osteoarthritis of left knee 07/27/2021   Primary osteoarthritis involving multiple joints 07/13/2021   Strain of right trapezius muscle 07/13/2021   Essential hypertension 05/19/2021   Hypertriglyceridemia 05/19/2021   Osteoporosis 05/19/2021   Lumbar radiculopathy 05/19/2021   Osteoarthritis 05/19/2021   Depression, major, single episode, in partial remission ([place]) 05/19/2021   Chronic right-sided low back pain 05/19/2021   Status post left foot surgery 04/23/2015   Injury of foot, left 04/08/2015   Sprain of ankle 04/08/2015   Deformity of metatarsal bone of left foot 04/08/2015   Metatarsalgia of left foot 02/10/2015   Tenosynovitis of foot 02/10/2015   Chronic pain of both knees 02/10/2015   Closed fracture of metatarsal bone 10/21/2014   Edema of left foot 10/21/2014   Closed fracture of metatarsal bone(s) 01/30/2013   Pain, foot 01/30/2013   Abnormal foot finding 01/30/2013    PCP: Riki Sheer DO REFERRING PROVIDER: Frankey Shown REFERRING DIAG: L TKR  THERAPY DIAG:  Muscle weakness (generalized)  Acute pain of left knee  Other abnormalities of gait and mobility  Localized edema  Rationale for Evaluation and Treatment: Rehabilitation  ONSET DATE: 06/20/22 L TKR  SUBJECTIVE:   SUBJECTIVE STATEMENT: The patient is s/p L TKR on 06/20/22. She has had [facility] and finished rehab on Monday. She reports she  continues to have bleeding along her incision-- physician assessed on Tuesday.  Interpreter attends today's session via stratus tablet.  PERTINENT HISTORY: HTN, 2016 L foot surgery, prior R TKR, 2x back surgery, and appendectomy, and hysterectomy, and cateract surgery.  PAIN:  Are you having pain? Yes: NPRS scale: 8/10 Pain location: L knee Pain description: surgical pain, acute pain Aggravating factors: always there Relieving factors: rest, ice  PRECAUTIONS: None  WEIGHT BEARING  RESTRICTIONS:  WBAT  FALLS:  Has patient fallen in last 6 months? No  LIVING ENVIRONMENT: Lives with: lives with their family Lives in: House/apartment Stairs: Yes: External: 3 steps; one Has following equipment at home: [place] - 4 wheeled  OCCUPATION: retired  PLOF: Independent  PATIENT GOALS: no specific goal. Relieve the pain and return to daily life.   NEXT MD VISIT: unsure  OBJECTIVE:   PATIENT SURVEYS:  N/a due to interpreter services  SENSATION: WFL  EDEMA:  Circumferential: 39cm on L and 37 on R Bruising along lateral proximal thigh Incision viewed with no drainage noted  POSTURE:  maintains L knee flexion in standing  PALPATION: Tender to palpation in lateral quad and along knee  LOWER EXTREMITY ROM:  Active ROM Right eval Left eval  Knee flexion 115 98  Knee extension 0 10 Long arc quad extensor lag   LOWER EXTREMITY MMT:  MMT Right eval Left eval  Knee flexion  3/5  Knee extension  3/5  Ankle dorsiflexion    Ankle plantarflexion    Ankle inversion    Ankle eversion     (Blank rows = not tested)  FUNCTIONAL TESTS:  5 times sit to stand: 24.08 seconds 10 meter walk test: 1.96 ft/sec gait speed  GAIT: Distance walked: 100 ft Assistive device utilized: Environmental consultant - 4 wheeled Level of assistance: Modified independence Comments: dec'd L knee extension and dec'd heel strike Stairs mod indep with 2 rails step to pattern  TODAY'S TREATMENT:                                                                                                                              DATE: 07/06/22  See HEP below  PATIENT EDUCATION:  Education details: HEP Person educated: Patient Education method: Explanation, Demonstration, and Handouts Education comprehension: verbalized understanding and returned demonstration  HOME EXERCISE PROGRAM: Access Code: [email or web address] URL: [email or web address] Date: 07/06/2022 Prepared by: Rudell Cobb  Exercises - Quad Setting and Stretching  - 2 x daily - 7 x weekly - 1 sets - 10 reps - Supine Heel Slide  - 2 x daily - 7 x weekly - 1 sets - 10 reps - Supine Active Straight Leg Raise  - 2 x daily - 7 x weekly - 1 sets - 10 reps - Seated Knee Flexion Extension AROM   - 2 x daily - 7 x weekly - 1 sets - 10 reps - Seated Long Arc Quad  - 2 x daily - 7 x weekly -  1 sets - 10 reps  ASSESSMENT:  CLINICAL IMPRESSION: Patient is a 72 y.o. female who was seen today for physical therapy evaluation and treatment s/p L TKR. She presents with impairments in L knee A/ROM, P/ROM, muscle strength, gait deficits, imbalance, and pain limiting function. PT to address in order to return to prior level of function.   OBJECTIVE IMPAIRMENTS: Abnormal gait, decreased activity tolerance, decreased balance, decreased mobility, difficulty walking, decreased ROM, decreased strength, increased edema, impaired flexibility, and pain.   ACTIVITY LIMITATIONS: bending, squatting, stairs, and locomotion level  PARTICIPATION LIMITATIONS: community activity  PERSONAL FACTORS: 1 comorbidity: HTN  are also affecting patient's functional outcome.   REHAB POTENTIAL: Good  CLINICAL DECISION MAKING: Stable/uncomplicated  EVALUATION COMPLEXITY: Low   GOALS: Goals reviewed with patient? Yes   SHORT TERM GOALS: Target date: 08/03/2022    Independent with initial HEP. Baseline: initiated today Goal status: INITIAL  2.  Reduce pain to < or equal to 4/10. Baseline: 8/10 Goal status: INITIAL  LONG TERM GOALS: Target date: 08/31/2022   Independent with advanced/ongoing HEP to improve outcomes and carryover.  Baseline: initiated HEP today Goal status: INITIAL  2.  Patient will demonstrate Left knee flexion to 115 deg to ascend/descend stairs. Baseline: 98 Goal status: INITIAL  3.  Patient will demonstrate full left knee extension for safety with gait. Baseline: 10 degrees Goal status: INITIAL  4.  Patient  will be able to ambulate 400' safely with LRAD and normal gait pattern to access community.  Baseline: 100 ft Goal status: INITIAL  5.  Patient will be able to ascend/descend stairs with 1 HR and reciprocal step pattern safely to access home and community.  Baseline: 2 rails with step to pattern Goal status: INITIAL  6.  Patient will report no pain L knee. Baseline: 8/10 Goal status: INITIAL  PLAN:  PT FREQUENCY: 2x/week  PT DURATION: 8 weeks  PLANNED INTERVENTIONS: Therapeutic exercises, Therapeutic activity, Neuromuscular re-education, Balance training, Gait training, Patient/Family education, Self Care, Joint mobilization, Stair training, DME instructions, Dry Needling, Electrical stimulation, Cryotherapy, Vasopneumatic device, and Manual therapy  PLAN FOR NEXT SESSION: Progress HEP, add more standing there ex, LE ROM and strengthening, gait training to normalize pattern.   [place], PT 07/06/2022, 9:30 AM

## 2022-07-08 ENCOUNTER — Ambulatory Visit: Payer: 59

## 2022-07-08 DIAGNOSIS — R600 Localized edema: Secondary | ICD-10-CM | POA: Diagnosis not present

## 2022-07-08 DIAGNOSIS — M25562 Pain in left knee: Secondary | ICD-10-CM

## 2022-07-08 DIAGNOSIS — M6281 Muscle weakness (generalized): Secondary | ICD-10-CM | POA: Diagnosis not present

## 2022-07-08 DIAGNOSIS — R2689 Other abnormalities of gait and mobility: Secondary | ICD-10-CM

## 2022-07-08 DIAGNOSIS — Z96652 Presence of left artificial knee joint: Secondary | ICD-10-CM | POA: Diagnosis not present

## 2022-07-08 NOTE — Therapy (Signed)
OUTPATIENT PHYSICAL THERAPY LOWER EXTREMITY TREATMENT  Patient Name: Amber Mccall MRN: [id number] DOB:[birth year], 72 y.o., female Today's Date: 07/08/2022  END OF SESSION:  PT End of Session - 07/08/22 0757     Visit Number 2    Number of Visits 16    Date for PT Re-Evaluation 09/04/22    Authorization Type UHC medicare/medicaid    PT Start Time 0758    PT Stop Time 0851    PT Time Calculation (min) 53 min    Activity Tolerance Patient tolerated treatment well    Behavior During Therapy [facility] for tasks assessed/performed             Past Medical History:  Diagnosis Date   Anginal pain ([place])    pt says she feels like this may be related to acid reflux   Closed fracture of metatarsal bone(s) 01/30/2013   Depression    Essential hypertension    GERD (gastroesophageal reflux disease)    Lumbar radiculopathy    Osteoarthritis    Osteoporosis    Past Surgical History:  Procedure Laterality Date   ABDOMINAL HYSTERECTOMY     APPENDECTOMY     CATARACT EXTRACTION Bilateral 2022   [place] Osteotomy w/ Bone Graft Left 04/16/2015   Left foot   IR RADIOLOGIST EVAL & MGMT  04/11/2017   OPEN REDUCTION INTERNAL FIXATION (ORIF) DISTAL PHALANX Left 10/23/2014   5th Metatarsal   TOTAL KNEE ARTHROPLASTY Right 10/18/2021   Procedure: RIGHT TOTAL KNEE ARTHROPLASTY;  Surgeon: Leandrew Koyanagi, MD;  Location: [place];  Service: Orthopedics;  Laterality: Right;   TOTAL KNEE ARTHROPLASTY Left 06/20/2022   Procedure: LEFT TOTAL KNEE REPLACEMENT;  Surgeon: Leandrew Koyanagi, MD;  Location: [place];  Service: Orthopedics;  Laterality: Left;   Patient Active Problem List   Diagnosis Date Noted   Status post total left knee replacement 06/20/2022   Status post total right knee replacement 10/18/2021   Primary osteoarthritis of right knee 10/17/2021   Spondylosis without myelopathy or radiculopathy, lumbar region 09/28/2021   Chronic right shoulder pain 08/18/2021   Adhesive capsulitis of right shoulder  08/18/2021   Gastroesophageal reflux disease 08/18/2021   Primary osteoarthritis of left knee 07/27/2021   Primary osteoarthritis involving multiple joints 07/13/2021   Strain of right trapezius muscle 07/13/2021   Essential hypertension 05/19/2021   Hypertriglyceridemia 05/19/2021   Osteoporosis 05/19/2021   Lumbar radiculopathy 05/19/2021   Osteoarthritis 05/19/2021   Depression, major, single episode, in partial remission ([place]) 05/19/2021   Chronic right-sided low back pain 05/19/2021   Status post left foot surgery 04/23/2015   Injury of foot, left 04/08/2015   Sprain of ankle 04/08/2015   Deformity of metatarsal bone of left foot 04/08/2015   Metatarsalgia of left foot 02/10/2015   Tenosynovitis of foot 02/10/2015   Chronic pain of both knees 02/10/2015   Closed fracture of metatarsal bone 10/21/2014   Edema of left foot 10/21/2014   Closed fracture of metatarsal bone(s) 01/30/2013   Pain, foot 01/30/2013   Abnormal foot finding 01/30/2013    PCP: Riki Sheer DO REFERRING PROVIDER: Frankey Shown REFERRING DIAG: L TKR  THERAPY DIAG:  Muscle weakness (generalized)  Acute pain of left knee  Other abnormalities of gait and mobility  Localized edema  Rationale for Evaluation and Treatment: Rehabilitation  ONSET DATE: 06/20/22 L TKR  SUBJECTIVE:   SUBJECTIVE STATEMENT: Patient reports 8/10 pain in L knee today,states the swelling has decreased some since last visit. Patient states incision site is  better, no longer has issues with bleeding. Interpretor present during PT session to assist with language translation. Patient is compliant with HEP. Patient has follow up appt with ortho in beginning of April.   PERTINENT HISTORY: HTN, 2016 L foot surgery, prior R TKR, 2x back surgery, and appendectomy, and hysterectomy, and cateract surgery.  PAIN:  Are you having pain? Yes: NPRS scale: 8/10 Pain location: L knee Pain description: surgical pain, acute  pain Aggravating factors: always there Relieving factors: rest, ice  PRECAUTIONS: None  WEIGHT BEARING RESTRICTIONS:  WBAT  FALLS:  Has patient fallen in last 6 months? No  LIVING ENVIRONMENT: Lives with: lives with their family Lives in: House/apartment Stairs: Yes: External: 3 steps; one Has following equipment at home: [place] - 4 wheeled  OCCUPATION: retired  PLOF: Independent  PATIENT GOALS: no specific goal. Relieve the pain and return to daily life.   NEXT MD VISIT: unsure  OBJECTIVE:   PATIENT SURVEYS:  N/a due to interpreter services  SENSATION: WFL  EDEMA:  Circumferential: 39cm on L and 37 on R Bruising along lateral proximal thigh Incision viewed with no drainage noted  POSTURE:  maintains L knee flexion in standing  PALPATION: Tender to palpation in lateral quad and along knee  LOWER EXTREMITY ROM:  Active ROM Right eval Left eval  Knee flexion 115 98  Knee extension 0 10 Long arc quad extensor lag   LOWER EXTREMITY MMT:  MMT Right eval Left eval  Knee flexion  3/5  Knee extension  3/5  Ankle dorsiflexion    Ankle plantarflexion    Ankle inversion    Ankle eversion     (Blank rows = not tested)  FUNCTIONAL TESTS:  5 times sit to stand: 24.08 seconds 10 meter walk test: 1.96 ft/sec gait speed  GAIT: Distance walked: 100 ft Assistive device utilized: Environmental consultant - 4 wheeled Level of assistance: Modified independence Comments: dec'd L knee extension and dec'd heel strike Stairs mod indep with 2 rails step to pattern  TODAY'S TREATMENT:   [facility] Adult PT Treatment:                                                DATE: 07/08/2022 Therapeutic Exercise: Recumbent bike partial revolutions x 3 min Mat Table: Quad set 10x5" SAQ (black bolster) 10x5" AAROM heel slides w/strap  Small ROM SLR 10x3" LAQ 10x3" Seated HS stretch 2x30" Standing: Gastroc stretch (runner's lunge) Lateral weight shifting --> stride stance weight  shifting Fwd/Bkwd stepping over noodle Modalities: Game Ready, 34 degrees, low compression x 10 min                                                                                                                                DATE: 07/06/22  See HEP below  PATIENT EDUCATION:  Education details: HEP Person educated: Patient Education method: Explanation, Demonstration, and Handouts Education comprehension: verbalized understanding and returned demonstration  HOME EXERCISE PROGRAM: Access Code: [email or web address] URL: [email or web address] Date: 07/08/2022 Prepared by: Helane Gunther  Exercises - Quad Setting and Stretching  - 2 x daily - 7 x weekly - 1 sets - 10 reps - Supine Heel Slide  - 2 x daily - 7 x weekly - 1 sets - 10 reps - Supine Active Straight Leg Raise  - 2 x daily - 7 x weekly - 1 sets - 10 reps - Seated Knee Flexion Extension AROM   - 2 x daily - 7 x weekly - 1 sets - 10 reps - Seated Long Arc Quad  - 2 x daily - 7 x weekly - 1 sets - 10 reps - Standing Weight Shift Side to Side  - 1 x daily - 7 x weekly - 3 sets - 10 reps  ASSESSMENT:  CLINICAL IMPRESSION: Quad strengthening continued with addition of SAQ and small range straight leg raises. Patient had increased nausea after performing supine exercises and transitioning to sitting; symptoms passed with brief rest break and water. Discussion with patient to monitor symptoms during exercises and modify intensity and repetitions as needed. Mild increase in nausea again after weight shifting activities in standing.   OBJECTIVE IMPAIRMENTS: Abnormal gait, decreased activity tolerance, decreased balance, decreased mobility, difficulty walking, decreased ROM, decreased strength, increased edema, impaired flexibility, and pain.   ACTIVITY LIMITATIONS: bending, squatting, stairs, and locomotion level  PARTICIPATION LIMITATIONS: community activity  PERSONAL FACTORS: 1 comorbidity: HTN  are also affecting patient's  functional outcome.   REHAB POTENTIAL: Good  CLINICAL DECISION MAKING: Stable/uncomplicated  EVALUATION COMPLEXITY: Low   GOALS: Goals reviewed with patient? Yes   SHORT TERM GOALS: Target date: 08/03/2022    Independent with initial HEP. Baseline: initiated today Goal status: INITIAL  2.  Reduce pain to < or equal to 4/10. Baseline: 8/10 Goal status: INITIAL  LONG TERM GOALS: Target date: 08/31/2022   Independent with advanced/ongoing HEP to improve outcomes and carryover.  Baseline: initiated HEP today Goal status: INITIAL  2.  Patient will demonstrate Left knee flexion to 115 deg to ascend/descend stairs. Baseline: 98 Goal status: INITIAL  3.  Patient will demonstrate full left knee extension for safety with gait. Baseline: 10 degrees Goal status: INITIAL  4.  Patient will be able to ambulate 400' safely with LRAD and normal gait pattern to access community.  Baseline: 100 ft Goal status: INITIAL  5.  Patient will be able to ascend/descend stairs with 1 HR and reciprocal step pattern safely to access home and community.  Baseline: 2 rails with step to pattern Goal status: INITIAL  6.  Patient will report no pain L knee. Baseline: 8/10 Goal status: INITIAL  PLAN:  PT FREQUENCY: 2x/week  PT DURATION: 8 weeks  PLANNED INTERVENTIONS: Therapeutic exercises, Therapeutic activity, Neuromuscular re-education, Balance training, Gait training, Patient/Family education, Self Care, Joint mobilization, Stair training, DME instructions, Dry Needling, Electrical stimulation, Cryotherapy, Vasopneumatic device, and Manual therapy  PLAN FOR NEXT SESSION: Progress HEP, add more standing there ex, LE ROM and strengthening, gait training to normalize pattern.   Hardin Negus, PTA 07/08/2022, 8:45 AM

## 2022-07-11 ENCOUNTER — Ambulatory Visit: Payer: 59

## 2022-07-11 DIAGNOSIS — Z96652 Presence of left artificial knee joint: Secondary | ICD-10-CM | POA: Diagnosis not present

## 2022-07-11 DIAGNOSIS — R2689 Other abnormalities of gait and mobility: Secondary | ICD-10-CM

## 2022-07-11 DIAGNOSIS — M25562 Pain in left knee: Secondary | ICD-10-CM | POA: Diagnosis not present

## 2022-07-11 DIAGNOSIS — R600 Localized edema: Secondary | ICD-10-CM | POA: Diagnosis not present

## 2022-07-11 DIAGNOSIS — M6281 Muscle weakness (generalized): Secondary | ICD-10-CM | POA: Diagnosis not present

## 2022-07-11 NOTE — Therapy (Signed)
OUTPATIENT PHYSICAL THERAPY LOWER EXTREMITY TREATMENT  Patient Name: Amber Mccall MRN: [id number] DOB:March 30, 1951, 72 y.o., female Today's Date: 07/11/2022  END OF SESSION:  PT End of Session - 07/11/22 0758     Visit Number 3    Number of Visits 16    Date for PT Re-Evaluation 09/04/22    Authorization Type UHC medicare/medicaid    PT Start Time 0800    PT Stop Time 0850    PT Time Calculation (min) 50 min    Activity Tolerance Patient tolerated treatment well    Behavior During Therapy [facility] for tasks assessed/performed             Past Medical History:  Diagnosis Date   Anginal pain ([place])    pt says she feels like this may be related to acid reflux   Closed fracture of metatarsal bone(s) 01/30/2013   Depression    Essential hypertension    GERD (gastroesophageal reflux disease)    Lumbar radiculopathy    Osteoarthritis    Osteoporosis    Past Surgical History:  Procedure Laterality Date   ABDOMINAL HYSTERECTOMY     APPENDECTOMY     CATARACT EXTRACTION Bilateral 2022   [place] Osteotomy w/ Bone Graft Left 04/16/2015   Left foot   IR RADIOLOGIST EVAL & MGMT  04/11/2017   OPEN REDUCTION INTERNAL FIXATION (ORIF) DISTAL PHALANX Left 10/23/2014   5th Metatarsal   TOTAL KNEE ARTHROPLASTY Right 10/18/2021   Procedure: RIGHT TOTAL KNEE ARTHROPLASTY;  Surgeon: Leandrew Koyanagi, MD;  Location: [place];  Service: Orthopedics;  Laterality: Right;   TOTAL KNEE ARTHROPLASTY Left 06/20/2022   Procedure: LEFT TOTAL KNEE REPLACEMENT;  Surgeon: Leandrew Koyanagi, MD;  Location: [place];  Service: Orthopedics;  Laterality: Left;   Patient Active Problem List   Diagnosis Date Noted   Status post total left knee replacement 06/20/2022   Status post total right knee replacement 10/18/2021   Primary osteoarthritis of right knee 10/17/2021   Spondylosis without myelopathy or radiculopathy, lumbar region 09/28/2021   Chronic right shoulder pain 08/18/2021   Adhesive capsulitis of right shoulder  08/18/2021   Gastroesophageal reflux disease 08/18/2021   Primary osteoarthritis of left knee 07/27/2021   Primary osteoarthritis involving multiple joints 07/13/2021   Strain of right trapezius muscle 07/13/2021   Essential hypertension 05/19/2021   Hypertriglyceridemia 05/19/2021   Osteoporosis 05/19/2021   Lumbar radiculopathy 05/19/2021   Osteoarthritis 05/19/2021   Depression, major, single episode, in partial remission ([place]) 05/19/2021   Chronic right-sided low back pain 05/19/2021   Status post left foot surgery 04/23/2015   Injury of foot, left 04/08/2015   Sprain of ankle 04/08/2015   Deformity of metatarsal bone of left foot 04/08/2015   Metatarsalgia of left foot 02/10/2015   Tenosynovitis of foot 02/10/2015   Chronic pain of both knees 02/10/2015   Closed fracture of metatarsal bone 10/21/2014   Edema of left foot 10/21/2014   Closed fracture of metatarsal bone(s) 01/30/2013   Pain, foot 01/30/2013   Abnormal foot finding 01/30/2013    PCP: Riki Sheer DO REFERRING PROVIDER: Frankey Shown REFERRING DIAG: L TKR  THERAPY DIAG:  Muscle weakness (generalized)  Acute pain of left knee  Other abnormalities of gait and mobility  Localized edema  Rationale for Evaluation and Treatment: Rehabilitation  ONSET DATE: 06/20/22 L TKR  SUBJECTIVE:   SUBJECTIVE STATEMENT: Patient reports 6/10 pain today, states she continues to have nausea with exertion. Patient is compliant with HEP. Patient is agreeable  to participate in therapy today. Interpreter present via [employer].   PERTINENT HISTORY: HTN, 2016 L foot surgery, prior R TKR, 2x back surgery, and appendectomy, and hysterectomy, and cateract surgery.  PAIN:  Are you having pain? Yes: NPRS scale: 8/10 Pain location: L knee Pain description: surgical pain, acute pain Aggravating factors: always there Relieving factors: rest, ice  PRECAUTIONS: None  WEIGHT BEARING RESTRICTIONS:  WBAT  FALLS:  Has patient  fallen in last 6 months? No  LIVING ENVIRONMENT: Lives with: lives with their family Lives in: House/apartment Stairs: Yes: External: 3 steps; one Has following equipment at home: [place] - 4 wheeled  OCCUPATION: retired  PLOF: Independent  PATIENT GOALS: no specific goal. Relieve the pain and return to daily life.   NEXT MD VISIT: Beginning April   OBJECTIVE:   PATIENT SURVEYS:  N/a due to interpreter services  SENSATION: WFL  EDEMA:  Circumferential: 39cm on L and 37 on R Bruising along lateral proximal thigh Incision viewed with no drainage noted  POSTURE:  maintains L knee flexion in standing  PALPATION: Tender to palpation in lateral quad and along knee  LOWER EXTREMITY ROM:  Active ROM Right eval Left eval  Knee flexion 115 98  Knee extension 0 10 Long arc quad extensor lag   LOWER EXTREMITY MMT:  MMT Right eval Left eval  Knee flexion  3/5  Knee extension  3/5  Ankle dorsiflexion    Ankle plantarflexion    Ankle inversion    Ankle eversion     (Blank rows = not tested)  FUNCTIONAL TESTS:  5 times sit to stand: 24.08 seconds 10 meter walk test: 1.96 ft/sec gait speed  GAIT: Distance walked: 100 ft Assistive device utilized: Environmental consultant - 4 wheeled Level of assistance: Modified independence Comments: dec'd L knee extension and dec'd heel strike Stairs mod indep with 2 rails step to pattern  TODAY'S TREATMENT:   [facility] Adult PT Treatment:                                                DATE: 07/11/2022 Therapeutic Exercise: Recumbent bike partial revolutions x 5 min  Mat Table: AAROM heel slides w/strap 10x10" Quad set 10x5" SAQ (black bolster) 10x5" Small ROM SLR 10x3" Seated HS stretch w/strap 2x30" Standing: Gastroc stretch (runner's lunge) Fwd/Bkwd stepping over noodle --> flat yoga block --> high yoga block Toe tap on 4" step --> step up/down Stair navigation: step together --> reciprocal stepping pattern  Modalities: Game Ready, 34  degrees, low compression x 10 min    [facility] Adult PT Treatment:                                                DATE: 07/08/2022 Therapeutic Exercise: Recumbent bike partial revolutions x 3 min Mat Table: Quad set 10x5" SAQ (black bolster) 10x5" AAROM heel slides w/strap  Small ROM SLR 10x3" LAQ 10x3" Seated HS stretch 2x30" Standing: Gastroc stretch (runner's lunge) Lateral weight shifting --> stride stance weight shifting Fwd/Bkwd stepping over noodle Modalities: Game Ready, 34 degrees, low compression x 10 min  PATIENT EDUCATION:  Education details: HEP Person educated: Patient Education method: Consulting civil engineer, Media planner, and Handouts Education comprehension: verbalized understanding and returned demonstration  HOME EXERCISE PROGRAM: Access Code: [email or web address] URL: [email or web address] Date: 07/08/2022 Prepared by: Helane Gunther  Exercises - Quad Setting and Stretching  - 2 x daily - 7 x weekly - 1 sets - 10 reps - Supine Heel Slide  - 2 x daily - 7 x weekly - 1 sets - 10 reps - Supine Active Straight Leg Raise  - 2 x daily - 7 x weekly - 1 sets - 10 reps - Seated Knee Flexion Extension AROM   - 2 x daily - 7 x weekly - 1 sets - 10 reps - Seated Long Arc Quad  - 2 x daily - 7 x weekly - 1 sets - 10 reps - Standing Weight Shift Side to Side  - 1 x daily - 7 x weekly - 3 sets - 10 reps  ASSESSMENT:  CLINICAL IMPRESSION: Noted increased foot inversion during straight leg raises; tactile cues improved alignment however patient had difficulty maintaining form. Stair navigation progressed to reciprocal stepping pattern; mild increase in knee pain reported by patient after activity.    OBJECTIVE IMPAIRMENTS: Abnormal gait, decreased activity tolerance, decreased balance, decreased mobility, difficulty walking, decreased ROM, decreased strength,  increased edema, impaired flexibility, and pain.   ACTIVITY LIMITATIONS: bending, squatting, stairs, and locomotion level  PARTICIPATION LIMITATIONS: community activity  PERSONAL FACTORS: 1 comorbidity: HTN  are also affecting patient's functional outcome.   REHAB POTENTIAL: Good  CLINICAL DECISION MAKING: Stable/uncomplicated  EVALUATION COMPLEXITY: Low   GOALS: Goals reviewed with patient? Yes   SHORT TERM GOALS: Target date: 08/03/2022    Independent with initial HEP. Baseline: initiated today Goal status: INITIAL  2.  Reduce pain to < or equal to 4/10. Baseline: 8/10 Goal status: INITIAL  LONG TERM GOALS: Target date: 08/31/2022   Independent with advanced/ongoing HEP to improve outcomes and carryover.  Baseline: initiated HEP today Goal status: INITIAL  2.  Patient will demonstrate Left knee flexion to 115 deg to ascend/descend stairs. Baseline: 98 Goal status: INITIAL  3.  Patient will demonstrate full left knee extension for safety with gait. Baseline: 10 degrees Goal status: INITIAL  4.  Patient will be able to ambulate 400' safely with LRAD and normal gait pattern to access community.  Baseline: 100 ft Goal status: INITIAL  5.  Patient will be able to ascend/descend stairs with 1 HR and reciprocal step pattern safely to access home and community.  Baseline: 2 rails with step to pattern Goal status: INITIAL  6.  Patient will report no pain L knee. Baseline: 8/10 Goal status: INITIAL  PLAN:  PT FREQUENCY: 2x/week  PT DURATION: 8 weeks  PLANNED INTERVENTIONS: Therapeutic exercises, Therapeutic activity, Neuromuscular re-education, Balance training, Gait training, Patient/Family education, Self Care, Joint mobilization, Stair training, DME instructions, Dry Needling, Electrical stimulation, Cryotherapy, Vasopneumatic device, and Manual therapy  PLAN FOR NEXT SESSION: Progress HEP, add more standing there ex, LE ROM and strengthening, gait training to  normalize pattern.   Hardin Negus, PTA 07/11/2022, 8:45 AM

## 2022-07-14 ENCOUNTER — Ambulatory Visit: Payer: 59

## 2022-07-14 DIAGNOSIS — Z96652 Presence of left artificial knee joint: Secondary | ICD-10-CM | POA: Diagnosis not present

## 2022-07-14 DIAGNOSIS — R600 Localized edema: Secondary | ICD-10-CM | POA: Diagnosis not present

## 2022-07-14 DIAGNOSIS — M6281 Muscle weakness (generalized): Secondary | ICD-10-CM | POA: Diagnosis not present

## 2022-07-14 DIAGNOSIS — R2689 Other abnormalities of gait and mobility: Secondary | ICD-10-CM | POA: Diagnosis not present

## 2022-07-14 DIAGNOSIS — M25562 Pain in left knee: Secondary | ICD-10-CM

## 2022-07-14 NOTE — Therapy (Signed)
OUTPATIENT PHYSICAL THERAPY LOWER EXTREMITY TREATMENT  Patient Name: Amber Mccall MRN: [id number] DOB:April 25, 1951, 72 y.o., female Today's Date: 07/14/2022  END OF SESSION:  PT End of Session - 07/14/22 1012     Visit Number 4    Number of Visits 16    Date for PT Re-Evaluation 09/04/22    Authorization Type UHC medicare/medicaid    PT Start Time [id number]    PT Stop Time 1110    PT Time Calculation (min) 55 min    Activity Tolerance Patient tolerated treatment well    Behavior During Therapy WFL for tasks assessed/performed             Past Medical History:  Diagnosis Date   Anginal pain ([place])    pt says she feels like this may be related to acid reflux   Closed fracture of metatarsal bone(s) 01/30/2013   Depression    Essential hypertension    GERD (gastroesophageal reflux disease)    Lumbar radiculopathy    Osteoarthritis    Osteoporosis    Past Surgical History:  Procedure Laterality Date   ABDOMINAL HYSTERECTOMY     APPENDECTOMY     CATARACT EXTRACTION Bilateral 2022   [place] Osteotomy w/ Bone Graft Left 04/16/2015   Left foot   IR RADIOLOGIST EVAL & MGMT  04/11/2017   OPEN REDUCTION INTERNAL FIXATION (ORIF) DISTAL PHALANX Left 10/23/2014   5th Metatarsal   TOTAL KNEE ARTHROPLASTY Right 10/18/2021   Procedure: RIGHT TOTAL KNEE ARTHROPLASTY;  Surgeon: Leandrew Koyanagi, MD;  Location: [place];  Service: Orthopedics;  Laterality: Right;   TOTAL KNEE ARTHROPLASTY Left 06/20/2022   Procedure: LEFT TOTAL KNEE REPLACEMENT;  Surgeon: Leandrew Koyanagi, MD;  Location: [place];  Service: Orthopedics;  Laterality: Left;   Patient Active Problem List   Diagnosis Date Noted   Status post total left knee replacement 06/20/2022   Status post total right knee replacement 10/18/2021   Primary osteoarthritis of right knee 10/17/2021   Spondylosis without myelopathy or radiculopathy, lumbar region 09/28/2021   Chronic right shoulder pain 08/18/2021   Adhesive capsulitis of right shoulder  08/18/2021   Gastroesophageal reflux disease 08/18/2021   Primary osteoarthritis of left knee 07/27/2021   Primary osteoarthritis involving multiple joints 07/13/2021   Strain of right trapezius muscle 07/13/2021   Essential hypertension 05/19/2021   Hypertriglyceridemia 05/19/2021   Osteoporosis 05/19/2021   Lumbar radiculopathy 05/19/2021   Osteoarthritis 05/19/2021   Depression, major, single episode, in partial remission ([place]) 05/19/2021   Chronic right-sided low back pain 05/19/2021   Status post left foot surgery 04/23/2015   Injury of foot, left 04/08/2015   Sprain of ankle 04/08/2015   Deformity of metatarsal bone of left foot 04/08/2015   Metatarsalgia of left foot 02/10/2015   Tenosynovitis of foot 02/10/2015   Chronic pain of both knees 02/10/2015   Closed fracture of metatarsal bone 10/21/2014   Edema of left foot 10/21/2014   Closed fracture of metatarsal bone(s) 01/30/2013   Pain, foot 01/30/2013   Abnormal foot finding 01/30/2013    PCP: Riki Sheer DO REFERRING PROVIDER: Frankey Shown REFERRING DIAG: L TKR  THERAPY DIAG:  Muscle weakness (generalized)  Other abnormalities of gait and mobility  Localized edema  Acute pain of left knee  Rationale for Evaluation and Treatment: Rehabilitation  ONSET DATE: 06/20/22 L TKR  SUBJECTIVE:   SUBJECTIVE STATEMENT: Patient reports 6/10 pain today, states she continues to have nausea with exertion. Patient is compliant with HEP, states she also  uses a stationary bike at home. Patient is agreeable to participate in therapy today.   [Interpreter present via [employer]]   PERTINENT HISTORY: HTN, 2016 L foot surgery, prior R TKR, 2x back surgery, and appendectomy, and hysterectomy, and cateract surgery.  PAIN:  Are you having pain? Yes: NPRS scale: 8/10 Pain location: L knee Pain description: surgical pain, acute pain Aggravating factors: always there Relieving factors: rest, ice  PRECAUTIONS: None  WEIGHT  BEARING RESTRICTIONS:  WBAT  FALLS:  Has patient fallen in last 6 months? No  LIVING ENVIRONMENT: Lives with: lives with their family Lives in: House/apartment Stairs: Yes: External: 3 steps; one Has following equipment at home: [place] - 4 wheeled  OCCUPATION: retired  PLOF: Independent  PATIENT GOALS: no specific goal. Relieve the pain and return to daily life.   NEXT MD VISIT: Beginning April   OBJECTIVE:   PATIENT SURVEYS:  N/a due to interpreter services  SENSATION: WFL  EDEMA:  Circumferential: 39cm on L and 37 on R Bruising along lateral proximal thigh Incision viewed with no drainage noted  POSTURE:  maintains L knee flexion in standing  PALPATION: Tender to palpation in lateral quad and along knee  LOWER EXTREMITY ROM:  Active ROM Right eval Left eval Left 07/14/22  Knee flexion 115 98 120  Knee extension 0 10 Long arc quad extensor lag 10  Long arc quad extensor lag   LOWER EXTREMITY MMT:  MMT Right eval Left eval  Knee flexion  3/5  Knee extension  3/5  Ankle dorsiflexion    Ankle plantarflexion    Ankle inversion    Ankle eversion     (Blank rows = not tested)  FUNCTIONAL TESTS:  5 times sit to stand: 24.08 seconds 10 meter walk test: 1.96 ft/sec gait speed  GAIT: Distance walked: 100 ft Assistive device utilized: Environmental consultant - 4 wheeled Level of assistance: Modified independence Comments: dec'd L knee extension and dec'd heel strike Stairs mod indep with 2 rails step to pattern  TODAY'S TREATMENT:   [facility] Adult PT Treatment:                                                DATE: 07/14/2022 Therapeutic Exercise: Recumbent bike partial revolutions + subjective intake Mat Table: AAROM heel slides w/strap 10x10" HS stretch w/strap 3x30" Small ROM SLR 8x3" S/L clamshell x10 S/L straight leg hip abd x10 Bridges x5 Supine ITB stretch w/strap LAQ 10x3" STS x10 (low steppe under feet) Standing: Hip abd x10  Hip extension x10 HS curl  x10  Modalities: Game Ready, 34 degrees, low compression x 10 min    [facility] Adult PT Treatment:                                                DATE: 07/11/2022 Therapeutic Exercise: Recumbent bike partial revolutions x 5 min  Mat Table: AAROM heel slides w/strap 10x10" Quad set 10x5" SAQ (black bolster) 10x5" Small ROM SLR 10x3" Seated HS stretch w/strap 2x30" Standing: Gastroc stretch (runner's lunge) Fwd/Bkwd stepping over noodle --> flat yoga block --> high yoga block Toe tap on 4" step --> step up/down Stair navigation: step together --> reciprocal stepping pattern  Modalities: Game Ready, 34 degrees, low  compression x 10 min                                                                                                                           PATIENT EDUCATION:  Education details: Self-monitoring exertion and mobility within comfortable range to decrease over exertion tendencies. Person educated: Patient Education method: Explanation, Demonstration, and Handouts Education comprehension: verbalized understanding and returned demonstration  HOME EXERCISE PROGRAM: Access Code: [email or web address] URL: [email or web address] Date: 07/08/2022 Prepared by: Helane Gunther  Exercises - Quad Setting and Stretching  - 2 x daily - 7 x weekly - 1 sets - 10 reps - Supine Heel Slide  - 2 x daily - 7 x weekly - 1 sets - 10 reps - Supine Active Straight Leg Raise  - 2 x daily - 7 x weekly - 1 sets - 10 reps - Seated Knee Flexion Extension AROM   - 2 x daily - 7 x weekly - 1 sets - 10 reps - Seated Long Arc Quad  - 2 x daily - 7 x weekly - 1 sets - 10 reps - Standing Weight Shift Side to Side  - 1 x daily - 7 x weekly - 3 sets - 10 reps  ASSESSMENT:  CLINICAL IMPRESSION: Patient demonstrated good progression in knee flexion AROM measurement since initial evaluation (see above); no significant change in knee extension AROM. Verbal cues provided to increase L LE weight bearing  during sit to stand exercise. Hamstring curls added along with hip abd and extension in standing to progress functional LE strength. Patient continues to have bouts of increased nausea with exertion; discussion with patient to monitor exertion and mobility within comfortable range to decrease over exertion tendencies.   OBJECTIVE IMPAIRMENTS: Abnormal gait, decreased activity tolerance, decreased balance, decreased mobility, difficulty walking, decreased ROM, decreased strength, increased edema, impaired flexibility, and pain.   ACTIVITY LIMITATIONS: bending, squatting, stairs, and locomotion level  PARTICIPATION LIMITATIONS: community activity  PERSONAL FACTORS: 1 comorbidity: HTN  are also affecting patient's functional outcome.   REHAB POTENTIAL: Good  CLINICAL DECISION MAKING: Stable/uncomplicated  EVALUATION COMPLEXITY: Low   GOALS: Goals reviewed with patient? Yes   SHORT TERM GOALS: Target date: 08/03/2022    Independent with initial HEP. Baseline: initiated today Goal status: INITIAL  2.  Reduce pain to < or equal to 4/10. Baseline: 8/10 Goal status: INITIAL  LONG TERM GOALS: Target date: 08/31/2022   Independent with advanced/ongoing HEP to improve outcomes and carryover.  Baseline: initiated HEP today Goal status: INITIAL  2.  Patient will demonstrate Left knee flexion to 115 deg to ascend/descend stairs. Baseline: 98 Goal status: INITIAL  3.  Patient will demonstrate full left knee extension for safety with gait. Baseline: 10 degrees Goal status: INITIAL  4.  Patient will be able to ambulate 400' safely with LRAD and normal gait pattern to access community.  Baseline: 100 ft Goal status: INITIAL  5.  Patient will be able to ascend/descend stairs with 1  HR and reciprocal step pattern safely to access home and community.  Baseline: 2 rails with step to pattern Goal status: INITIAL  6.  Patient will report no pain L knee. Baseline: 8/10 Goal status:  INITIAL  PLAN:  PT FREQUENCY: 2x/week  PT DURATION: 8 weeks  PLANNED INTERVENTIONS: Therapeutic exercises, Therapeutic activity, Neuromuscular re-education, Balance training, Gait training, Patient/Family education, Self Care, Joint mobilization, Stair training, DME instructions, Dry Needling, Electrical stimulation, Cryotherapy, Vasopneumatic device, and Manual therapy  PLAN FOR NEXT SESSION: Update HEP, add more standing there ex, LE ROM and strengthening, gait training to normalize pattern.   Hardin Negus, PTA 07/14/2022, 12:38 PM

## 2022-07-18 ENCOUNTER — Ambulatory Visit: Payer: 59

## 2022-07-18 DIAGNOSIS — M6281 Muscle weakness (generalized): Secondary | ICD-10-CM | POA: Diagnosis not present

## 2022-07-18 DIAGNOSIS — M25562 Pain in left knee: Secondary | ICD-10-CM

## 2022-07-18 DIAGNOSIS — Z96652 Presence of left artificial knee joint: Secondary | ICD-10-CM | POA: Diagnosis not present

## 2022-07-18 DIAGNOSIS — R2689 Other abnormalities of gait and mobility: Secondary | ICD-10-CM | POA: Diagnosis not present

## 2022-07-18 DIAGNOSIS — R600 Localized edema: Secondary | ICD-10-CM | POA: Diagnosis not present

## 2022-07-18 DIAGNOSIS — H02403 Unspecified ptosis of bilateral eyelids: Secondary | ICD-10-CM | POA: Diagnosis not present

## 2022-07-18 NOTE — Therapy (Signed)
OUTPATIENT PHYSICAL THERAPY LOWER EXTREMITY TREATMENT  Patient Name: Amber Mccall MRN: [id number] DOB:May 07, 1950, 72 y.o., female Today's Date: 07/18/2022  END OF SESSION:  PT End of Session - 07/18/22 0851     Visit Number 5    Number of Visits 16    Date for PT Re-Evaluation 09/04/22    Authorization Type UHC medicare/medicaid    PT Start Time 0848    PT Stop Time 0942    PT Time Calculation (min) 54 min    Activity Tolerance Patient tolerated treatment well    Behavior During Therapy [facility] for tasks assessed/performed             Past Medical History:  Diagnosis Date   Anginal pain ([place])    pt says she feels like this may be related to acid reflux   Closed fracture of metatarsal bone(s) 01/30/2013   Depression    Essential hypertension    GERD (gastroesophageal reflux disease)    Lumbar radiculopathy    Osteoarthritis    Osteoporosis    Past Surgical History:  Procedure Laterality Date   ABDOMINAL HYSTERECTOMY     APPENDECTOMY     CATARACT EXTRACTION Bilateral 2022   [place]ton Osteotomy w/ Bone Graft Left 04/16/2015   Left foot   IR RADIOLOGIST EVAL & MGMT  04/11/2017   OPEN REDUCTION INTERNAL FIXATION (ORIF) DISTAL PHALANX Left 10/23/2014   5th Metatarsal   TOTAL KNEE ARTHROPLASTY Right 10/18/2021   Procedure: RIGHT TOTAL KNEE ARTHROPLASTY;  Surgeon: Leandrew Koyanagi, MD;  Location: [place];  Service: Orthopedics;  Laterality: Right;   TOTAL KNEE ARTHROPLASTY Left 06/20/2022   Procedure: LEFT TOTAL KNEE REPLACEMENT;  Surgeon: Leandrew Koyanagi, MD;  Location: [place];  Service: Orthopedics;  Laterality: Left;   Patient Active Problem List   Diagnosis Date Noted   Status post total left knee replacement 06/20/2022   Status post total right knee replacement 10/18/2021   Primary osteoarthritis of right knee 10/17/2021   Spondylosis without myelopathy or radiculopathy, lumbar region 09/28/2021   Chronic right shoulder pain 08/18/2021   Adhesive capsulitis of right shoulder  08/18/2021   Gastroesophageal reflux disease 08/18/2021   Primary osteoarthritis of left knee 07/27/2021   Primary osteoarthritis involving multiple joints 07/13/2021   Strain of right trapezius muscle 07/13/2021   Essential hypertension 05/19/2021   Hypertriglyceridemia 05/19/2021   Osteoporosis 05/19/2021   Lumbar radiculopathy 05/19/2021   Osteoarthritis 05/19/2021   Depression, major, single episode, in partial remission ([place]) 05/19/2021   Chronic right-sided low back pain 05/19/2021   Status post left foot surgery 04/23/2015   Injury of foot, left 04/08/2015   Sprain of ankle 04/08/2015   Deformity of metatarsal bone of left foot 04/08/2015   Metatarsalgia of left foot 02/10/2015   Tenosynovitis of foot 02/10/2015   Chronic pain of both knees 02/10/2015   Closed fracture of metatarsal bone 10/21/2014   Edema of left foot 10/21/2014   Closed fracture of metatarsal bone(s) 01/30/2013   Pain, foot 01/30/2013   Abnormal foot finding 01/30/2013    PCP: Riki Sheer DO REFERRING PROVIDER: Frankey Shown REFERRING DIAG: L TKR  THERAPY DIAG:  Muscle weakness (generalized)  Other abnormalities of gait and mobility  Localized edema  Acute pain of left knee  Rationale for Evaluation and Treatment: Rehabilitation  ONSET DATE: 06/20/22 L TKR  SUBJECTIVE:   SUBJECTIVE STATEMENT: Patient report 6/10 pain today, states she always has her rollator near her when she is walking around the house.   [  Interpreter present via [employer]]   PERTINENT HISTORY: HTN, 2016 L foot surgery, prior R TKR, 2x back surgery, and appendectomy, and hysterectomy, and cateract surgery.  PAIN:  Are you having pain? Yes: NPRS scale: 8/10 Pain location: L knee Pain description: surgical pain, acute pain Aggravating factors: always there Relieving factors: rest, ice  PRECAUTIONS: None  WEIGHT BEARING RESTRICTIONS:  WBAT  FALLS:  Has patient fallen in last 6 months? No  LIVING  ENVIRONMENT: Lives with: lives with their family Lives in: House/apartment Stairs: Yes: External: 3 steps; one Has following equipment at home: [place] - 4 wheeled  OCCUPATION: retired  PLOF: Independent  PATIENT GOALS: no specific goal. Relieve the pain and return to daily life.   NEXT MD VISIT: Beginning April   OBJECTIVE:   PATIENT SURVEYS:  N/a due to interpreter services  SENSATION: WFL  EDEMA:  Circumferential: 39cm on L and 37 on R Bruising along lateral proximal thigh Incision viewed with no drainage noted  POSTURE:  maintains L knee flexion in standing  PALPATION: Tender to palpation in lateral quad and along knee  LOWER EXTREMITY ROM:  Active ROM Right eval Left eval Left 07/14/22  Knee flexion 115 98 120  Knee extension 0 10 Long arc quad extensor lag 10  Long arc quad extensor lag   LOWER EXTREMITY MMT:  MMT Right eval Left eval  Knee flexion  3/5  Knee extension  3/5  Ankle dorsiflexion    Ankle plantarflexion    Ankle inversion    Ankle eversion     (Blank rows = not tested)  FUNCTIONAL TESTS:  5 times sit to stand: 24.08 seconds 10 meter walk test: 1.96 ft/sec gait speed  GAIT: Distance walked: 100 ft Assistive device utilized: Environmental consultant - 4 wheeled Level of assistance: Modified independence Comments: dec'd L knee extension and dec'd heel strike Stairs mod indep with 2 rails step to pattern  TODAY'S TREATMENT:   [facility] Adult PT Treatment:                                                DATE: 07/18/2022 Therapeutic Exercise: Recumbent bike partial revolutions + subjective intake Quad set 5x5" AAROM heel slides with strap 10x10" Seated LAQ 3#AW 10x5" Standing: HS curls x10 Hip abd blue loop band x10 Hip extension light green lop band x10 TKE RTB (discontinued d/t confusion) Walking with focus on heel strike Manual Therapy: Patella mobs -> STM distal quad/ITB, proximal ant tib Modalities: Game Ready, 34 degrees, low compression x  10 min    [facility] Adult PT Treatment:                                                DATE: 07/14/2022 Therapeutic Exercise: Recumbent bike partial revolutions + subjective intake Mat Table: AAROM heel slides w/strap 10x10" HS stretch w/strap 3x30" Small ROM SLR 8x3" S/L clamshell x10 S/L straight leg hip abd x10 Bridges x5 Supine ITB stretch w/strap LAQ 10x3" STS x10 (low steppe under feet) Standing: Hip abd x10  Hip extension x10 HS curl x10  Modalities: Game Ready, 34 degrees, low compression x 10 min  PATIENT EDUCATION:  Education details: Self-monitoring exertion and mobility within comfortable range to decrease over exertion tendencies. Person educated: Patient Education method: Explanation, Demonstration, and Handouts Education comprehension: verbalized understanding and returned demonstration  HOME EXERCISE PROGRAM: Access Code: [email or web address] URL: [email or web address] Date: 07/08/2022 Prepared by: Helane Gunther  Exercises - Quad Setting and Stretching  - 2 x daily - 7 x weekly - 1 sets - 10 reps - Supine Heel Slide  - 2 x daily - 7 x weekly - 1 sets - 10 reps - Supine Active Straight Leg Raise  - 2 x daily - 7 x weekly - 1 sets - 10 reps - Seated Knee Flexion Extension AROM   - 2 x daily - 7 x weekly - 1 sets - 10 reps - Seated Long Arc Quad  - 2 x daily - 7 x weekly - 1 sets - 10 reps - Standing Weight Shift Side to Side  - 1 x daily - 7 x weekly - 3 sets - 10 reps  ASSESSMENT:  CLINICAL IMPRESSION: Cueing heel strike improved reciprocal gait pattern and ankle stability during ambulation. Standing resisted total knee extension attempted, however patient had difficulty understanding cues to improve body mechanics and form.    OBJECTIVE IMPAIRMENTS: Abnormal gait, decreased activity tolerance, decreased balance, decreased mobility,  difficulty walking, decreased ROM, decreased strength, increased edema, impaired flexibility, and pain.   ACTIVITY LIMITATIONS: bending, squatting, stairs, and locomotion level  PARTICIPATION LIMITATIONS: community activity  PERSONAL FACTORS: 1 comorbidity: HTN  are also affecting patient's functional outcome.   REHAB POTENTIAL: Good  CLINICAL DECISION MAKING: Stable/uncomplicated  EVALUATION COMPLEXITY: Low   GOALS: Goals reviewed with patient? Yes   SHORT TERM GOALS: Target date: 08/03/2022    Independent with initial HEP. Baseline: initiated today Goal status: INITIAL  2.  Reduce pain to < or equal to 4/10. Baseline: 8/10 Goal status: INITIAL  LONG TERM GOALS: Target date: 08/31/2022   Independent with advanced/ongoing HEP to improve outcomes and carryover.  Baseline: initiated HEP today Goal status: INITIAL  2.  Patient will demonstrate Left knee flexion to 115 deg to ascend/descend stairs. Baseline: 98 Goal status: INITIAL  3.  Patient will demonstrate full left knee extension for safety with gait. Baseline: 10 degrees Goal status: INITIAL  4.  Patient will be able to ambulate 400' safely with LRAD and normal gait pattern to access community.  Baseline: 100 ft Goal status: INITIAL  5.  Patient will be able to ascend/descend stairs with 1 HR and reciprocal step pattern safely to access home and community.  Baseline: 2 rails with step to pattern Goal status: INITIAL  6.  Patient will report no pain L knee. Baseline: 8/10 Goal status: INITIAL  PLAN:  PT FREQUENCY: 2x/week  PT DURATION: 8 weeks  PLANNED INTERVENTIONS: Therapeutic exercises, Therapeutic activity, Neuromuscular re-education, Balance training, Gait training, Patient/Family education, Self Care, Joint mobilization, Stair training, DME instructions, Dry Needling, Electrical stimulation, Cryotherapy, Vasopneumatic device, and Manual therapy  PLAN FOR NEXT SESSION: Update HEP, add more standing  there ex, LE ROM and strengthening, gait training to normalize pattern.   Hardin Negus, PTA 07/18/2022, 9:34 AM

## 2022-07-20 ENCOUNTER — Encounter: Payer: Self-pay | Admitting: Rehabilitative and Restorative Service Providers"

## 2022-07-20 ENCOUNTER — Ambulatory Visit: Payer: 59 | Admitting: Rehabilitative and Restorative Service Providers"

## 2022-07-20 DIAGNOSIS — M25562 Pain in left knee: Secondary | ICD-10-CM | POA: Diagnosis not present

## 2022-07-20 DIAGNOSIS — R2689 Other abnormalities of gait and mobility: Secondary | ICD-10-CM | POA: Diagnosis not present

## 2022-07-20 DIAGNOSIS — R600 Localized edema: Secondary | ICD-10-CM

## 2022-07-20 DIAGNOSIS — M6281 Muscle weakness (generalized): Secondary | ICD-10-CM

## 2022-07-20 DIAGNOSIS — Z96652 Presence of left artificial knee joint: Secondary | ICD-10-CM | POA: Diagnosis not present

## 2022-07-20 NOTE — Therapy (Signed)
OUTPATIENT PHYSICAL THERAPY LOWER EXTREMITY TREATMENT  Patient Name: Amber Mccall MRN: [id number] DOB:1951-04-05, 72 y.o., female Today's Date: 07/20/2022  END OF SESSION:  PT End of Session - 07/20/22 1023     Visit Number 6    Number of Visits 16    Date for PT Re-Evaluation 09/04/22    Authorization Type UHC medicare/medicaid    PT Start Time 1025    PT Stop Time 1105    PT Time Calculation (min) 40 min    Activity Tolerance Patient tolerated treatment well    Behavior During Therapy WFL for tasks assessed/performed             Past Medical History:  Diagnosis Date   Anginal pain ([place])    pt says she feels like this may be related to acid reflux   Closed fracture of metatarsal bone(s) 01/30/2013   Depression    Essential hypertension    GERD (gastroesophageal reflux disease)    Lumbar radiculopathy    Osteoarthritis    Osteoporosis    Past Surgical History:  Procedure Laterality Date   ABDOMINAL HYSTERECTOMY     APPENDECTOMY     CATARACT EXTRACTION Bilateral 2022   [place] Osteotomy w/ Bone Graft Left 04/16/2015   Left foot   IR RADIOLOGIST EVAL & MGMT  04/11/2017   OPEN REDUCTION INTERNAL FIXATION (ORIF) DISTAL PHALANX Left 10/23/2014   5th Metatarsal   TOTAL KNEE ARTHROPLASTY Right 10/18/2021   Procedure: RIGHT TOTAL KNEE ARTHROPLASTY;  Surgeon: Leandrew Koyanagi, MD;  Location: [place];  Service: Orthopedics;  Laterality: Right;   TOTAL KNEE ARTHROPLASTY Left 06/20/2022   Procedure: LEFT TOTAL KNEE REPLACEMENT;  Surgeon: Leandrew Koyanagi, MD;  Location: [place];  Service: Orthopedics;  Laterality: Left;   Patient Active Problem List   Diagnosis Date Noted   Status post total left knee replacement 06/20/2022   Status post total right knee replacement 10/18/2021   Primary osteoarthritis of right knee 10/17/2021   Spondylosis without myelopathy or radiculopathy, lumbar region 09/28/2021   Chronic right shoulder pain 08/18/2021   Adhesive capsulitis of right shoulder  08/18/2021   Gastroesophageal reflux disease 08/18/2021   Primary osteoarthritis of left knee 07/27/2021   Primary osteoarthritis involving multiple joints 07/13/2021   Strain of right trapezius muscle 07/13/2021   Essential hypertension 05/19/2021   Hypertriglyceridemia 05/19/2021   Osteoporosis 05/19/2021   Lumbar radiculopathy 05/19/2021   Osteoarthritis 05/19/2021   Depression, major, single episode, in partial remission ([place]) 05/19/2021   Chronic right-sided low back pain 05/19/2021   Status post left foot surgery 04/23/2015   Injury of foot, left 04/08/2015   Sprain of ankle 04/08/2015   Deformity of metatarsal bone of left foot 04/08/2015   Metatarsalgia of left foot 02/10/2015   Tenosynovitis of foot 02/10/2015   Chronic pain of both knees 02/10/2015   Closed fracture of metatarsal bone 10/21/2014   Edema of left foot 10/21/2014   Closed fracture of metatarsal bone(s) 01/30/2013   Pain, foot 01/30/2013   Abnormal foot finding 01/30/2013    PCP: Riki Sheer DO REFERRING PROVIDER: Frankey Shown REFERRING DIAG: L TKR  THERAPY DIAG:  Muscle weakness (generalized)  Other abnormalities of gait and mobility  Localized edema  Rationale for Evaluation and Treatment: Rehabilitation  ONSET DATE: 06/20/22 L TKR  SUBJECTIVE:  SUBJECTIVE STATEMENT: Patient report 4/10 pain today, states she always has her rollator near her when she is walking around the house.  [Interpreter present via [employer]] PERTINENT HISTORY: HTN,  2016 L foot surgery, prior R TKR, 2x back surgery, and appendectomy, and hysterectomy, and cateract surgery.  PAIN:  Are you having pain? Yes: NPRS scale: 4/10 Pain location: L knee Pain description: surgical pain, acute pain Aggravating factors: always there Relieving factors: rest, ice PRECAUTIONS: None WEIGHT BEARING RESTRICTIONS:  WBAT FALLS:  Has patient fallen in last 6 months? No PATIENT GOALS: no specific goal. Relieve the pain and return to  daily life.  NEXT MD VISIT: Beginning April   OBJECTIVE:  (Measures in this section from initial evaluation unless otherwise noted) PATIENT SURVEYS:  N/a due to interpreter services SENSATION: WFL EDEMA:  Circumferential: 39cm on L and 37 on R Bruising along lateral proximal thigh Incision viewed with no drainage noted POSTURE:  maintains L knee flexion in standing PALPATION: Tender to palpation in lateral quad and along knee LOWER EXTREMITY ROM:  Active ROM Right eval Left eval Left 07/14/22  Knee flexion 115 98 120  Knee extension 0 10 Long arc quad extensor lag 10  Long arc quad extensor lag  LOWER EXTREMITY MMT:  MMT Right eval Left eval  Knee flexion  3/5  Knee extension  3/5  Ankle dorsiflexion    Ankle plantarflexion    Ankle inversion    Ankle eversion     (Blank rows = not tested) FUNCTIONAL TESTS:  5 times sit to stand: 24.08 seconds 10 meter walk test: 1.96 ft/sec gait speed GAIT: Distance walked: 100 ft Assistive device utilized: Environmental consultant - 4 wheeled Level of assistance: Modified independence Comments: dec'd L knee extension and dec'd heel strike Stairs mod indep with 2 rails step to pattern  TODAY'S TREATMENT:   [facility] Adult PT Treatment:                                                DATE: 07/20/22 Therapeutic Exercise: Bike x 5 minutes partial revolutions while intake subjecitve Standing Mini squat x 12 reps Heel raises x 12 reps Step ups to 4" surface x 10 reps Step downs from 4" surface leading with R LE x 10 reps Supine Heel slides x 10 reps Bridges x 10 reps Quad sets x 10 reps Manual Therapy: Overpressure into extension Overpressure into flexion Gait: With SPC x 50 feet x 4 reps with cues on sequencing * recommending cane use in home and rollator outdoors Modalities: Game ready x 10 minutes L knee  [facility] Adult PT Treatment:                                                DATE: 07/18/2022 Therapeutic Exercise: Recumbent bike partial  revolutions + subjective intake Quad set 5x5" AAROM heel slides with strap 10x10" Seated LAQ 3#AW 10x5" Standing: HS curls x10 Hip abd blue loop band x10 Hip extension light green lop band x10 TKE RTB (discontinued d/t confusion) Walking with focus on heel strike Manual Therapy: Patella mobs -> STM distal quad/ITB, proximal ant tib Modalities: Game Ready, 34 degrees, low compression x 10 min  PATIENT EDUCATION:  Education details: Self-monitoring exertion and mobility within comfortable range to decrease over exertion tendencies. Person educated: Patient Education method: Explanation, Demonstration, and Handouts Education comprehension: verbalized understanding and returned demonstration  HOME EXERCISE PROGRAM: Access Code: [email or web address] URL: [email or web address] Date: 07/08/2022 Prepared by: Helane Gunther  Exercises - Quad Setting and Stretching  - 2 x daily - 7 x weekly - 1 sets - 10 reps - Supine Heel Slide  - 2 x daily - 7 x weekly - 1 sets - 10 reps - Supine Active Straight Leg Raise  - 2 x daily - 7 x weekly - 1 sets - 10 reps - Seated Knee Flexion Extension AROM   - 2 x daily - 7 x weekly - 1 sets - 10 reps - Seated Long Arc Quad  - 2 x daily - 7 x weekly - 1 sets - 10 reps - Standing Weight Shift Side to Side  - 1 x daily - 7 x weekly - 3 sets - 10 reps  ASSESSMENT:  CLINICAL IMPRESSION: The patient was encouraged to transition to [facility] in the home as she was safe with this device in clinic today and demonstrated proper use with repetition. She continues with dec'd terminal knee extension-- PT provided tactile, verbal, and demo cues to get L knee extension during step ups and rising from squats. Plan to continue working to STGs/LTGs.   OBJECTIVE IMPAIRMENTS: Abnormal gait, decreased activity tolerance, decreased balance, decreased mobility,  difficulty walking, decreased ROM, decreased strength, increased edema, impaired flexibility, and pain.   GOALS: Goals reviewed with patient? Yes   SHORT TERM GOALS: Target date: 08/03/2022    Independent with initial HEP. Baseline: initiated today Goal status: ACHIEVED 07/20/22  2.  Reduce pain to < or equal to 4/10. Baseline: 8/10 Goal status: ACHIEVED 07/20/22  LONG TERM GOALS: Target date: 08/31/2022   Independent with advanced/ongoing HEP to improve outcomes and carryover.  Baseline: initiated HEP today Goal status: INITIAL  2.  Patient will demonstrate Left knee flexion to 115 deg to ascend/descend stairs. Baseline: 98 Goal status: INITIAL  3.  Patient will demonstrate full left knee extension for safety with gait. Baseline: 10 degrees Goal status: INITIAL  4.  Patient will be able to ambulate 400' safely with LRAD and normal gait pattern to access community.  Baseline: 100 ft Goal status: INITIAL  5.  Patient will be able to ascend/descend stairs with 1 HR and reciprocal step pattern safely to access home and community.  Baseline: 2 rails with step to pattern Goal status: INITIAL  6.  Patient will report no pain L knee. Baseline: 8/10 Goal status: INITIAL  PLAN:  PT FREQUENCY: 2x/week  PT DURATION: 8 weeks  PLANNED INTERVENTIONS: Therapeutic exercises, Therapeutic activity, Neuromuscular re-education, Balance training, Gait training, Patient/Family education, Self Care, Joint mobilization, Stair training, DME instructions, Dry Needling, Electrical stimulation, Cryotherapy, Vasopneumatic device, and Manual therapy  PLAN FOR NEXT SESSION: Update HEP, add more standing there ex, LE ROM and strengthening, gait training to normalize pattern.   Ked[place], PT 07/20/2022, 10:29 AM

## 2022-07-25 ENCOUNTER — Ambulatory Visit: Payer: 59 | Attending: Orthopaedic Surgery

## 2022-07-25 DIAGNOSIS — M6281 Muscle weakness (generalized): Secondary | ICD-10-CM | POA: Diagnosis not present

## 2022-07-25 DIAGNOSIS — R600 Localized edema: Secondary | ICD-10-CM | POA: Diagnosis not present

## 2022-07-25 DIAGNOSIS — R2689 Other abnormalities of gait and mobility: Secondary | ICD-10-CM

## 2022-07-25 DIAGNOSIS — M25562 Pain in left knee: Secondary | ICD-10-CM | POA: Diagnosis not present

## 2022-07-25 NOTE — Therapy (Signed)
OUTPATIENT PHYSICAL THERAPY LOWER EXTREMITY TREATMENT  Patient Name: Amber Mccall MRN: [id number] DOB:10/10/1950, 72 y.o., female Today's Date: 07/25/2022  END OF SESSION:  PT End of Session - 07/25/22 0757     Visit Number 7    Number of Visits 16    Date for PT Re-Evaluation 09/04/22    Authorization Type [employer] medicare/medicaid    PT Start Time 0800    PT Stop Time 0855    PT Time Calculation (min) 55 min    Activity Tolerance Patient tolerated treatment well    Behavior During Therapy [facility] for tasks assessed/performed             Past Medical History:  Diagnosis Date   Anginal pain    pt says she feels like this may be related to acid reflux   Closed fracture of metatarsal bone(s) 01/30/2013   Depression    Essential hypertension    GERD (gastroesophageal reflux disease)    Lumbar radiculopathy    Osteoarthritis    Osteoporosis    Past Surgical History:  Procedure Laterality Date   ABDOMINAL HYSTERECTOMY     APPENDECTOMY     CATARACT EXTRACTION Bilateral 2022   [place] Osteotomy w/ Bone Graft Left 04/16/2015   Left foot   IR RADIOLOGIST EVAL & MGMT  04/11/2017   OPEN REDUCTION INTERNAL FIXATION (ORIF) DISTAL PHALANX Left 10/23/2014   5th Metatarsal   TOTAL KNEE ARTHROPLASTY Right 10/18/2021   Procedure: RIGHT TOTAL KNEE ARTHROPLASTY;  Surgeon: Leandrew Koyanagi, MD;  Location: [place];  Service: Orthopedics;  Laterality: Right;   TOTAL KNEE ARTHROPLASTY Left 06/20/2022   Procedure: LEFT TOTAL KNEE REPLACEMENT;  Surgeon: Leandrew Koyanagi, MD;  Location: [place];  Service: Orthopedics;  Laterality: Left;   Patient Active Problem List   Diagnosis Date Noted   Status post total left knee replacement 06/20/2022   Status post total right knee replacement 10/18/2021   Primary osteoarthritis of right knee 10/17/2021   Spondylosis without myelopathy or radiculopathy, lumbar region 09/28/2021   Chronic right shoulder pain 08/18/2021   Adhesive capsulitis of right shoulder 08/18/2021    Gastroesophageal reflux disease 08/18/2021   Primary osteoarthritis of left knee 07/27/2021   Primary osteoarthritis involving multiple joints 07/13/2021   Strain of right trapezius muscle 07/13/2021   Essential hypertension 05/19/2021   Hypertriglyceridemia 05/19/2021   Osteoporosis 05/19/2021   Lumbar radiculopathy 05/19/2021   Osteoarthritis 05/19/2021   Depression, major, single episode, in partial remission 05/19/2021   Chronic right-sided low back pain 05/19/2021   Status post left foot surgery 04/23/2015   Injury of foot, left 04/08/2015   Sprain of ankle 04/08/2015   Deformity of metatarsal bone of left foot 04/08/2015   Metatarsalgia of left foot 02/10/2015   Tenosynovitis of foot 02/10/2015   Chronic pain of both knees 02/10/2015   Closed fracture of metatarsal bone 10/21/2014   Edema of left foot 10/21/2014   Closed fracture of metatarsal bone(s) 01/30/2013   Pain, foot 01/30/2013   Abnormal foot finding 01/30/2013    PCP: Riki Sheer DO REFERRING PROVIDER: Frankey Shown REFERRING DIAG: L TKR  THERAPY DIAG:  Muscle weakness (generalized)  Other abnormalities of gait and mobility  Localized edema  Acute pain of left knee  Rationale for Evaluation and Treatment: Rehabilitation  ONSET DATE: 06/20/22 L TKR  SUBJECTIVE:  SUBJECTIVE STATEMENT: Patient report 6/10 pain today, states she has been using SPC inside her home nad rollator when she goes outside. Patient states she was "  woken up screaming from the knee pain the last two nights". Patient states her knee feels "twisted". [In-person interpreter]  PERTINENT HISTORY: HTN, 2016 L foot surgery, prior R TKR, 2x back surgery, and appendectomy, and hysterectomy, and cateract surgery.  PAIN:  Are you having pain? Yes: NPRS scale: 4/10 Pain location: L knee Pain description: surgical pain, acute pain Aggravating factors: always there Relieving factors: rest, ice PRECAUTIONS: None WEIGHT BEARING  RESTRICTIONS:  WBAT FALLS:  Has patient fallen in last 6 months? No PATIENT GOALS: no specific goal. Relieve the pain and return to daily life.   NEXT MD VISIT: Beginning April (08/02/22?)  OBJECTIVE:  (Measures in this section from initial evaluation unless otherwise noted) PATIENT SURVEYS:  N/a due to interpreter services SENSATION: WFL EDEMA:  Circumferential: 39cm on L and 37 on R Bruising along lateral proximal thigh Incision viewed with no drainage noted POSTURE:  maintains L knee flexion in standing PALPATION: Tender to palpation in lateral quad and along knee LOWER EXTREMITY ROM:  Active ROM Right eval Left eval Left 07/14/22  Knee flexion 115 98 120  Knee extension 0 10 Long arc quad extensor lag 10  Long arc quad extensor lag  LOWER EXTREMITY MMT:  MMT Right eval Left eval  Knee flexion  3/5  Knee extension  3/5  Ankle dorsiflexion    Ankle plantarflexion    Ankle inversion    Ankle eversion     (Blank rows = not tested) FUNCTIONAL TESTS:  5 times sit to stand: 24.08 seconds 10 meter walk test: 1.96 ft/sec gait speed GAIT: Distance walked: 100 ft Assistive device utilized: Environmental consultant - 4 wheeled Level of assistance: Modified independence Comments: dec'd L knee extension and dec'd heel strike Stairs mod indep with 2 rails step to pattern  TODAY'S TREATMENT:  [facility] Adult PT Treatment:                                                DATE: 07/25/2022 Therapeutic Exercise: Recumbent bike x 5 minutes partial revolutions + intake subjecitve Walking Fwd & Bkwd with SPC  Standing Squat x 10 reps STS no HHA x5 --> staggered stance STS (L foot back)  Heel raises 2x10 Step ups to 4" surface x 10 Step downs from 4" surface leading with R x 10 Hip abd x10 B Supine SLR + quad set x 10 AAROM heel slides w/strap x10 Bridges 2x10 (ball squeeze) Prone TKE passive stretch (overpressure from 3#AW) x 46min Modalities: Game Ready, 34 degrees, low compression x 10 min      [facility] Adult PT Treatment:                                                DATE: 07/20/22 Therapeutic Exercise: Bike x 5 minutes partial revolutions while intake subjecitve Standing Mini squat x 12 reps Heel raises x 12 reps Step ups to 4" surface x 10 reps Step downs from 4" surface leading with R LE x 10 reps Supine Heel slides x 10 reps Bridges x 10 reps Quad sets x 10 reps Manual Therapy: Overpressure into extension Overpressure into flexion Gait: With SPC x 50 feet x 4 reps with cues on sequencing * recommending cane use in home and  rollator outdoors Modalities: Game ready x 10 minutes L knee   PATIENT EDUCATION:  Education details: Self-monitoring exertion and mobility within comfortable range to decrease over exertion tendencies. Person educated: Patient Education method: Explanation, Demonstration, and Handouts Education comprehension: verbalized understanding and returned demonstration  HOME EXERCISE PROGRAM: Access Code: [email or web address] URL: [email or web address] Date: 07/25/2022 Prepared by: Helane Gunther  Exercises - Quad Setting and Stretching  - 2 x daily - 7 x weekly - 1 sets - 10 reps - Supine Heel Slide  - 2 x daily - 7 x weekly - 1 sets - 10 reps - Supine Active Straight Leg Raise  - 2 x daily - 7 x weekly - 1 sets - 10 reps - Seated Knee Flexion Extension AROM   - 2 x daily - 7 x weekly - 1 sets - 10 reps - Seated Long Arc Quad  - 2 x daily - 7 x weekly - 1 sets - 10 reps - Standing Weight Shift Side to Side  - 1 x daily - 7 x weekly - 3 sets - 10 reps - Standing Hip Abduction with Counter Support  - 1 x daily - 7 x weekly - 3 sets - 10 reps - Standing Hip Abduction Kicks  - 1 x daily - 7 x weekly - 3 sets - 10 reps - Standing Hip Extension with Counter Support  - 1 x daily - 7 x weekly - 3 sets - 10 reps  ASSESSMENT:  CLINICAL IMPRESSION: Patient had difficulty transitioning to knee AROm flexion after extension focused exercises; AAROM heel  slides with strap assist alleviated tightness and tension. Tactile cues needed to improve upright standing posture and maintain parallel LE alignment during standing hip abd.  OBJECTIVE IMPAIRMENTS: Abnormal gait, decreased activity tolerance, decreased balance, decreased mobility, difficulty walking, decreased ROM, decreased strength, increased edema, impaired flexibility, and pain.   GOALS: Goals reviewed with patient? Yes   SHORT TERM GOALS: Target date: 08/03/2022    Independent with initial HEP. Baseline: initiated today Goal status: ACHIEVED 07/20/22  2.  Reduce pain to < or equal to 4/10. Baseline: 8/10 Goal status: ACHIEVED 07/20/22  LONG TERM GOALS: Target date: 08/31/2022   Independent with advanced/ongoing HEP to improve outcomes and carryover.  Baseline: initiated HEP today Goal status: INITIAL  2.  Patient will demonstrate Left knee flexion to 115 deg to ascend/descend stairs. Baseline: 98 Goal status: INITIAL  3.  Patient will demonstrate full left knee extension for safety with gait. Baseline: 10 degrees Goal status: INITIAL  4.  Patient will be able to ambulate 400' safely with LRAD and normal gait pattern to access community.  Baseline: 100 ft Goal status: INITIAL  5.  Patient will be able to ascend/descend stairs with 1 HR and reciprocal step pattern safely to access home and community.  Baseline: 2 rails with step to pattern Goal status: INITIAL  6.  Patient will report no pain L knee. Baseline: 8/10 Goal status: INITIAL  PLAN:  PT FREQUENCY: 2x/week  PT DURATION: 8 weeks  PLANNED INTERVENTIONS: Therapeutic exercises, Therapeutic activity, Neuromuscular re-education, Balance training, Gait training, Patient/Family education, Self Care, Joint mobilization, Stair training, DME instructions, Dry Needling, Electrical stimulation, Cryotherapy, Vasopneumatic device, and Manual therapy  PLAN FOR NEXT SESSION: Progress standing there ex, LE ROM and  strengthening, gait training to normalize pattern. LLLD prone TKE - try 5#AW   Hardin Negus, PTA 07/25/2022, 8:53 AM

## 2022-07-27 ENCOUNTER — Ambulatory Visit: Payer: 59

## 2022-07-27 DIAGNOSIS — R600 Localized edema: Secondary | ICD-10-CM | POA: Diagnosis not present

## 2022-07-27 DIAGNOSIS — R2689 Other abnormalities of gait and mobility: Secondary | ICD-10-CM

## 2022-07-27 DIAGNOSIS — M6281 Muscle weakness (generalized): Secondary | ICD-10-CM | POA: Diagnosis not present

## 2022-07-27 DIAGNOSIS — M25562 Pain in left knee: Secondary | ICD-10-CM | POA: Diagnosis not present

## 2022-07-27 NOTE — Therapy (Signed)
OUTPATIENT PHYSICAL THERAPY LOWER EXTREMITY TREATMENT  Patient Name: Amber Mccall MRN: [id number] DOB:[birth year], 72 y.o., female Today's Date: 07/27/2022  END OF SESSION:  PT End of Session - 07/27/22 0800     Visit Number 8    Number of Visits 16    Date for PT Re-Evaluation 09/04/22    Authorization Type [employer]HC medicare/medicaid    PT Start Time 0800    PT Stop Time 0900    PT Time Calculation (min) 60 min    Activity Tolerance Patient tolerated treatment well    Behavior During Therapy [facility] for tasks assessed/performed             Past Medical History:  Diagnosis Date   Anginal pain    pt says she feels like this may be related to acid reflux   Closed fracture of metatarsal bone(s) 01/30/2013   Depression    Essential hypertension    GERD (gastroesophageal reflux disease)    Lumbar radiculopathy    Osteoarthritis    Osteoporosis    Past Surgical History:  Procedure Laterality Date   ABDOMINAL HYSTERECTOMY     APPENDECTOMY     CATARACT EXTRACTION Bilateral 2022   [place] Osteotomy w/ Bone Graft Left 04/16/2015   Left foot   IR RADIOLOGIST EVAL & MGMT  04/11/2017   OPEN REDUCTION INTERNAL FIXATION (ORIF) DISTAL PHALANX Left 10/23/2014   5th Metatarsal   TOTAL KNEE ARTHROPLASTY Right 10/18/2021   Procedure: RIGHT TOTAL KNEE ARTHROPLASTY;  Surgeon: Leandrew Koyanagi, MD;  Location: [place];  Service: Orthopedics;  Laterality: Right;   TOTAL KNEE ARTHROPLASTY Left 06/20/2022   Procedure: LEFT TOTAL KNEE REPLACEMENT;  Surgeon: Leandrew Koyanagi, MD;  Location: [place];  Service: Orthopedics;  Laterality: Left;   Patient Active Problem List   Diagnosis Date Noted   Status post total left knee replacement 06/20/2022   Status post total right knee replacement 10/18/2021   Primary osteoarthritis of right knee 10/17/2021   Spondylosis without myelopathy or radiculopathy, lumbar region 09/28/2021   Chronic right shoulder pain 08/18/2021   Adhesive capsulitis of right shoulder 08/18/2021    Gastroesophageal reflux disease 08/18/2021   Primary osteoarthritis of left knee 07/27/2021   Primary osteoarthritis involving multiple joints 07/13/2021   Strain of right trapezius muscle 07/13/2021   Essential hypertension 05/19/2021   Hypertriglyceridemia 05/19/2021   Osteoporosis 05/19/2021   Lumbar radiculopathy 05/19/2021   Osteoarthritis 05/19/2021   Depression, major, single episode, in partial remission 05/19/2021   Chronic right-sided low back pain 05/19/2021   Status post left foot surgery 04/23/2015   Injury of foot, left 04/08/2015   Sprain of ankle 04/08/2015   Deformity of metatarsal bone of left foot 04/08/2015   Metatarsalgia of left foot 02/10/2015   Tenosynovitis of foot 02/10/2015   Chronic pain of both knees 02/10/2015   Closed fracture of metatarsal bone 10/21/2014   Edema of left foot 10/21/2014   Closed fracture of metatarsal bone(s) 01/30/2013   Pain, foot 01/30/2013   Abnormal foot finding 01/30/2013    PCP: Riki Sheer DO REFERRING PROVIDER: Frankey Shown REFERRING DIAG: L TKR  THERAPY DIAG:  Muscle weakness (generalized)  Other abnormalities of gait and mobility  Localized edema  Acute pain of left knee  Rationale for Evaluation and Treatment: Rehabilitation  ONSET DATE: 06/20/22 L TKR  SUBJECTIVE:  SUBJECTIVE STATEMENT: Patient reports 5/10 pain in knee today, states she felt better after last visit but is still being woken up with pain at night.  Patient states she finds she leans to the L when using cane.  [Interpreter via [employer]]  PERTINENT HISTORY: HTN, 2016 L foot surgery, prior R TKR, 2x back surgery, and appendectomy, and hysterectomy, and cateract surgery.  PAIN:  Are you having pain? Yes: NPRS scale: 4/10 Pain location: L knee Pain description: surgical pain, acute pain Aggravating factors: always there Relieving factors: rest, ice PRECAUTIONS: None WEIGHT BEARING RESTRICTIONS:  WBAT FALLS:  Has patient fallen in  last 6 months? No PATIENT GOALS: no specific goal. Relieve the pain and return to daily life.   NEXT MD VISIT: 08/02/22  OBJECTIVE:  (Measures in this section from initial evaluation unless otherwise noted) PATIENT SURVEYS:  N/a due to interpreter services SENSATION: WFL EDEMA:  Circumferential: 39cm on L and 37 on R Bruising along lateral proximal thigh Incision viewed with no drainage noted POSTURE:  maintains L knee flexion in standing PALPATION: Tender to palpation in lateral quad and along knee LOWER EXTREMITY ROM:  Active ROM Right eval Left eval Left 07/14/22  Knee flexion 115 98 120  Knee extension 0 10 Long arc quad extensor lag 10  Long arc quad extensor lag  LOWER EXTREMITY MMT:  MMT Right eval Left eval  Knee flexion  3/5  Knee extension  3/5  Ankle dorsiflexion    Ankle plantarflexion    Ankle inversion    Ankle eversion     (Blank rows = not tested) FUNCTIONAL TESTS:  5 times sit to stand: 24.08 seconds 10 meter walk test: 1.96 ft/sec gait speed GAIT: Distance walked: 100 ft Assistive device utilized: Environmental consultant - 4 wheeled Level of assistance: Modified independence Comments: dec'd L knee extension and dec'd heel strike Stairs mod indep with 2 rails step to pattern  TODAY'S TREATMENT:  [facility] Adult PT Treatment:                                                DATE: 07/27/2022 Therapeutic Exercise: Recumbent bike x 5 minutes partial revolutions + intake subjecitve AAROM heel slides w/strap 10x5" TKE measurement (-10 extensor lag) Prone passive TKE stretch - LLLD 4#AW x 42min Prone passive quad stretch  Seated LAQ (L) 5#AW --> no weight (anterior hip discomfort) 2x10x5" Side stepping RTB above knees --> crossed at ankles Squats --> added heel raises in standing Walking with SPC Modalities: Game Ready, 34 degrees, low compression x 10 min    [facility] Adult PT Treatment:                                                DATE: 07/25/2022 Therapeutic  Exercise: Recumbent bike x 5 minutes partial revolutions + intake subjecitve Walking Fwd & Bkwd with SPC  Standing Squat x 10 reps STS no HHA x5 --> staggered stance STS (L foot back)  Heel raises 2x10 Step ups to 4" surface x 10 Step downs from 4" surface leading with R x 10 Hip abd x10 B Supine SLR + quad set x 10 AAROM heel slides w/strap x10 Bridges 2x10 (ball squeeze) Prone TKE passive stretch (overpressure from 3#AW) x 43min Modalities: Game Ready, 34 degrees, low compression x 10 min     [facility] Adult PT Treatment:  DATE: 07/20/22 Therapeutic Exercise: Bike x 5 minutes partial revolutions while intake subjecitve Standing Mini squat x 12 reps Heel raises x 12 reps Step ups to 4" surface x 10 reps Step downs from 4" surface leading with R LE x 10 reps Supine Heel slides x 10 reps Bridges x 10 reps Quad sets x 10 reps Manual Therapy: Overpressure into extension Overpressure into flexion Gait: With SPC x 50 feet x 4 reps with cues on sequencing * recommending cane use in home and rollator outdoors Modalities: Game ready x 10 minutes L knee   PATIENT EDUCATION:  Education details: Self-monitoring exertion and mobility within comfortable range to decrease over exertion tendencies. Person educated: Patient Education method: Explanation, Demonstration, and Handouts Education comprehension: verbalized understanding and returned demonstration  HOME EXERCISE PROGRAM: Access Code: [email or web address] URL: [email or web address] Date: 07/25/2022 Prepared by: Helane Gunther  Exercises - Quad Setting and Stretching  - 2 x daily - 7 x weekly - 1 sets - 10 reps - Supine Heel Slide  - 2 x daily - 7 x weekly - 1 sets - 10 reps - Supine Active Straight Leg Raise  - 2 x daily - 7 x weekly - 1 sets - 10 reps - Seated Knee Flexion Extension AROM   - 2 x daily - 7 x weekly - 1 sets - 10 reps - Seated Long Arc Quad  - 2 x daily - 7 x  weekly - 1 sets - 10 reps - Standing Weight Shift Side to Side  - 1 x daily - 7 x weekly - 3 sets - 10 reps - Standing Hip Abduction with Counter Support  - 1 x daily - 7 x weekly - 3 sets - 10 reps - Standing Hip Abduction Kicks  - 1 x daily - 7 x weekly - 3 sets - 10 reps - Standing Hip Extension with Counter Support  - 1 x daily - 7 x weekly - 3 sets - 10 reps  ASSESSMENT:  CLINICAL IMPRESSION: Low load long duration continues to progress total knee extension ROM. Forward lean compensation exhibited during standing heel raises; cueing improved alignment. Less postural lean noted to L during ambulation with SPC.   OBJECTIVE IMPAIRMENTS: Abnormal gait, decreased activity tolerance, decreased balance, decreased mobility, difficulty walking, decreased ROM, decreased strength, increased edema, impaired flexibility, and pain.   GOALS: Goals reviewed with patient? Yes   SHORT TERM GOALS: Target date: 08/03/2022    Independent with initial HEP. Baseline: initiated today Goal status: ACHIEVED 07/20/22  2.  Reduce pain to < or equal to 4/10. Baseline: 8/10 Goal status: ACHIEVED 07/20/22  LONG TERM GOALS: Target date: 08/31/2022   Independent with advanced/ongoing HEP to improve outcomes and carryover.  Baseline: initiated HEP today Goal status: INITIAL  2.  Patient will demonstrate Left knee flexion to 115 deg to ascend/descend stairs. Baseline: 98 Goal status: INITIAL  3.  Patient will demonstrate full left knee extension for safety with gait. Baseline: 10 degrees Goal status: INITIAL  4.  Patient will be able to ambulate 400' safely with LRAD and normal gait pattern to access community.  Baseline: 100 ft Goal status: INITIAL  5.  Patient will be able to ascend/descend stairs with 1 HR and reciprocal step pattern safely to access home and community.  Baseline: 2 rails with step to pattern Goal status: INITIAL  6.  Patient will report no pain L knee. Baseline: 8/10 Goal status:  INITIAL  PLAN:  PT FREQUENCY: 2x/week  PT DURATION: 8  weeks  PLANNED INTERVENTIONS: Therapeutic exercises, Therapeutic activity, Neuromuscular re-education, Balance training, Gait training, Patient/Family education, Self Care, Joint mobilization, Stair training, DME instructions, Dry Needling, Electrical stimulation, Cryotherapy, Vasopneumatic device, and Manual therapy  PLAN FOR NEXT SESSION: Progress standing there ex, LE ROM and strengthening, gait training to normalize pattern. LLLD prone TKE - try 5#AW   Hardin Negus, PTA 07/27/2022, 8:56 AM

## 2022-08-02 ENCOUNTER — Ambulatory Visit (INDEPENDENT_AMBULATORY_CARE_PROVIDER_SITE_OTHER): Payer: 59 | Admitting: Physician Assistant

## 2022-08-02 ENCOUNTER — Other Ambulatory Visit (INDEPENDENT_AMBULATORY_CARE_PROVIDER_SITE_OTHER): Payer: 59

## 2022-08-02 ENCOUNTER — Encounter: Payer: Self-pay | Admitting: Physician Assistant

## 2022-08-02 DIAGNOSIS — Z96652 Presence of left artificial knee joint: Secondary | ICD-10-CM

## 2022-08-02 MED ORDER — HYDROCODONE-ACETAMINOPHEN 5-325 MG PO TABS: 1.0000 | ORAL_TABLET | Freq: Three times a day (TID) | ORAL | 0 refills | Status: DC | PRN

## 2022-08-02 MED ORDER — METHOCARBAMOL 750 MG PO TABS: 750.0000 mg | ORAL_TABLET | Freq: Two times a day (BID) | ORAL | 2 refills | Status: DC | PRN

## 2022-08-02 NOTE — Progress Notes (Signed)
Post-Op Visit Note   Patient: Amber Mccall           Date of Birth: Feb 13, 1951           MRN: [id number] Visit Date: 08/02/2022 PCP: Sharlene Dory, DO   Assessment & Plan:  Chief Complaint:  Chief Complaint  Patient presents with   Left Knee - Pain    Left total knee arthroplasty 06/20/2022   Visit Diagnoses:  1. Status post total left knee replacement     Plan: Patient is a pleasant 72 year old [place] speaking female who is here today with interpreter.  She is 6 weeks status post left total knee replacement, date of surgery 06/20/2022.  She is still in a fair amount of pain which is relieved with Robaxin and oxycodone.  She has finished her Xarelto for which she was taking for DVT prophylaxis.  She is still in outpatient physical therapy making good progress.  She is ambulating with a rollator today here in our office.  Examination of her left knee reveals a fully healed surgical scar without complication.  Calves are soft nontender.  Range of motion 0 to 110 degrees.  She is stable to valgus and varus stress.  She is neurovascularly intact distally.  At this point, she will continue with physical therapy and wean to home exercise program when cleared.  I refilled her Robaxin and have sent in a prescription for Norco.  Dental prophylaxis reinforced.  Follow-up in 6 weeks for recheck.  Call with concerns or questions.  Follow-Up Instructions: Return in about 6 weeks (around 09/13/2022).   Orders:  Orders Placed This Encounter  Procedures   XR Knee 1-2 Views Left   Meds ordered this encounter  Medications   HYDROcodone-acetaminophen (NORCO) 5-325 MG tablet    Sig: Take 1 tablet by mouth 3 (three) times daily as needed.    Dispense:  30 tablet    Refill:  0   methocarbamol (ROBAXIN-750) 750 MG tablet    Sig: Take 1 tablet (750 mg total) by mouth 2 (two) times daily as needed for muscle spasms.    Dispense:  20 tablet    Refill:  2    Imaging: XR Knee 1-2 Views  Left  Result Date: 08/02/2022 Well seated prosthesis without complication   PMFS History: Patient Active Problem List   Diagnosis Date Noted   Status post total left knee replacement 06/20/2022   Status post total right knee replacement 10/18/2021   Primary osteoarthritis of right knee 10/17/2021   Spondylosis without myelopathy or radiculopathy, lumbar region 09/28/2021   Chronic right shoulder pain 08/18/2021   Adhesive capsulitis of right shoulder 08/18/2021   Gastroesophageal reflux disease 08/18/2021   Primary osteoarthritis of left knee 07/27/2021   Primary osteoarthritis involving multiple joints 07/13/2021   Strain of right trapezius muscle 07/13/2021   Essential hypertension 05/19/2021   Hypertriglyceridemia 05/19/2021   Osteoporosis 05/19/2021   Lumbar radiculopathy 05/19/2021   Osteoarthritis 05/19/2021   Depression, major, single episode, in partial remission 05/19/2021   Chronic right-sided low back pain 05/19/2021   Status post left foot surgery 04/23/2015   Injury of foot, left 04/08/2015   Sprain of ankle 04/08/2015   Deformity of metatarsal bone of left foot 04/08/2015   Metatarsalgia of left foot 02/10/2015   Tenosynovitis of foot 02/10/2015   Chronic pain of both knees 02/10/2015   Closed fracture of metatarsal bone 10/21/2014   Edema of left foot 10/21/2014   Closed fracture of metatarsal  bone(s) 01/30/2013   Pain, foot 01/30/2013   Abnormal foot finding 01/30/2013   Past Medical History:  Diagnosis Date   Anginal pain    pt says she feels like this may be related to acid reflux   Closed fracture of metatarsal bone(s) 01/30/2013   Depression    Essential hypertension    GERD (gastroesophageal reflux disease)    Lumbar radiculopathy    Osteoarthritis    Osteoporosis     Family History  Problem Relation Age of Onset   Colon cancer Neg Hx    Rectal cancer Neg Hx    Stomach cancer Neg Hx     Past Surgical History:  Procedure Laterality Date    ABDOMINAL HYSTERECTOMY     APPENDECTOMY     CATARACT EXTRACTION Bilateral 2022   Cotton Osteotomy w/ Bone Graft Left 04/16/2015   Left foot   IR RADIOLOGIST EVAL & MGMT  04/11/2017   OPEN REDUCTION INTERNAL FIXATION (ORIF) DISTAL PHALANX Left 10/23/2014   5th Metatarsal   TOTAL KNEE ARTHROPLASTY Right 10/18/2021   Procedure: RIGHT TOTAL KNEE ARTHROPLASTY;  Surgeon: Tarry Kos, MD;  Location: [facility] OR;  Service: Orthopedics;  Laterality: Right;   TOTAL KNEE ARTHROPLASTY Left 06/20/2022   Procedure: LEFT TOTAL KNEE REPLACEMENT;  Surgeon: Tarry Kos, MD;  Location: [facility] OR;  Service: Orthopedics;  Laterality: Left;   Social History   Occupational History   Occupation: retired  Tobacco Use   Smoking status: Never   Smokeless tobacco: Never  Vaping Use   Vaping Use: Never used  Substance and Sexual Activity   Alcohol use: Not Currently   Drug use: Not Currently   Sexual activity: Not on file

## 2022-08-03 ENCOUNTER — Ambulatory Visit: Payer: 59

## 2022-08-03 DIAGNOSIS — M6281 Muscle weakness (generalized): Secondary | ICD-10-CM | POA: Diagnosis not present

## 2022-08-03 DIAGNOSIS — R2689 Other abnormalities of gait and mobility: Secondary | ICD-10-CM | POA: Diagnosis not present

## 2022-08-03 DIAGNOSIS — R600 Localized edema: Secondary | ICD-10-CM

## 2022-08-03 DIAGNOSIS — M25562 Pain in left knee: Secondary | ICD-10-CM | POA: Diagnosis not present

## 2022-08-03 NOTE — Therapy (Signed)
OUTPATIENT PHYSICAL THERAPY LOWER EXTREMITY TREATMENT  Patient Name: Amber Mccall MRN: [id number] DOB:02-13-1951, 72 y.o., female Today's Date: 08/03/2022  END OF SESSION:  PT End of Session - 08/03/22 0842     Visit Number 9    Number of Visits 16    Date for PT Re-Evaluation 09/04/22    Authorization Type [employer] medicare/medicaid    Authorization - Visit Number 9    Authorization - Number of Visits 16    PT Start Time 0845    PT Stop Time 0940    PT Time Calculation (min) 55 min    Activity Tolerance Patient tolerated treatment well    Behavior During Therapy WFL for tasks assessed/performed             Past Medical History:  Diagnosis Date   Anginal pain    pt says she feels like this may be related to acid reflux   Closed fracture of metatarsal bone(s) 01/30/2013   Depression    Essential hypertension    GERD (gastroesophageal reflux disease)    Lumbar radiculopathy    Osteoarthritis    Osteoporosis    Past Surgical History:  Procedure Laterality Date   ABDOMINAL HYSTERECTOMY     APPENDECTOMY     CATARACT EXTRACTION Bilateral 2022   Cotton Osteotomy w/ Bone Graft Left 04/16/2015   Left foot   IR RADIOLOGIST EVAL & MGMT  04/11/2017   OPEN REDUCTION INTERNAL FIXATION (ORIF) DISTAL PHALANX Left 10/23/2014   5th Metatarsal   TOTAL KNEE ARTHROPLASTY Right 10/18/2021   Procedure: RIGHT TOTAL KNEE ARTHROPLASTY;  Surgeon: Tarry Kos, MD;  Location: [facility] OR;  Service: Orthopedics;  Laterality: Right;   TOTAL KNEE ARTHROPLASTY Left 06/20/2022   Procedure: LEFT TOTAL KNEE REPLACEMENT;  Surgeon: Tarry Kos, MD;  Location: [facility] OR;  Service: Orthopedics;  Laterality: Left;   Patient Active Problem List   Diagnosis Date Noted   Status post total left knee replacement 06/20/2022   Status post total right knee replacement 10/18/2021   Primary osteoarthritis of right knee 10/17/2021   Spondylosis without myelopathy or radiculopathy, lumbar region 09/28/2021   Chronic right  shoulder pain 08/18/2021   Adhesive capsulitis of right shoulder 08/18/2021   Gastroesophageal reflux disease 08/18/2021   Primary osteoarthritis of left knee 07/27/2021   Primary osteoarthritis involving multiple joints 07/13/2021   Strain of right trapezius muscle 07/13/2021   Essential hypertension 05/19/2021   Hypertriglyceridemia 05/19/2021   Osteoporosis 05/19/2021   Lumbar radiculopathy 05/19/2021   Osteoarthritis 05/19/2021   Depression, major, single episode, in partial remission 05/19/2021   Chronic right-sided low back pain 05/19/2021   Status post left foot surgery 04/23/2015   Injury of foot, left 04/08/2015   Sprain of ankle 04/08/2015   Deformity of metatarsal bone of left foot 04/08/2015   Metatarsalgia of left foot 02/10/2015   Tenosynovitis of foot 02/10/2015   Chronic pain of both knees 02/10/2015   Closed fracture of metatarsal bone 10/21/2014   Edema of left foot 10/21/2014   Closed fracture of metatarsal bone(s) 01/30/2013   Pain, foot 01/30/2013   Abnormal foot finding 01/30/2013    PCP: Arva Chafe DO REFERRING PROVIDER: Gershon Mussel REFERRING DIAG: L TKR  THERAPY DIAG:  Muscle weakness (generalized)  Other abnormalities of gait and mobility  Localized edema  Acute pain of left knee  Rationale for Evaluation and Treatment: Rehabilitation  ONSET DATE: 06/20/22 L TKR  SUBJECTIVE:  SUBJECTIVE STATEMENT: Patient reports she received good report from  MD. Patient states she uses no AD at home but continues to use rollator outside of home. Patient reports 5/10 pain in knee today, states she is able to sleep through night with medication but without it she wakes up from pain.  [Interpreter present]  PERTINENT HISTORY: HTN, 2016 L foot surgery, prior R TKR, 2x back surgery, and appendectomy, and hysterectomy, and cateract surgery.  PAIN:  Are you having pain? Yes: NPRS scale: 4/10 Pain location: L knee Pain description: surgical pain, acute  pain Aggravating factors: always there Relieving factors: rest, ice PRECAUTIONS: None WEIGHT BEARING RESTRICTIONS:  WBAT FALLS:  Has patient fallen in last 6 months? No PATIENT GOALS: no specific goal. Relieve the pain and return to daily life.   NEXT MD VISIT:  OBJECTIVE:  (Measures in this section from initial evaluation unless otherwise noted) PATIENT SURVEYS:  N/a due to interpreter services SENSATION: WFL EDEMA:  Circumferential: 39cm on L and 37 on R Bruising along lateral proximal thigh Incision viewed with no drainage noted POSTURE:  maintains L knee flexion in standing PALPATION: Tender to palpation in lateral quad and along knee LOWER EXTREMITY ROM:  Active ROM Right eval Left eval Left 07/14/22 Left 08/03/22  Knee flexion 115 98 120 120  Knee extension 0 10 Long arc quad extensor lag 10  Long arc quad extensor lag 4 extensor lag  LOWER EXTREMITY MMT:  MMT Right eval Left eval Left   Knee flexion  3/5   Knee extension  3/5   Ankle dorsiflexion     Ankle plantarflexion     Ankle inversion     Ankle eversion      (Blank rows = not tested) FUNCTIONAL TESTS:  5 times sit to stand: 24.08 seconds 10 meter walk test: 1.96 ft/sec gait speed GAIT: Distance walked: 100 ft Assistive device utilized: Environmental consultant - 4 wheeled Level of assistance: Modified independence Comments: dec'd L knee extension and dec'd heel strike Stairs mod indep with 2 rails step to pattern  TODAY'S TREATMENT:  [facility] Adult PT Treatment:                                                DATE: 08/03/2022 Therapeutic Exercise: NuStep L7 x Walking with no AD Prone passive TKE stretch (LLLD) 5#AW x AAROM heel slides 10x10" Knee extension stretch on towel roll 10x5" Bridges + hip abd 2x10 seated LAQ 3#AW 2x10 S/L resisted clamshells RTB 2x10 S/L hip ext + abd/IR x12 Seated LAQ 3#AW 2x10 Modalities: Game Ready, 34 degrees, low compression x 10 min     [facility] Adult PT Treatment:                                                 DATE: 07/27/2022 Therapeutic Exercise: Recumbent bike x 5 minutes partial revolutions + intake subjecitve AAROM heel slides w/strap 10x5" TKE measurement (-10 extensor lag) Prone passive TKE stretch - LLLD 4#AW x Prone passive quad stretch  Seated LAQ (L) 5#AW --> no weight (anterior hip discomfort) 2x10x5" Side stepping RTB above knees --> crossed at ankles Squats --> added heel raises in standing Walking with SPC Modalities: Game Ready, 34 degrees, low compression x 10 min  PATIENT EDUCATION:  Education details: Self-monitoring exertion and mobility within comfortable range to decrease over exertion tendencies. Person educated: Patient Education method: Explanation, Demonstration, and Handouts Education comprehension: verbalized understanding and returned demonstration  HOME EXERCISE PROGRAM: Access Code: [email or web address] URL: [email or web address] Date: 08/03/2022 Prepared by: Carlynn HeraldKathryn Giovanny Dugal  Exercises - Quad Setting and Stretching  - 2 x daily - 7 x weekly - 1 sets - 10 reps - Supine Heel Slide  - 2 x daily - 7 x weekly - 1 sets - 10 reps - Supine Active Straight Leg Raise  - 2 x daily - 7 x weekly - 1 sets - 10 reps - Seated Knee Flexion Extension AROM   - 2 x daily - 7 x weekly - 1 sets - 10 reps - Seated Long Arc Quad  - 2 x daily - 7 x weekly - 1 sets - 10 reps - Standing Weight Shift Side to Side  - 1 x daily - 7 x weekly - 3 sets - 10 reps - Standing Hip Abduction with Counter Support  - 1 x daily - 7 x weekly - 3 sets - 10 reps - Standing Hip Abduction Kicks  - 1 x daily - 7 x weekly - 3 sets - 10 reps - Standing Hip Extension with Counter Support  - 1 x daily - 7 x weekly - 3 sets - 10 reps - Clam with Resistance  - 1 x daily - 7 x weekly - 3 sets - 10 reps - Bridge with Hip Abduction and Resistance  - 1 x daily - 7 x weekly - 3 sets - 10 reps - Supine Knee Extension Stretch on Towel Roll  - 1 x daily - 7  x weekly - 3 sets - 10 reps - Seated Long Arc Quad with Ankle Weight  - 1 x daily - 7 x weekly - 3 sets - 10 reps  ASSESSMENT:  CLINICAL IMPRESSION: Improved total knee extension demonstrated during resisted LAQ and knee extension stretch; knee extension AROM improved by 6 degrees as compared to previous measurement. Hip strengthening progressed with resisted hip abd variations.   OBJECTIVE IMPAIRMENTS: Abnormal gait, decreased activity tolerance, decreased balance, decreased mobility, difficulty walking, decreased ROM, decreased strength, increased edema, impaired flexibility, and pain.   GOALS: Goals reviewed with patient? Yes   SHORT TERM GOALS: Target date: 08/03/2022    Independent with initial HEP. Baseline: initiated today Goal status: ACHIEVED 07/20/22  2.  Reduce pain to < or equal to 4/10. Baseline: 8/10 Goal status: ACHIEVED 07/20/22  LONG TERM GOALS: Target date: 08/31/2022   Independent with advanced/ongoing HEP to improve outcomes and carryover.  Baseline: initiated HEP today Goal status: INITIAL  2.  Patient will demonstrate Left knee flexion to 115 deg to ascend/descend stairs. Baseline: 98 Goal status: INITIAL  3.  Patient will demonstrate full left knee extension for safety with gait. Baseline: 10 degrees Goal status: INITIAL  4.  Patient will be able to ambulate 400' safely with LRAD and normal gait pattern to access community.  Baseline: 100 ft Goal status: INITIAL  5.  Patient will be able to ascend/descend stairs with 1 HR and reciprocal step pattern safely to access home and community.  Baseline: 2 rails with step to pattern Goal status: INITIAL  6.  Patient will report no pain L knee. Baseline: 8/10 Goal status: INITIAL  PLAN:  PT FREQUENCY: 2x/week  PT DURATION: 8 weeks  PLANNED INTERVENTIONS: Therapeutic exercises, Therapeutic activity, Neuromuscular re-education, Balance training, Gait  training, Patient/Family education, Self Care, Joint  mobilization, Stair training, DME instructions, Dry Needling, Electrical stimulation, Cryotherapy, Vasopneumatic device, and Manual therapy  PLAN FOR NEXT SESSION: Check LTG. Progress standing there ex, LE ROM and strengthening, gait training to normalize pattern. LLLD prone TKE (5#AW)   Sanjuana Mae, PTA 08/03/2022, 9:32 AM

## 2022-08-05 ENCOUNTER — Other Ambulatory Visit (HOSPITAL_BASED_OUTPATIENT_CLINIC_OR_DEPARTMENT_OTHER): Payer: Self-pay

## 2022-08-05 ENCOUNTER — Ambulatory Visit: Payer: 59

## 2022-08-05 DIAGNOSIS — M25562 Pain in left knee: Secondary | ICD-10-CM | POA: Diagnosis not present

## 2022-08-05 DIAGNOSIS — R600 Localized edema: Secondary | ICD-10-CM | POA: Diagnosis not present

## 2022-08-05 DIAGNOSIS — M6281 Muscle weakness (generalized): Secondary | ICD-10-CM

## 2022-08-05 DIAGNOSIS — R2689 Other abnormalities of gait and mobility: Secondary | ICD-10-CM | POA: Diagnosis not present

## 2022-08-05 NOTE — Therapy (Addendum)
OUTPATIENT PHYSICAL THERAPY LOWER EXTREMITY TREATMENT and DISCHARGE SUMMARY  Patient Name: Jadah Bobak MRN: [id number] DOB:[birth year], 72 y.o., female Today's Date: 08/05/2022   PHYSICAL THERAPY DISCHARGE SUMMARY  Visits from Start of Care: 10  Current functional level related to goals / functional outcomes: Patient met 4/6 LTGs. She is working on remaining 2 LTGs and has HEP to address.   Remaining deficits: -4 degrees from full extension L knee Continues with occasional pain   Education / Equipment: HEP, home safety, home walking, post d/c activities   Patient agrees to discharge. Patient goals were partially met. Patient is being discharged due to meeting the stated rehab goals.  END OF SESSION:  PT End of Session - 08/05/22 1020     Visit Number 10    Number of Visits 16    Date for PT Re-Evaluation 09/04/22    Authorization Type [employer] medicare/medicaid    Authorization - Number of Visits 16    PT Start Time 1020    PT Stop Time 1053    PT Time Calculation (min) 33 min    Activity Tolerance Patient tolerated treatment well    Behavior During Therapy WFL for tasks assessed/performed             Past Medical History:  Diagnosis Date   Anginal pain    pt says she feels like this may be related to acid reflux   Closed fracture of metatarsal bone(s) 01/30/2013   Depression    Essential hypertension    GERD (gastroesophageal reflux disease)    Lumbar radiculopathy    Osteoarthritis    Osteoporosis    Past Surgical History:  Procedure Laterality Date   ABDOMINAL HYSTERECTOMY     APPENDECTOMY     CATARACT EXTRACTION Bilateral 2022   Cotton Osteotomy w/ Bone Graft Left 04/16/2015   Left foot   IR RADIOLOGIST EVAL & MGMT  04/11/2017   OPEN REDUCTION INTERNAL FIXATION (ORIF) DISTAL PHALANX Left 10/23/2014   5th Metatarsal   TOTAL KNEE ARTHROPLASTY Right 10/18/2021   Procedure: RIGHT TOTAL KNEE ARTHROPLASTY;  Surgeon: Tarry Kos, MD;  Location: [facility] OR;   Service: Orthopedics;  Laterality: Right;   TOTAL KNEE ARTHROPLASTY Left 06/20/2022   Procedure: LEFT TOTAL KNEE REPLACEMENT;  Surgeon: Tarry Kos, MD;  Location: [facility] OR;  Service: Orthopedics;  Laterality: Left;   Patient Active Problem List   Diagnosis Date Noted   Status post total left knee replacement 06/20/2022   Status post total right knee replacement 10/18/2021   Primary osteoarthritis of right knee 10/17/2021   Spondylosis without myelopathy or radiculopathy, lumbar region 09/28/2021   Chronic right shoulder pain 08/18/2021   Adhesive capsulitis of right shoulder 08/18/2021   Gastroesophageal reflux disease 08/18/2021   Primary osteoarthritis of left knee 07/27/2021   Primary osteoarthritis involving multiple joints 07/13/2021   Strain of right trapezius muscle 07/13/2021   Essential hypertension 05/19/2021   Hypertriglyceridemia 05/19/2021   Osteoporosis 05/19/2021   Lumbar radiculopathy 05/19/2021   Osteoarthritis 05/19/2021   Depression, major, single episode, in partial remission 05/19/2021   Chronic right-sided low back pain 05/19/2021   Status post left foot surgery 04/23/2015   Injury of foot, left 04/08/2015   Sprain of ankle 04/08/2015   Deformity of metatarsal bone of left foot 04/08/2015   Metatarsalgia of left foot 02/10/2015   Tenosynovitis of foot 02/10/2015   Chronic pain of both knees 02/10/2015   Closed fracture of metatarsal bone 10/21/2014   Edema of left  foot 10/21/2014   Closed fracture of metatarsal bone(s) 01/30/2013   Pain, foot 01/30/2013   Abnormal foot finding 01/30/2013    PCP: Arva Chafe DO REFERRING PROVIDER: Gershon Mussel REFERRING DIAG: L TKR  THERAPY DIAG:  Muscle weakness (generalized)  Other abnormalities of gait and mobility  Localized edema  Acute pain of left knee  Rationale for Evaluation and Treatment: Rehabilitation  ONSET DATE: 06/20/22 L TKR  SUBJECTIVE:  SUBJECTIVE STATEMENT: Patient arrived to  appointment with no AD today. Patient reports she has been working on [employer] daily and has a good supply of resistance bands, ankle weights and recumbent bike/leg press at home. Patient states she continues to have 5/10 pain however it does not limit her mobility or participation in ADLs. Patient requests discharging from PT early.  [Interpreter present]  PERTINENT HISTORY: HTN, 2016 L foot surgery, prior R TKR, 2x back surgery, and appendectomy, and hysterectomy, and cateract surgery.  PAIN:  Are you having pain? Yes: NPRS scale: 4/10 Pain location: L knee Pain description: surgical pain, acute pain Aggravating factors: always there Relieving factors: rest, ice PRECAUTIONS: None WEIGHT BEARING RESTRICTIONS:  WBAT FALLS:  Has patient fallen in last 6 months? No PATIENT GOALS: no specific goal. Relieve the pain and return to daily life.   NEXT MD VISIT:  OBJECTIVE:  (Measures in this section from initial evaluation unless otherwise noted) PATIENT SURVEYS:  N/a due to interpreter services SENSATION: WFL EDEMA:  Circumferential: 39cm on L and 37 on R Bruising along lateral proximal thigh Incision viewed with no drainage noted POSTURE:  maintains L knee flexion in standing PALPATION: Tender to palpation in lateral quad and along knee LOWER EXTREMITY ROM:  Active ROM Right eval Left eval Left 07/14/22 Left 08/03/22 Left 08/05/22  Knee flexion 115 98 120 120 123  Knee extension 0 10 Long arc quad extensor lag 10  Long arc quad extensor lag 4 extensor lag 4 extensor lag supine  LOWER EXTREMITY MMT:  MMT Right eval Left eval Left 08/05/22  Knee flexion  3/5 4+/5  Knee extension  3/5 4+/5  Ankle dorsiflexion     Ankle plantarflexion     Ankle inversion     Ankle eversion      (Blank rows = not tested) FUNCTIONAL TESTS:  5 times sit to stand: 24.08 seconds 10 meter walk test: 1.96 ft/sec gait speed GAIT: Distance walked: 100 ft Assistive device utilized: Environmental consultant - 4  wheeled Level of assistance: Modified independence Comments: dec'd L knee extension and dec'd heel strike Stairs mod indep with 2 rails step to pattern  TODAY'S TREATMENT: [facility] Adult PT Treatment:                                                DATE: 08/05/2022 Therapeutic Exercise: NuStep L6 x 5 min Knee AROM flex & ext measurements (see above) Knee MMT (see above) AAROM heel slides x10 Resisted supine hip extension orange super band Stair navigation + 1 HHA on rail Walking (no AD) x 400'  [facility] Adult PT Treatment:                                                DATE: 08/03/2022 Therapeutic Exercise: NuStep L7 x  Walking with no AD Prone passive TKE stretch (LLLD) 5#AW x AAROM heel slides 10x10" Knee extension stretch on towel roll 10x5" Bridges + hip abd 2x10 seated LAQ 3#AW 2x10 S/L resisted clamshells RTB 2x10 S/L hip ext + abd/IR x12 Seated LAQ 3#AW 2x10 Modalities: Game Ready, 34 degrees, low compression x 10 min   [facility] Adult PT Treatment:                                                DATE: 07/27/2022 Therapeutic Exercise: Recumbent bike x 5 minutes partial revolutions + intake subjecitve AAROM heel slides w/strap 10x5" TKE measurement (-10 extensor lag) Prone passive TKE stretch - LLLD 4#AW x Prone passive quad stretch  Seated LAQ (L) 5#AW --> no weight (anterior hip discomfort) 2x10x5" Side stepping RTB above knees --> crossed at ankles Squats --> added heel raises in standing Walking with SPC Modalities: Game Ready, 34 degrees, low compression x 10 min   PATIENT EDUCATION:  Education details: Self-monitoring exertion and mobility within comfortable range to decrease over exertion tendencies. Person educated: Patient Education method: Explanation, Demonstration, and Handouts Education comprehension: verbalized understanding and returned demonstration  HOME EXERCISE PROGRAM: Access Code: [email or web address] URL: [email or web address] Date:  08/05/2022 Prepared by: Carlynn Herald  Exercises - Quad Setting and Stretching  - 2 x daily - 7 x weekly - 1 sets - 10 reps - Supine Heel Slide  - 2 x daily - 7 x weekly - 1 sets - 10 reps - Seated Knee Flexion Extension AROM   - 2 x daily - 7 x weekly - 1 sets - 10 reps - Standing Hip Abduction Kicks  - 1 x daily - 7 x weekly - 3 sets - 10 reps - Clam with Resistance  - 1 x daily - 7 x weekly - 3 sets - 10 reps - Bridge with Hip Abduction and Resistance  - 1 x daily - 7 x weekly - 3 sets - 10 reps - Supine Knee Extension Stretch on Towel Roll  - 1 x daily - 7 x weekly - 3 sets - 10 reps - Seated Long Arc Quad with Ankle Weight  - 1 x daily - 7 x weekly - 3 sets - 10 reps - Prone Knee Extension with Ankle Weight  - 1 x daily - 7 x weekly - 3 sets - 10 reps - Seated Long Arc Quad with Ankle Weight  - 1 x daily - 7 x weekly - 3 sets - 10 reps - Prone Hamstring Curl with Ankle Weight  - 1 x daily - 7 x weekly - 3 sets - 10 reps - Sidelying Hip Adduction with Ankle Weight - Leg In Front  - 1 x daily - 7 x weekly - 3 sets - 10 reps - Supine Hip Flexion with Ankle Weight  - 1 x daily - 7 x weekly - 3 sets - 10 reps - Standing Hip Extension with Ankle Weight  - 1 x daily - 7 x weekly - 3 sets - 10 reps - Standing Hip Abduction with Ankle Weight  - 1 x daily - 7 x weekly - 3 sets - 10 reps - Standing Knee Flexion with Ankle Weight  - 1 x daily - 7 x weekly - 3 sets - 10 reps  ASSESSMENT:  CLINICAL IMPRESSION: Good improvement with knee  strength as measured by MMT; knee AROM improved to 123 degrees and total knee extension continues to have 4 degree extensor lag. Patient able to navigate stairs safely with one hand hold assist on railing. Patient able to ambulate 400' with no AD, demonstrating improved gait pattern with heel strike and dynamic knee flexion on swing phase.   OBJECTIVE IMPAIRMENTS: Abnormal gait, decreased activity tolerance, decreased balance, decreased mobility, difficulty walking,  decreased ROM, decreased strength, increased edema, impaired flexibility, and pain.   GOALS: Goals reviewed with patient? Yes   SHORT TERM GOALS: Target date: 08/03/2022    Independent with initial HEP. Baseline: initiated today Goal status: ACHIEVED 07/20/22  2.  Reduce pain to < or equal to 4/10. Baseline: 8/10 Goal status: ACHIEVED 07/20/22  LONG TERM GOALS: Target date: 08/31/2022   Independent with advanced/ongoing HEP to improve outcomes and carryover.  Goal status: MET  2.  Patient will demonstrate Left knee flexion to 115 deg to ascend/descend stairs. Goal status: MET  3.  Patient will demonstrate full left knee extension for safety with gait. Baseline: 10 degrees Goal status: IN PROGRESS -4 degrees  4.  Patient will be able to ambulate 400' safely with LRAD and normal gait pattern to access community.  Goal status: MET  5.  Patient will be able to ascend/descend stairs with 1 HR and reciprocal step pattern safely to access home and community.  Goal status: MET  6.  Patient will report no pain L knee. Baseline: 8/10 Goal status: IN PROGRESS 5/10 pain  PLAN:  PT FREQUENCY: 2x/week  PT DURATION: 8 weeks  PLANNED INTERVENTIONS: Therapeutic exercises, Therapeutic activity, Neuromuscular re-education, Balance training, Gait training, Patient/Family education, Self Care, Joint mobilization, Stair training, DME instructions, Dry Needling, Electrical stimulation, Cryotherapy, Vasopneumatic device, and Manual therapy  PLAN FOR NEXT SESSION: Discharge   WEAVER,CHRISTINA, PT   Sanjuana Mae, PTA 08/05/2022, 10:54 AM

## 2022-08-10 ENCOUNTER — Ambulatory Visit: Payer: 59 | Admitting: Rehabilitative and Restorative Service Providers"

## 2022-08-12 ENCOUNTER — Ambulatory Visit: Payer: 59 | Admitting: Rehabilitative and Restorative Service Providers"

## 2022-08-30 ENCOUNTER — Other Ambulatory Visit (INDEPENDENT_AMBULATORY_CARE_PROVIDER_SITE_OTHER): Payer: 59

## 2022-08-30 DIAGNOSIS — E781 Pure hyperglyceridemia: Secondary | ICD-10-CM | POA: Diagnosis not present

## 2022-08-30 DIAGNOSIS — D649 Anemia, unspecified: Secondary | ICD-10-CM | POA: Diagnosis not present

## 2022-08-30 LAB — COMPREHENSIVE METABOLIC PANEL
ALT: 5 U/L (ref 0–35)
AST: 13 U/L (ref 0–37)
Albumin: 4.2 g/dL (ref 3.5–5.2)
Alkaline Phosphatase: 46 U/L (ref 39–117)
BUN: 15 mg/dL (ref 6–23)
CO2: 30 mEq/L (ref 19–32)
Calcium: 8.8 mg/dL (ref 8.4–10.5)
Chloride: 104 mEq/L (ref 96–112)
Creatinine, Ser: 0.87 mg/dL (ref 0.40–1.20)
GFR: 67.26 mL/min (ref >60.00)
Glucose, Bld: 105 mg/dL — ABNORMAL HIGH (ref 70–99)
Potassium: 4.3 mEq/L (ref 3.5–5.1)
Sodium: 142 mEq/L (ref 135–145)
Total Bilirubin: 0.3 mg/dL (ref 0.2–1.2)
Total Protein: 6.5 g/dL (ref 6.0–8.3)

## 2022-08-30 LAB — LIPID PANEL
Cholesterol: 155 mg/dL (ref 0–200)
HDL: 57 mg/dL (ref >39.00)
LDL Cholesterol: 71 mg/dL (ref 0–99)
NonHDL: 98.17
Total CHOL/HDL Ratio: 3
Triglycerides: 138 mg/dL (ref 0.0–149.0)
VLDL: 27.6 mg/dL (ref 0.0–40.0)

## 2022-08-30 LAB — CBC
HCT: 35.3 % — ABNORMAL LOW (ref 36.0–46.0)
Hemoglobin: 11.8 g/dL — ABNORMAL LOW (ref 12.0–15.0)
MCHC: 33.3 g/dL (ref 30.0–36.0)
MCV: 90.2 fl (ref 78.0–100.0)
Platelets: 246 10*3/uL (ref 150.0–400.0)
RBC: 3.92 Mil/uL (ref 3.87–5.11)
RDW: 13.4 % (ref 11.5–15.5)
WBC: 3.5 10*3/uL — ABNORMAL LOW (ref 4.0–10.5)

## 2022-09-02 ENCOUNTER — Other Ambulatory Visit: Payer: Self-pay | Admitting: Family Medicine

## 2022-09-02 DIAGNOSIS — F324 Major depressive disorder, single episode, in partial remission: Secondary | ICD-10-CM

## 2022-09-06 ENCOUNTER — Encounter: Payer: Self-pay | Admitting: Family Medicine

## 2022-09-06 ENCOUNTER — Telehealth: Payer: Self-pay

## 2022-09-06 ENCOUNTER — Ambulatory Visit (INDEPENDENT_AMBULATORY_CARE_PROVIDER_SITE_OTHER): Payer: 59 | Admitting: Family Medicine

## 2022-09-06 ENCOUNTER — Telehealth: Payer: Self-pay | Admitting: Family Medicine

## 2022-09-06 ENCOUNTER — Other Ambulatory Visit (HOSPITAL_COMMUNITY): Payer: Self-pay

## 2022-09-06 VITALS — BP 134/76 | HR 69 | Temp 98.0°F | Ht 61.5 in | Wt 135.0 lb

## 2022-09-06 DIAGNOSIS — F324 Major depressive disorder, single episode, in partial remission: Secondary | ICD-10-CM | POA: Diagnosis not present

## 2022-09-06 DIAGNOSIS — I10 Essential (primary) hypertension: Secondary | ICD-10-CM

## 2022-09-06 DIAGNOSIS — Z1231 Encounter for screening mammogram for malignant neoplasm of breast: Secondary | ICD-10-CM | POA: Diagnosis not present

## 2022-09-06 DIAGNOSIS — Z0000 Encounter for general adult medical examination without abnormal findings: Secondary | ICD-10-CM

## 2022-09-06 DIAGNOSIS — M810 Age-related osteoporosis without current pathological fracture: Secondary | ICD-10-CM | POA: Diagnosis not present

## 2022-09-06 MED ORDER — TRAZODONE HCL 100 MG PO TABS: 100.0000 mg | ORAL_TABLET | Freq: Every day | ORAL | 3 refills | Status: DC

## 2022-09-06 NOTE — Patient Instructions (Addendum)
Keep the diet clean and stay active.  Please consider getting the tetanus booster at the pharmacy.   Please get me a copy of your advanced directive form at your convenience.   Let us know if you need anything.

## 2022-09-06 NOTE — Telephone Encounter (Signed)
Prolia VOB initiated via [email or web address] 

## 2022-09-06 NOTE — Telephone Encounter (Signed)
Last Prolia 04/13/2022. Patient due next month/June 2024. Please notify patient once prior authorization completed.

## 2022-09-06 NOTE — Telephone Encounter (Signed)
Created new encounter for Prolia BIV. Will route encounter back once benefit verification is complete. 

## 2022-09-06 NOTE — Progress Notes (Signed)
Chief Complaint  Patient presents with   Annual Exam    Shoulder, back and knee pain Refill Trazodone, hydrocodone      Well Woman Amber Mccall is here for a complete physical.   Her last physical was >1 year ago. Son here who helps interpret ([place]).  Current diet: in general, a "healthy" diet. Current exercise: active in house. Weight is stable and she denies daytime fatigue. Seatbelt? Yes Advanced directive? No  Health Maintenance Colonoscopy- Yes Shingrix- Yes DEXA- Due Mammogram- Due Tetanus- Due Pneumonia- Yes Hep C screen- Yes  Past Medical History:  Diagnosis Date   Anginal pain (HCC)    pt says she feels like this may be related to acid reflux   Closed fracture of metatarsal bone(s) 01/30/2013   Depression    Essential hypertension    GERD (gastroesophageal reflux disease)    Lumbar radiculopathy    Osteoarthritis    Osteoporosis      Past Surgical History:  Procedure Laterality Date   ABDOMINAL HYSTERECTOMY     APPENDECTOMY     CATARACT EXTRACTION Bilateral 2022   Cotton Osteotomy w/ Bone Graft Left 04/16/2015   Left foot   IR RADIOLOGIST EVAL & MGMT  04/11/2017   OPEN REDUCTION INTERNAL FIXATION (ORIF) DISTAL PHALANX Left 10/23/2014   5th Metatarsal   TOTAL KNEE ARTHROPLASTY Right 10/18/2021   Procedure: RIGHT TOTAL KNEE ARTHROPLASTY;  Surgeon: Tarry Kos, MD;  Location: [facility] OR;  Service: Orthopedics;  Laterality: Right;   TOTAL KNEE ARTHROPLASTY Left 06/20/2022   Procedure: LEFT TOTAL KNEE REPLACEMENT;  Surgeon: Tarry Kos, MD;  Location: [facility] OR;  Service: Orthopedics;  Laterality: Left;    Medications  Current Outpatient Medications on File Prior to Visit  Medication Sig Dispense Refill   amLODipine (NORVASC) 10 MG tablet Take 10 mg by mouth daily.     calcium citrate (CALCITRATE - DOSED IN MG ELEMENTAL CALCIUM) 950 (200 Ca) MG tablet Take 950 mg by mouth daily.     diclofenac Sodium (VOLTAREN) 1 % GEL Apply 1 Application topically as needed  (pain).     docusate sodium (COLACE) 100 MG capsule Take 1 capsule (100 mg total) by mouth daily as needed. 30 capsule 2   gabapentin (NEURONTIN) 100 MG capsule Take 100 mg by mouth daily.     HYDROcodone-acetaminophen (NORCO) 5-325 MG tablet Take 1 tablet by mouth 3 (three) times daily as needed. 30 tablet 0   HYDROmorphone (DILAUDID) 2 MG tablet Take 1 tablet (2 mg total) by mouth every 6 (six) hours as needed for severe pain. 30 tablet 0   methocarbamol (ROBAXIN-750) 750 MG tablet Take 1 tablet (750 mg total) by mouth 2 (two) times daily as needed for muscle spasms. 20 tablet 2   ondansetron (ZOFRAN) 4 MG tablet Take 1 tablet (4 mg total) by mouth every 8 (eight) hours as needed for nausea or vomiting. 40 tablet 0   oxyCODONE-acetaminophen (PERCOCET) 5-325 MG tablet Take 1-2 tablets by mouth 2 (two) times daily as needed. To be taken after surgery 30 tablet 0   pantoprazole (PROTONIX) 40 MG tablet Take 1 tablet (40 mg total) by mouth daily. 90 tablet 11   promethazine (PHENERGAN) 12.5 MG tablet Take 1 tablet (12.5 mg total) by mouth every 6 (six) hours as needed for nausea or vomiting. 30 tablet 0   rivaroxaban (XARELTO) 10 MG TABS tablet Take 1 tablet (10 mg total) by mouth daily. To be taken after surgery (instead of aspirin) to  prevent blood clots 28 tablet 0   sertraline (ZOLOFT) 50 MG tablet TAKE 1 TABLET(50 MG) BY MOUTH DAILY 90 tablet 2   VITAMIN D, CHOLECALCIFEROL, PO Take 1 capsule by mouth daily.      Allergies No Known Allergies  Review of Systems: Constitutional:  no fevers Eye:  no recent significant change in vision Ears:  No changes in hearing Nose/Mouth/Throat:  no complaints of nasal congestion, no sore throat Cardiovascular: no chest pain Respiratory:  No shortness of breath Gastrointestinal:  No change in bowel habits GU:  Female: +frequency Integumentary:  no abnormal skin lesions reported Neurologic:  no headaches Endocrine:  denies unexplained weight  changes  Exam BP 134/76 (BP Location: Left Arm, Cuff Size: Normal)   Pulse 69   Temp 98 F (36.7 C) (Oral)   Ht 5' 1.5" (1.562 m)   Wt 135 lb (61.2 kg)   SpO2 97%   BMI 25.09 kg/m  General:  well developed, well nourished, in no apparent distress Skin:  no significant moles, warts, or growths Head:  no masses, lesions, or tenderness Eyes:  pupils equal and round, sclera anicteric without injection Ears:  canals without lesions, TMs shiny without retraction, no obvious effusion, no erythema Nose:  nares patent, mucosa normal, and no drainage Throat/Pharynx:  lips and gingiva without lesion; tongue and uvula midline; non-inflamed pharynx; no exudates or postnasal drainage Neck: neck supple without adenopathy, thyromegaly, or masses Lungs:  clear to auscultation, breath sounds equal bilaterally, no respiratory distress Cardio:  regular rate and rhythm, no bruits or LE edema Abdomen:  abdomen soft, nontender; bowel sounds normal; no masses or organomegaly Genital: Deferred Neuro:  gait normal; deep tendon reflexes normal and symmetric Psych: well oriented with normal range of affect and appropriate judgment/insight  Assessment and Plan  Well adult exam  Essential hypertension  Age-related osteoporosis without current pathological fracture - Plan: DG Bone Density  Depression, major, single episode, in partial remission (HCC) - Plan: traZODone (DESYREL) 100 MG tablet  Encounter for screening mammogram for malignant neoplasm of breast - Plan: MM DIGITAL SCREENING BILATERAL   Well 72 y.o. female. Counseled on diet and exercise. Other orders as above. Mammogram and bone density scan ordered today. Advanced directive form provided today.  Tdap booster recommended, she will get at the pharmacy. Follow up in 6 months. The patient and her son voiced understanding and agreement to the plan.  Amber Roche Weston, DO 09/06/22 11:19 AM

## 2022-09-06 NOTE — Telephone Encounter (Signed)
Need to run benefits

## 2022-09-07 DIAGNOSIS — H02831 Dermatochalasis of right upper eyelid: Secondary | ICD-10-CM | POA: Diagnosis not present

## 2022-09-07 DIAGNOSIS — H02834 Dermatochalasis of left upper eyelid: Secondary | ICD-10-CM | POA: Diagnosis not present

## 2022-09-07 DIAGNOSIS — H02403 Unspecified ptosis of bilateral eyelids: Secondary | ICD-10-CM | POA: Diagnosis not present

## 2022-09-08 ENCOUNTER — Other Ambulatory Visit (HOSPITAL_COMMUNITY): Payer: Self-pay

## 2022-09-08 NOTE — Telephone Encounter (Addendum)
Pt ready for scheduling for PROLIA on or after : 10/11/22  Out-of-pocket cost due at time of visit: $327  Primary: [facility] MEDICARE Prolia co-insurance: 20% Admin fee co-insurance: 20%  Secondary: --- Prolia co-insurance:  Admin fee co-insurance:   Medical Benefit Details: Date Benefits were checked: 09/07/22 Deductible: $240 met of $240 required/ Coinsurance: 20%/ Admin Fee: 20%  Prior Auth: APPROVED PA# [id number]  Expiration Date: 09/08/2023   Pharmacy benefit: Copay $0 If patient wants fill through the pharmacy benefit please send prescription to: [facility], and include estimated need by date in rx notes. Pharmacy will ship medication directly to the office.  Patient not eligible for Prolia Copay Card. Copay Card can make patient's cost as little as $25. Link to apply: [email or web address]  ** This summary of benefits is an estimation of the patient's out-of-pocket cost. Exact cost may very based on individual plan coverage.

## 2022-09-08 NOTE — Telephone Encounter (Signed)
Authorization Number: [id number] 09/08/2022-09/08/2023

## 2022-09-09 MED ORDER — DENOSUMAB 60 MG/ML ~~LOC~~ SOSY: 60.0000 mg | PREFILLED_SYRINGE | Freq: Once | SUBCUTANEOUS | 0 refills | Status: DC

## 2022-09-09 NOTE — Telephone Encounter (Signed)
Patient scheduled for 10/14/22 at 9am and rx sent to [facility].

## 2022-09-23 ENCOUNTER — Other Ambulatory Visit: Payer: Self-pay | Admitting: Physician Assistant

## 2022-10-11 ENCOUNTER — Telehealth: Payer: Self-pay | Admitting: *Deleted

## 2022-10-11 NOTE — Telephone Encounter (Signed)
Spoke with [facility], Misty Stanley and they are waiting on patients consent so they can get med sent out.  She had to leave message for them to call back.

## 2022-10-11 NOTE — Telephone Encounter (Signed)
[facility] has called to confirm delivery for prolia will be Thursday and requires a signature

## 2022-10-11 NOTE — Telephone Encounter (Signed)
Spoke with Myriam Jacobson as she was in office and she will call [facility] back today.

## 2022-10-12 ENCOUNTER — Ambulatory Visit (INDEPENDENT_AMBULATORY_CARE_PROVIDER_SITE_OTHER): Payer: 59 | Admitting: Orthopaedic Surgery

## 2022-10-12 DIAGNOSIS — Z96652 Presence of left artificial knee joint: Secondary | ICD-10-CM

## 2022-10-12 MED ORDER — TRAMADOL HCL 50 MG PO TABS: 50.0000 mg | ORAL_TABLET | Freq: Every day | ORAL | 0 refills | Status: DC | PRN

## 2022-10-12 MED ORDER — LIDOCAINE 4 % EX PTCH: 1.0000 | MEDICATED_PATCH | CUTANEOUS | 6 refills | Status: AC

## 2022-10-12 NOTE — Progress Notes (Signed)
Post-Op Visit Note   Patient: Amber Mccall           Date of Birth: [birth year]           MRN: [id number] Visit Date: 10/12/2022 PCP: Sharlene Dory, DO   Assessment & Plan:  Chief Complaint:  Chief Complaint  Patient presents with   Left Knee - Routine Post Op   Visit Diagnoses:  1. Status post total left knee replacement     Plan: Patient is now 44-month status post left total knee replacement.  She is here for postop check.  Her son is present as well.  She is overall very happy and has already completed physical therapy.  She reports seeing medial sided knee pain that stings and throbs at times.  Otherwise she is great.  Examination of left knee shows fully healed surgical scar.  Range of motion is 2 to 123 degrees.  Collaterals are stable.  No signs of infection.  She is ambulating well.  Very happy with her recovery.  I am going to send in a refill on the tramadol and a lidocaine patch for her shoulder.  We will see her back in 3 months with two-view x-rays of the left knee.  Follow-Up Instructions: Return in about 3 months (around 01/12/2023).   Orders:  No orders of the defined types were placed in this encounter.  Meds ordered this encounter  Medications   lidocaine 4 %    Sig: Place 1 patch onto the skin daily.    Dispense:  5 patch    Refill:  6   traMADol (ULTRAM) 50 MG tablet    Sig: Take 1-2 tablets (50-100 mg total) by mouth daily as needed.    Dispense:  20 tablet    Refill:  0    Imaging: No results found.  PMFS History: Patient Active Problem List   Diagnosis Date Noted   Status post total left knee replacement 06/20/2022   Status post total right knee replacement 10/18/2021   Primary osteoarthritis of right knee 10/17/2021   Spondylosis without myelopathy or radiculopathy, lumbar region 09/28/2021   Chronic right shoulder pain 08/18/2021   Adhesive capsulitis of right shoulder 08/18/2021   Gastroesophageal reflux disease 08/18/2021    Primary osteoarthritis of left knee 07/27/2021   Primary osteoarthritis involving multiple joints 07/13/2021   Strain of right trapezius muscle 07/13/2021   Essential hypertension 05/19/2021   Hypertriglyceridemia 05/19/2021   Osteoporosis 05/19/2021   Lumbar radiculopathy 05/19/2021   Osteoarthritis 05/19/2021   Depression, major, single episode, in partial remission (HCC) 05/19/2021   Chronic right-sided low back pain 05/19/2021   Status post left foot surgery 04/23/2015   Injury of foot, left 04/08/2015   Sprain of ankle 04/08/2015   Deformity of metatarsal bone of left foot 04/08/2015   Metatarsalgia of left foot 02/10/2015   Tenosynovitis of foot 02/10/2015   Chronic pain of both knees 02/10/2015   Closed fracture of metatarsal bone 10/21/2014   Edema of left foot 10/21/2014   Closed fracture of metatarsal bone(s) 01/30/2013   Pain, foot 01/30/2013   Abnormal foot finding 01/30/2013   Past Medical History:  Diagnosis Date   Anginal pain (HCC)    pt says she feels like this may be related to acid reflux   Closed fracture of metatarsal bone(s) 01/30/2013   Depression    Essential hypertension    GERD (gastroesophageal reflux disease)    Lumbar radiculopathy    Osteoarthritis  Osteoporosis     Family History  Problem Relation Age of Onset   Colon cancer Neg Hx    Rectal cancer Neg Hx    Stomach cancer Neg Hx     Past Surgical History:  Procedure Laterality Date   ABDOMINAL HYSTERECTOMY     APPENDECTOMY     CATARACT EXTRACTION Bilateral 2022   Cotton Osteotomy w/ Bone Graft Left 04/16/2015   Left foot   IR RADIOLOGIST EVAL & MGMT  04/11/2017   OPEN REDUCTION INTERNAL FIXATION (ORIF) DISTAL PHALANX Left 10/23/2014   5th Metatarsal   TOTAL KNEE ARTHROPLASTY Right 10/18/2021   Procedure: RIGHT TOTAL KNEE ARTHROPLASTY;  Surgeon: Tarry Kos, MD;  Location: [facility] OR;  Service: Orthopedics;  Laterality: Right;   TOTAL KNEE ARTHROPLASTY Left 06/20/2022   Procedure: LEFT  TOTAL KNEE REPLACEMENT;  Surgeon: Tarry Kos, MD;  Location: [facility] OR;  Service: Orthopedics;  Laterality: Left;   Social History   Occupational History   Occupation: retired  Tobacco Use   Smoking status: Never   Smokeless tobacco: Never  Vaping Use   Vaping Use: Never used  Substance and Sexual Activity   Alcohol use: Not Currently   Drug use: Not Currently   Sexual activity: Not on file

## 2022-10-13 NOTE — Telephone Encounter (Signed)
Prolia received today , placed in fridge

## 2022-10-14 ENCOUNTER — Other Ambulatory Visit (HOSPITAL_BASED_OUTPATIENT_CLINIC_OR_DEPARTMENT_OTHER): Payer: Self-pay

## 2022-10-14 ENCOUNTER — Telehealth: Payer: Self-pay

## 2022-10-14 ENCOUNTER — Ambulatory Visit (INDEPENDENT_AMBULATORY_CARE_PROVIDER_SITE_OTHER): Payer: 59

## 2022-10-14 ENCOUNTER — Encounter: Payer: Self-pay | Admitting: Family Medicine

## 2022-10-14 DIAGNOSIS — M810 Age-related osteoporosis without current pathological fracture: Secondary | ICD-10-CM | POA: Diagnosis not present

## 2022-10-14 MED ORDER — DENOSUMAB 60 MG/ML ~~LOC~~ SOSY
60.0000 mg | PREFILLED_SYRINGE | Freq: Once | SUBCUTANEOUS | Status: AC
  Administered 2022-10-14: 60 mg via SUBCUTANEOUS

## 2022-10-14 NOTE — Telephone Encounter (Signed)
Pt received Prolia 10/14/22. Due on/after 04/15/23.   Robaxin was pended already to this encounter. Dr Carmelia Roller, okay to refill the Rx?

## 2022-10-14 NOTE — Telephone Encounter (Signed)
Pt received Prolia 10/14/22. Due on/after 04/15/23.   

## 2022-10-14 NOTE — Progress Notes (Signed)
Amber Mccall is a 72 y.o. female presents to the office today for Prolia  injection, per physician's orders. Prolia 60 mg SQ , was administered R arm today. Patient tolerated injection. Patient due for follow up labs/provider appt: No. Patient next injection due: 6 months, appt made No- will schedule in 5 months  --Patient supplied--  Creft, Feliberto Harts  DOD: Drue Novel

## 2022-10-17 ENCOUNTER — Telehealth: Payer: Self-pay | Admitting: *Deleted

## 2022-10-17 MED ORDER — AMLODIPINE BESYLATE 10 MG PO TABS: 10.0000 mg | ORAL_TABLET | Freq: Every day | ORAL | 3 refills | Status: DC

## 2022-10-17 NOTE — Telephone Encounter (Signed)
Opened in error

## 2022-11-09 ENCOUNTER — Ambulatory Visit (HOSPITAL_BASED_OUTPATIENT_CLINIC_OR_DEPARTMENT_OTHER)
Admission: RE | Admit: 2022-11-09 | Discharge: 2022-11-09 | Disposition: A | Discharge status: Home / Self Care | Payer: 59 | Source: Ambulatory Visit | Attending: Family Medicine | Admitting: Family Medicine

## 2022-11-09 ENCOUNTER — Encounter (HOSPITAL_BASED_OUTPATIENT_CLINIC_OR_DEPARTMENT_OTHER): Payer: Self-pay

## 2022-11-09 DIAGNOSIS — M810 Age-related osteoporosis without current pathological fracture: Secondary | ICD-10-CM | POA: Diagnosis not present

## 2022-11-09 DIAGNOSIS — Z1231 Encounter for screening mammogram for malignant neoplasm of breast: Secondary | ICD-10-CM | POA: Diagnosis not present

## 2022-12-25 ENCOUNTER — Other Ambulatory Visit: Payer: Self-pay | Admitting: Gastroenterology

## 2023-01-12 ENCOUNTER — Other Ambulatory Visit (INDEPENDENT_AMBULATORY_CARE_PROVIDER_SITE_OTHER): Payer: 59

## 2023-01-12 ENCOUNTER — Ambulatory Visit (INDEPENDENT_AMBULATORY_CARE_PROVIDER_SITE_OTHER): Payer: 59 | Admitting: Orthopaedic Surgery

## 2023-01-12 DIAGNOSIS — S46811A Strain of other muscles, fascia and tendons at shoulder and upper arm level, right arm, initial encounter: Secondary | ICD-10-CM

## 2023-01-12 DIAGNOSIS — Z96652 Presence of left artificial knee joint: Secondary | ICD-10-CM

## 2023-01-12 MED ORDER — TRAMADOL HCL 50 MG PO TABS: 50.0000 mg | ORAL_TABLET | Freq: Every day | ORAL | 0 refills | Status: DC | PRN

## 2023-01-12 NOTE — Progress Notes (Signed)
d  Office Visit Note   Patient: Amber Mccall           Date of Birth: [birth year]           MRN: [id number] Visit Date: 01/12/2023              Requested by: Sharlene Dory, DO [address] [place],  [place] [address] PCP: Sharlene Dory, DO   Assessment & Plan: Visit Diagnoses:  1. Status post total left knee replacement   2. Strain of right trapezius muscle, initial encounter     Plan: Patient is 6 months status post left total knee replacement and doing very well.  Recheck in 6 months.  Dental prophylaxis reinforced.  For the right trapezius I recommended massage therapist and continued home exercises and heat.  Patient is not interested in going back to physical therapy.  Tramadol prescribed for breakthrough pain.  Language barrier increased complexity of the visit today.  Follow-Up Instructions: Return in about 6 months (around 07/12/2023).   Orders:  Orders Placed This Encounter  Procedures   XR Knee 1-2 Views Left   Meds ordered this encounter  Medications   traMADol (ULTRAM) 50 MG tablet    Sig: Take 1-2 tablets (50-100 mg total) by mouth daily as needed.    Dispense:  20 tablet    Refill:  0      Procedures: No procedures performed   Clinical Data: No additional findings.   Subjective: Chief Complaint  Patient presents with   Left Knee - Follow-up    HPI Patient is 6 months postop from left total knee replacement.  Overall doing well.  Reports some stiffness at night.  Other issue is continued right trapezius pain.  States that physical therapy in the past made it worse. Review of Systems   Objective: Vital Signs: There were no vitals taken for this visit.  Physical Exam  Ortho Exam Examination of the left knee shows fully healed surgical scar.  Excellent range of motion.  Normal gait pattern. Examination of the right shoulder shows tenderness diffusely in the right trapezius muscle belly. Specialty Comments:  No  specialty comments available.  Imaging: XR Knee 1-2 Views Left  Result Date: 01/12/2023 X-rays of the left knee show a stable left total knee replacement in good alignment.     PMFS History: Patient Active Problem List   Diagnosis Date Noted   Status post total left knee replacement 06/20/2022   Status post total right knee replacement 10/18/2021   Primary osteoarthritis of right knee 10/17/2021   Spondylosis without myelopathy or radiculopathy, lumbar region 09/28/2021   Chronic right shoulder pain 08/18/2021   Adhesive capsulitis of right shoulder 08/18/2021   Gastroesophageal reflux disease 08/18/2021   Primary osteoarthritis of left knee 07/27/2021   Primary osteoarthritis involving multiple joints 07/13/2021   Strain of right trapezius muscle 07/13/2021   Essential hypertension 05/19/2021   Hypertriglyceridemia 05/19/2021   Osteoporosis 05/19/2021   Lumbar radiculopathy 05/19/2021   Osteoarthritis 05/19/2021   Depression, major, single episode, in partial remission (HCC) 05/19/2021   Chronic right-sided low back pain 05/19/2021   Status post left foot surgery 04/23/2015   Injury of foot, left 04/08/2015   Sprain of ankle 04/08/2015   Deformity of metatarsal bone of left foot 04/08/2015   Metatarsalgia of left foot 02/10/2015   Tenosynovitis of foot 02/10/2015   Chronic pain of both knees 02/10/2015   Closed fracture of metatarsal bone 10/21/2014  Edema of left foot 10/21/2014   Closed fracture of metatarsal bone(s) 01/30/2013   Pain, foot 01/30/2013   Abnormal foot finding 01/30/2013   Past Medical History:  Diagnosis Date   Anginal pain (HCC)    pt says she feels like this may be related to acid reflux   Closed fracture of metatarsal bone(s) 01/30/2013   Depression    Essential hypertension    GERD (gastroesophageal reflux disease)    Lumbar radiculopathy    Osteoarthritis    Osteoporosis     Family History  Problem Relation Age of Onset   Colon cancer  Neg Hx    Rectal cancer Neg Hx    Stomach cancer Neg Hx     Past Surgical History:  Procedure Laterality Date   ABDOMINAL HYSTERECTOMY     APPENDECTOMY     CATARACT EXTRACTION Bilateral 2022   Cotton Osteotomy w/ Bone Graft Left 04/16/2015   Left foot   IR RADIOLOGIST EVAL & MGMT  04/11/2017   OPEN REDUCTION INTERNAL FIXATION (ORIF) DISTAL PHALANX Left 10/23/2014   5th Metatarsal   TOTAL KNEE ARTHROPLASTY Right 10/18/2021   Procedure: RIGHT TOTAL KNEE ARTHROPLASTY;  Surgeon: Tarry Kos, MD;  Location: [facility] OR;  Service: Orthopedics;  Laterality: Right;   TOTAL KNEE ARTHROPLASTY Left 06/20/2022   Procedure: LEFT TOTAL KNEE REPLACEMENT;  Surgeon: Tarry Kos, MD;  Location: [facility] OR;  Service: Orthopedics;  Laterality: Left;   Social History   Occupational History   Occupation: retired  Tobacco Use   Smoking status: Never   Smokeless tobacco: Never  Vaping Use   Vaping status: Never Used  Substance and Sexual Activity   Alcohol use: Not Currently   Drug use: Not Currently   Sexual activity: Not on file

## 2023-03-10 ENCOUNTER — Ambulatory Visit: Payer: 59 | Admitting: Family Medicine

## 2023-03-14 ENCOUNTER — Encounter: Payer: Self-pay | Admitting: Family Medicine

## 2023-03-14 ENCOUNTER — Ambulatory Visit (INDEPENDENT_AMBULATORY_CARE_PROVIDER_SITE_OTHER): Payer: 59 | Admitting: Family Medicine

## 2023-03-14 VITALS — BP 132/78 | HR 102 | Temp 97.5°F | Resp 16 | Ht 61.0 in | Wt 135.0 lb

## 2023-03-14 DIAGNOSIS — F324 Major depressive disorder, single episode, in partial remission: Secondary | ICD-10-CM

## 2023-03-14 DIAGNOSIS — R079 Chest pain, unspecified: Secondary | ICD-10-CM | POA: Diagnosis not present

## 2023-03-14 DIAGNOSIS — I10 Essential (primary) hypertension: Secondary | ICD-10-CM

## 2023-03-14 DIAGNOSIS — R195 Other fecal abnormalities: Secondary | ICD-10-CM

## 2023-03-14 MED ORDER — SERTRALINE HCL 50 MG PO TABS
50.0000 mg | ORAL_TABLET | Freq: Every day | ORAL | 2 refills | Status: DC
Start: 2023-03-14 — End: 2023-09-12

## 2023-03-14 MED ORDER — AMLODIPINE BESYLATE 10 MG PO TABS: 10.0000 mg | ORAL_TABLET | Freq: Every day | ORAL | 3 refills | Status: DC

## 2023-03-14 MED ORDER — TRAZODONE HCL 100 MG PO TABS
100.0000 mg | ORAL_TABLET | Freq: Every day | ORAL | 3 refills | Status: DC
Start: 2023-03-14 — End: 2023-09-04

## 2023-03-14 MED ORDER — GABAPENTIN 100 MG PO CAPS: 100.0000 mg | ORAL_CAPSULE | Freq: Every day | ORAL | 2 refills | Status: AC

## 2023-03-14 NOTE — Progress Notes (Signed)
Chief Complaint  Patient presents with   Follow-up    Follow up    Subjective Amber Mccall is a 72 y.o. female who presents for hypertension follow up.  She is here with her son who helps interpret ([place]). She does monitor home blood pressures. Blood pressures ranging from 120-130's/70-80's on average. She is compliant with medication- Norvasc 5 mg/d. Patient has these side effects of medication: none She is adhering to a healthy diet overall. Current exercise: walking +CP or SOB- not exertional; happens twice per week.  Lasts for a few minutes, goes away randomly, pushing on it helps. Does not feel like indigestion. No jaw, back, arm pain a/w this.   Depression Pt is currently being treated with Zoloft 50 mg/d. She takes trazodone  Reports doing well since treatment. No thoughts of harming self or others. No self-medication with alcohol, prescription drugs or illicit drugs. Pt is not following with a counselor/psychologist.  Diarrhea Over the past few months, she has had intermittent diarrhea.  It comes on average every 3 to 4 days.  No dietary associations.  She does have high stress levels.  There is no bleeding, dark tarry stools, nausea, vomiting, or abdominal pain.  No close contacts were been sick.  No recent antibiotic usage.   Past Medical History:  Diagnosis Date   Anginal pain (HCC)    pt says she feels like this may be related to acid reflux   Closed fracture of metatarsal bone(s) 01/30/2013   Depression    Essential hypertension    GERD (gastroesophageal reflux disease)    Lumbar radiculopathy    Osteoarthritis    Osteoporosis     Exam BP 132/78 (BP Location: Left Arm, Patient Position: Sitting, Cuff Size: Normal)   Pulse (!) 102   Temp (!) 97.5 F (36.4 C) (Oral)   Resp 16   Ht 5\' 1"  (1.549 m)   Wt 135 lb (61.2 kg)   SpO2 96%   BMI 25.51 kg/m  General:  well developed, well nourished, in no apparent distress Heart: RRR, no bruits, no LE edema Lungs:  clear to auscultation, no accessory muscle use Abdomen: Bowel sounds present, soft, nontender, nondistended Psych: well oriented with normal range of affect and appropriate judgment/insight  Essential hypertension - Plan: EKG 12-Lead  Depression, major, single episode, in partial remission (HCC) - Plan: sertraline (ZOLOFT) 50 MG tablet, traZODone (DESYREL) 100 MG tablet  Chest pain, unspecified type - Plan: EKG 12-Lead  Loose stools  Chronic, stable.  Continue Norvasc 5 mg daily.  Counseled on diet and exercise. Chronic, stable.  Continue Zoloft 50 mg daily, trazodone 100 mg nightly as needed. Atypical chest pain today.  EKG is unremarkable/unchanged from recent. Try some Metamucil.  Monitor stress levels in association with her episodes.  She is up-to-date with colon cancer screening.  She will let me know in the next month or so if she is not improving.  Could consider changing Zoloft to amitriptyline. F/u in 6 months for physical otherwise. The patient and her son voiced understanding and agreement to the plan.  Jilda Roche [place], DO 03/14/23  9:06 AM

## 2023-03-14 NOTE — Patient Instructions (Addendum)
Keep the diet clean and stay active.  Please consider getting your tetanus shot at your pharmacy.   Keep an eye on your stress levels in response to the diarrhea.  Take Metamucil or Benefiber daily.  Let us know if you need anything.

## 2023-03-26 ENCOUNTER — Other Ambulatory Visit: Payer: Self-pay | Admitting: Gastroenterology

## 2023-03-27 ENCOUNTER — Other Ambulatory Visit: Payer: Self-pay | Admitting: Family Medicine

## 2023-04-10 NOTE — Telephone Encounter (Signed)
Pt's son called to schedule prolia. Please verify pt is due and okay for scheduling. Please call Soom Men back at [phone] to advise.

## 2023-04-11 ENCOUNTER — Telehealth: Payer: Self-pay | Admitting: *Deleted

## 2023-04-11 DIAGNOSIS — M810 Age-related osteoporosis without current pathological fracture: Secondary | ICD-10-CM

## 2023-04-11 MED ORDER — DENOSUMAB 60 MG/ML ~~LOC~~ SOSY
60.0000 mg | PREFILLED_SYRINGE | Freq: Once | SUBCUTANEOUS | Status: AC
Start: 2023-04-14 — End: 2023-04-21
  Administered 2023-04-21: 60 mg via SUBCUTANEOUS

## 2023-04-11 NOTE — Telephone Encounter (Signed)
Prolia given 10/14/22 Next Prolia due 04/16/23  CAM order placed.

## 2023-04-12 ENCOUNTER — Telehealth: Payer: Self-pay

## 2023-04-12 NOTE — Telephone Encounter (Signed)
 Prolia VOB initiated via [email or web address]  Next Prolia inj DUE: 04/16/23

## 2023-04-12 NOTE — Telephone Encounter (Signed)
Prolia BIV in separate encounter.

## 2023-04-14 ENCOUNTER — Encounter (HOSPITAL_COMMUNITY): Payer: Self-pay

## 2023-04-14 ENCOUNTER — Telehealth: Payer: Self-pay

## 2023-04-14 ENCOUNTER — Other Ambulatory Visit: Payer: Self-pay

## 2023-04-14 ENCOUNTER — Other Ambulatory Visit (HOSPITAL_COMMUNITY): Payer: Self-pay

## 2023-04-14 MED ORDER — PROLIA 60 MG/ML ~~LOC~~ SOSY
60.0000 mg | PREFILLED_SYRINGE | SUBCUTANEOUS | 0 refills | Status: DC
  Filled 2023-04-14 (×2): qty 1, 180d supply, fill #0

## 2023-04-14 NOTE — Telephone Encounter (Signed)
 Pt scheduled for 04/21/23

## 2023-04-14 NOTE — Addendum Note (Signed)
Addended by: Thelma Barge D on: 04/14/2023 03:07 PM   Modules accepted: Orders

## 2023-04-14 NOTE — Progress Notes (Signed)
Specialty Pharmacy Initial Fill Coordination Note  Amber Mccall is a 71 y.o. female contacted today regarding initial fill of specialty medication(s) Denosumab (Prolia)   Patient requested Delivery   Delivery date: 04/20/23   Verified address: [address], [address], [place]   Medication will be filled on 12/23.   Patient is aware of $0 copayment.

## 2023-04-14 NOTE — Telephone Encounter (Signed)
Copied from CRM [id number]. Topic: General - Other >> Apr 14, 2023 11:34 AM [place] A wrote: Reason for CRM: Soon Mim Sterling Big is the son of patient and he was following up to see why the office has called him back regarding his mother's Prolia injection. Please call son at [phone]

## 2023-04-14 NOTE — Telephone Encounter (Signed)
Prolia team is working on getting benefits.  Son notified.

## 2023-04-14 NOTE — Telephone Encounter (Signed)
Pt ready for scheduling for PROLIA on or after : 04/16/23  Out-of-pocket cost due at time of visit: $346  Number of injection/visits approved: 2  Primary: [employer] MEDICARE Prolia co-insurance: 20% Admin fee co-insurance: 20%  Secondary: --- Prolia co-insurance:  Admin fee co-insurance:   Medical Benefit Details: Date Benefits were checked: 04/14/23 Deductible: NO/ Coinsurance: 20%/ Admin Fee: 20%  Prior Auth: APPROVED PA# [id number] Expiration Date: 09/08/22-09/08/23  # of doses approved: 2  Pharmacy benefit: Copay $0 If patient wants fill through the pharmacy benefit please send prescription to: [facility], and include estimated need by date in rx notes. Pharmacy will ship medication directly to the office.  Patient NOT eligible for Prolia Copay Card. Copay Card can make patient's cost as little as $25. Link to apply: [email or web address]://[email or web address]  ** This summary of benefits is an estimation of the patient's out-of-pocket cost. Exact cost may very based on individual plan coverage.

## 2023-04-17 ENCOUNTER — Other Ambulatory Visit: Payer: Self-pay

## 2023-04-18 NOTE — Telephone Encounter (Signed)
Prolia received from pharmacy and placed in fridge.

## 2023-04-21 ENCOUNTER — Ambulatory Visit (INDEPENDENT_AMBULATORY_CARE_PROVIDER_SITE_OTHER): Payer: 59

## 2023-04-21 ENCOUNTER — Telehealth: Payer: Self-pay

## 2023-04-21 ENCOUNTER — Other Ambulatory Visit (HOSPITAL_BASED_OUTPATIENT_CLINIC_OR_DEPARTMENT_OTHER): Payer: Self-pay

## 2023-04-21 DIAGNOSIS — M810 Age-related osteoporosis without current pathological fracture: Secondary | ICD-10-CM | POA: Diagnosis not present

## 2023-04-21 MED ORDER — DENOSUMAB 60 MG/ML ~~LOC~~ SOSY
60.0000 mg | PREFILLED_SYRINGE | Freq: Once | SUBCUTANEOUS | Status: AC
Start: 2023-09-19 — End: 2023-10-24
  Administered 2023-10-24: 60 mg via SUBCUTANEOUS

## 2023-04-21 NOTE — Telephone Encounter (Signed)
Prolia was done 04/21/23 Next is due 10/20/23 CAM placed.

## 2023-04-21 NOTE — Progress Notes (Signed)
Amber Mccall is a 72 y.o. female presents to the office today for Prolia injection, per physician's orders. Prolia 60 mg SQ , was administered L Arm today. Patient tolerated injection. Patient next injection due: 6 months, appt made: No- will schedule in 5 months after benifits are ran again Initial injection: NO Patient supplied: YES   Creft, Melton Alar L

## 2023-06-16 ENCOUNTER — Other Ambulatory Visit: Payer: Self-pay

## 2023-07-12 ENCOUNTER — Ambulatory Visit: Payer: 59 | Admitting: Orthopaedic Surgery

## 2023-07-12 ENCOUNTER — Telehealth: Payer: Self-pay | Admitting: *Deleted

## 2023-07-12 ENCOUNTER — Other Ambulatory Visit (INDEPENDENT_AMBULATORY_CARE_PROVIDER_SITE_OTHER): Payer: Self-pay

## 2023-07-12 ENCOUNTER — Encounter: Payer: Self-pay | Admitting: Orthopaedic Surgery

## 2023-07-12 DIAGNOSIS — Z96652 Presence of left artificial knee joint: Secondary | ICD-10-CM

## 2023-07-12 NOTE — Telephone Encounter (Signed)
 1 year Ortho bundle meeting completed for Left TKR.

## 2023-07-12 NOTE — Progress Notes (Signed)
 Post-Op Visit Note   Patient: Amber Mccall           Date of Birth: 10-20-1950           MRN: [id number] Visit Date: 07/12/2023 PCP: Sharlene Dory, DO   Assessment & Plan:  Chief Complaint:  Chief Complaint  Patient presents with   Left Knee - Follow-up    Left total knee arthroplasty 06/20/2022   Visit Diagnoses:  1. Status post total left knee replacement     Plan: Patient is 1 year status post left total knee arthroplasty.  She is doing well overall.  Has occasional discomfort but she is happy with the outcome.  She has resumed all of her activities.  Exam of the left knee shows a fully healed surgical scar.  Excellent range of motion.  Collaterals are stable.  Implant is stable on x-rays.  Continue dental prophylaxis for another year.  She is doing well from both knee replacements.  Will see her 1 more time in a year for a final checkup of both knees with repeat radiographs.  Follow-Up Instructions: Return in about 1 year (around 07/11/2024).   Orders:  Orders Placed This Encounter  Procedures   XR Knee 1-2 Views Left   No orders of the defined types were placed in this encounter.   Imaging: No results found.  PMFS History: Patient Active Problem List   Diagnosis Date Noted   Status post total left knee replacement 06/20/2022   Status post total right knee replacement 10/18/2021   Primary osteoarthritis of right knee 10/17/2021   Spondylosis without myelopathy or radiculopathy, lumbar region 09/28/2021   Chronic right shoulder pain 08/18/2021   Adhesive capsulitis of right shoulder 08/18/2021   Gastroesophageal reflux disease 08/18/2021   Primary osteoarthritis of left knee 07/27/2021   Primary osteoarthritis involving multiple joints 07/13/2021   Strain of right trapezius muscle 07/13/2021   Essential hypertension 05/19/2021   Hypertriglyceridemia 05/19/2021   Osteoporosis 05/19/2021   Lumbar radiculopathy 05/19/2021   Osteoarthritis 05/19/2021    Depression, major, single episode, in partial remission (HCC) 05/19/2021   Chronic right-sided low back pain 05/19/2021   Status post left foot surgery 04/23/2015   Injury of foot, left 04/08/2015   Sprain of ankle 04/08/2015   Deformity of metatarsal bone of left foot 04/08/2015   Metatarsalgia of left foot 02/10/2015   Tenosynovitis of foot 02/10/2015   Chronic pain of both knees 02/10/2015   Closed fracture of metatarsal bone 10/21/2014   Edema of left foot 10/21/2014   Closed fracture of metatarsal bone 01/30/2013   Pain, foot 01/30/2013   Abnormal foot finding 01/30/2013   Past Medical History:  Diagnosis Date   Anginal pain (HCC)    pt says she feels like this may be related to acid reflux   Closed fracture of metatarsal bone(s) 01/30/2013   Depression    Essential hypertension    GERD (gastroesophageal reflux disease)    Lumbar radiculopathy    Osteoarthritis    Osteoporosis     Family History  Problem Relation Age of Onset   Colon cancer Neg Hx    Rectal cancer Neg Hx    Stomach cancer Neg Hx     Past Surgical History:  Procedure Laterality Date   ABDOMINAL HYSTERECTOMY     APPENDECTOMY     CATARACT EXTRACTION Bilateral 2022   Cotton Osteotomy w/ Bone Graft Left 04/16/2015   Left foot   IR RADIOLOGIST EVAL & MGMT  04/11/2017   OPEN REDUCTION INTERNAL FIXATION (ORIF) DISTAL PHALANX Left 10/23/2014   5th Metatarsal   TOTAL KNEE ARTHROPLASTY Right 10/18/2021   Procedure: RIGHT TOTAL KNEE ARTHROPLASTY;  Surgeon: Tarry Kos, MD;  Location: [facility] OR;  Service: Orthopedics;  Laterality: Right;   TOTAL KNEE ARTHROPLASTY Left 06/20/2022   Procedure: LEFT TOTAL KNEE REPLACEMENT;  Surgeon: Tarry Kos, MD;  Location: [facility] OR;  Service: Orthopedics;  Laterality: Left;   Social History   Occupational History   Occupation: retired  Tobacco Use   Smoking status: Never   Smokeless tobacco: Never  Vaping Use   Vaping status: Never Used  Substance and Sexual Activity    Alcohol use: Not Currently   Drug use: Not Currently   Sexual activity: Not on file

## 2023-09-02 ENCOUNTER — Other Ambulatory Visit: Payer: Self-pay | Admitting: Family Medicine

## 2023-09-02 DIAGNOSIS — F324 Major depressive disorder, single episode, in partial remission: Secondary | ICD-10-CM

## 2023-09-11 ENCOUNTER — Ambulatory Visit: Payer: 59 | Admitting: Family Medicine

## 2023-09-11 ENCOUNTER — Encounter: Payer: Self-pay | Admitting: Family Medicine

## 2023-09-11 VITALS — BP 130/78 | HR 81 | Temp 97.8°F | Resp 16 | Ht 61.0 in | Wt 147.0 lb

## 2023-09-11 DIAGNOSIS — I10 Essential (primary) hypertension: Secondary | ICD-10-CM

## 2023-09-11 DIAGNOSIS — Z0001 Encounter for general adult medical examination with abnormal findings: Secondary | ICD-10-CM | POA: Diagnosis not present

## 2023-09-11 DIAGNOSIS — G8929 Other chronic pain: Secondary | ICD-10-CM | POA: Diagnosis not present

## 2023-09-11 DIAGNOSIS — M25512 Pain in left shoulder: Secondary | ICD-10-CM | POA: Diagnosis not present

## 2023-09-11 DIAGNOSIS — K529 Noninfective gastroenteritis and colitis, unspecified: Secondary | ICD-10-CM | POA: Diagnosis not present

## 2023-09-11 DIAGNOSIS — M25511 Pain in right shoulder: Secondary | ICD-10-CM | POA: Diagnosis not present

## 2023-09-11 DIAGNOSIS — Z0000 Encounter for general adult medical examination without abnormal findings: Secondary | ICD-10-CM

## 2023-09-11 MED ORDER — DICLOFENAC SODIUM 1 % EX GEL: 4.0000 g | Freq: Three times a day (TID) | CUTANEOUS | 5 refills | Status: AC | PRN

## 2023-09-11 MED ORDER — METHYLPREDNISOLONE ACETATE 40 MG/ML IJ SUSP
40.0000 mg | Freq: Once | INTRAMUSCULAR | Status: AC
Start: 2023-09-11 — End: 2023-09-11
  Administered 2023-09-11: 40 mg via INTRAMUSCULAR

## 2023-09-11 NOTE — Patient Instructions (Addendum)
 Give us  2-3 business days to get the results of your labs back.   Keep the diet clean and stay active.  Take 1200 mg of calcium daily and at least 1000 units of vitamin D3 daily.   Take Metamucil or Benefiber daily.  Please contact the GI team at: [phone]  Let us  know if you need anything.  Trapezius stretches/exercises Do exercises exactly as told by your health care provider and adjust them as directed. It is normal to feel mild stretching, pulling, tightness, or discomfort as you do these exercises, but you should stop right away if you feel sudden pain or your pain gets worse.   Stretching and range of motion exercises These exercises warm up your muscles and joints and improve the movement and flexibility of your shoulder. These exercises can also help to relieve pain, numbness, and tingling. If you are unable to do any of the following for any reason, do not further attempt to do it.   Exercise A: Flexion, standing     Stand and hold a broomstick, a cane, or a similar object. Place your hands a little more than shoulder-width apart on the object. Your left / right hand should be palm-up, and your other hand should be palm-down. Push the stick to raise your left / right arm out to your side and then over your head. Use your other hand to help move the stick. Stop when you feel a stretch in your shoulder, or when you reach the angle that is recommended by your health care provider. Avoid shrugging your shoulder while you raise your arm. Keep your shoulder blade tucked down toward your spine. Hold for 30 seconds. Slowly return to the starting position. Repeat 2 times. Complete this exercise 3 times per week.  Exercise B: Abduction, supine     Lie on your back and hold a broomstick, a cane, or a similar object. Place your hands a little more than shoulder-width apart on the object. Your left / right hand should be palm-up, and your other hand should be palm-down. Push the stick  to raise your left / right arm out to your side and then over your head. Use your other hand to help move the stick. Stop when you feel a stretch in your shoulder, or when you reach the angle that is recommended by your health care provider. Avoid shrugging your shoulder while you raise your arm. Keep your shoulder blade tucked down toward your spine. Hold for 30 seconds. Slowly return to the starting position. Repeat 2 times. Complete this exercise 3 times per week.  Exercise C: Flexion, active-assisted     Lie on your back. You may bend your knees for comfort. Hold a broomstick, a cane, or a similar object. Place your hands about shoulder-width apart on the object. Your palms should face toward your feet. Raise the stick and move your arms over your head and behind your head, toward the floor. Use your healthy arm to help your left / right arm move farther. Stop when you feel a gentle stretch in your shoulder, or when you reach the angle where your health care provider tells you to stop. Hold for 30 seconds. Slowly return to the starting position. Repeat 2 times. Complete this exercise 3 times per week.  Exercise D: External rotation and abduction     Stand in a door frame with one of your feet slightly in front of the other. This is called a staggered stance. Choose one of  the following positions as told by your health care provider: Place your hands and forearms on the door frame above your head. Place your hands and forearms on the door frame at the height of your head. Place your hands on the door frame at the height of your elbows. Slowly move your weight onto your front foot until you feel a stretch across your chest and in the front of your shoulders. Keep your head and chest upright and keep your abdominal muscles tight. Hold for 30 seconds. To release the stretch, shift your weight to your back foot. Repeat 2 times. Complete this stretch 3 times per week.  Strengthening  exercises These exercises build strength and endurance in your shoulder. Endurance is the ability to use your muscles for a long time, even after your muscles get tired. Exercise E: Scapular depression and adduction  Sit on a stable chair. Support your arms in front of you with pillows, armrests, or a tabletop. Keep your elbows in line with the sides of your body. Gently move your shoulder blades down toward your middle back. Relax the muscles on the tops of your shoulders and in the back of your neck. Hold for 3 seconds. Slowly release the tension and relax your muscles completely before doing this exercise again. Repeat for a total of 10 repetitions. After you have practiced this exercise, try doing the exercise without the arm support. Then, try the exercise while standing instead of sitting. Repeat 2 times. Complete this exercise 3 times per week.  Exercise F: Shoulder abduction, isometric     Stand or sit about 4-6 inches (10-15 cm) from a wall with your left / right side facing the wall. Bend your left / right elbow and gently press your elbow against the wall. Increase the pressure slowly until you are pressing as hard as you can without shrugging your shoulder. Hold for 3 seconds. Slowly release the tension and relax your muscles completely. Repeat for a total of 10 repetitions. Repeat 2 times. Complete this exercise 3 times per week.  Exercise G: Shoulder flexion, isometric     Stand or sit about 4-6 inches (10-15 cm) away from a wall with your left / right side facing the wall. Keep your left / right elbow straight and gently press the top of your fist against the wall. Increase the pressure slowly until you are pressing as hard as you can without shrugging your shoulder. Hold for 10-15 seconds. Slowly release the tension and relax your muscles completely. Repeat for a total of 10 repetitions. Repeat 2 times. Complete this exercise 3 times per week.  Exercise H: Internal  rotation     Sit in a stable chair without armrests, or stand. Secure an exercise band at your left / right side, at elbow height. Place a soft object, such as a folded towel or a small pillow, under your left / right upper arm so your elbow is a few inches (about 8 cm) away from your side. Hold the end of the exercise band so the band stretches. Keeping your elbow pressed against the soft object under your arm, move your forearm across your body toward your abdomen. Keep your body steady so the movement is only coming from your shoulder. Hold for 3 seconds. Slowly return to the starting position. Repeat for a total of 10 repetitions. Repeat 2 times. Complete this exercise 3 times per week.  Exercise I: External rotation     Sit in a stable chair without  armrests, or stand. Secure an exercise band at your left / right side, at elbow height. Place a soft object, such as a folded towel or a small pillow, under your left / right upper arm so your elbow is a few inches (about 8 cm) away from your side. Hold the end of the exercise band so the band stretches. Keeping your elbow pressed against the soft object under your arm, move your forearm out, away from your abdomen. Keep your body steady so the movement is only coming from your shoulder. Hold for 3 seconds. Slowly return to the starting position. Repeat for a total of 10 repetitions. Repeat 2 times. Complete this exercise 3 times per week. Exercise J: Shoulder extension  Sit in a stable chair without armrests, or stand. Secure an exercise band to a stable object in front of you so the band is at shoulder height. Hold one end of the exercise band in each hand. Your palms should face each other. Straighten your elbows and lift your hands up to shoulder height. Step back, away from the secured end of the exercise band, until the band stretches. Squeeze your shoulder blades together and pull your hands down to the sides of your thighs. Stop  when your hands are straight down by your sides. Do not let your hands go behind your body. Hold for 3 seconds. Slowly return to the starting position. Repeat for a total of 10 repetitions. Repeat 2 times. Complete this exercise 3 times per week.  Exercise K: Shoulder extension, prone     Lie on your abdomen on a firm surface so your left / right arm hangs over the edge. Hold a 5 lb weight in your hand so your palm faces in toward your body. Your arm should be straight. Squeeze your shoulder blade down toward the middle of your back. Slowly raise your arm behind you, up to the height of the surface that you are lying on. Keep your arm straight. Hold for 3 seconds. Slowly return to the starting position and relax your muscles. Repeat for a total of 10 repetitions. Repeat 2 times. Complete this exercise 3 times per week.   Exercise L: Horizontal abduction, prone  Lie on your abdomen on a firm surface so your left / right arm hangs over the edge. Hold a 5 lb weight in your hand so your palm faces toward your feet. Your arm should be straight. Squeeze your shoulder blade down toward the middle of your back. Bend your elbow so your hand moves up, until your elbow is bent to an "L" shape (90 degrees). With your elbow bent, slowly move your forearm forward and up. Raise your hand up to the height of the surface that you are lying on. Your upper arm should not move, and your elbow should stay bent. At the top of the movement, your palm should face the floor. Hold for 3 seconds. Slowly return to the starting position and relax your muscles. Repeat for a total of 10 repetitions. Repeat 2 times. Complete this exercise 3 times per week.  Exercise M: Horizontal abduction, standing  Sit on a stable chair, or stand. Secure an exercise band to a stable object in front of you so the band is at shoulder height. Hold one end of the exercise band in each hand. Straighten your elbows and lift your hands  straight in front of you, up to shoulder height. Your palms should face down, toward the floor. Step back, away from  the secured end of the exercise band, until the band stretches. Move your arms out to your sides, and keep your arms straight. Hold for 3 seconds. Slowly return to the starting position. Repeat for a total of 10 repetitions. Repeat 2 times. Complete this exercise 3 times per week.  Exercise N: Scapular retraction and elevation  Sit on a stable chair, or stand. Secure an exercise band to a stable object in front of you so the band is at shoulder height. Hold one end of the exercise band in each hand. Your palms should face each other. Sit in a stable chair without armrests, or stand. Step back, away from the secured end of the exercise band, until the band stretches. Squeeze your shoulder blades together and lift your hands over your head. Keep your elbows straight. Hold for 3 seconds. Slowly return to the starting position. Repeat for a total of 10 repetitions. Repeat 2 times. Complete this exercise 3 times per week.  This information is not intended to replace advice given to you by your health care provider. Make sure you discuss any questions you have with your health care provider. Document Released: 04/11/2005 Document Revised: 12/17/2015 Document Reviewed: 02/26/2015 Elsevier Interactive Patient Education  2017 [employer].

## 2023-09-11 NOTE — Progress Notes (Signed)
 Chief Complaint  Patient presents with   Annual Exam    CPE     Well Woman Amber Mccall is here for a complete physical. Here w son who helps interpret ([place]).  Her last physical was >1 year ago.  Current diet: in general, a "healthy" diet. Current exercise: walking, some strength straining. Weight is up a little and she denies daytime fatigue. Seatbelt? Yes Advanced directive? No  Health Maintenance Colonoscopy- Yes Shingrix- Yes DEXA- Yes Mammogram- Yes Tetanus- Yes Pneumonia- Yes Hep C screen- Yes  Bilateral shoulder pain for a few years. Has not tried anything at home thus far. Seems to be worsening. Starts at trap and radiates down thru arms. No neuro ss's, bruising, redness, swelling.   Patient has had a couple years of diarrhea.  We thought it was related to her stress levels.  She has been using Imodium with some relief.  She does have a gastroenterologist but has never told them about this.  No bleeding, nausea, vomiting, abdominal pain.  Past Medical History:  Diagnosis Date   Anginal pain (HCC)    pt says she feels like this may be related to acid reflux   Closed fracture of metatarsal bone(s) 01/30/2013   Depression    Essential hypertension    GERD (gastroesophageal reflux disease)    Lumbar radiculopathy    Osteoarthritis    Osteoporosis      Past Surgical History:  Procedure Laterality Date   ABDOMINAL HYSTERECTOMY     APPENDECTOMY     CATARACT EXTRACTION Bilateral 2022   [place] Osteotomy w/ Bone Graft Left 04/16/2015   Left foot   IR RADIOLOGIST EVAL & MGMT  04/11/2017   OPEN REDUCTION INTERNAL FIXATION (ORIF) DISTAL PHALANX Left 10/23/2014   5th Metatarsal   TOTAL KNEE ARTHROPLASTY Right 10/18/2021   Procedure: RIGHT TOTAL KNEE ARTHROPLASTY;  Surgeon: Wes Hamman, MD;  Location: [facility] OR;  Service: Orthopedics;  Laterality: Right;   TOTAL KNEE ARTHROPLASTY Left 06/20/2022   Procedure: LEFT TOTAL KNEE REPLACEMENT;  Surgeon: Wes Hamman, MD;   Location: [facility] OR;  Service: Orthopedics;  Laterality: Left;    Medications  Current Outpatient Medications on File Prior to Visit  Medication Sig Dispense Refill   amLODipine  (NORVASC ) 10 MG tablet Take 1 tablet (10 mg total) by mouth daily. 90 tablet 3   calcium citrate (CALCITRATE - DOSED IN MG ELEMENTAL CALCIUM) 950 (200 Ca) MG tablet Take 950 mg by mouth daily.     denosumab  (PROLIA ) 60 MG/ML SOSY injection Inject 60 mg into the skin every 6 (six) months. Dx code: M81.0 .  Pt has appt on 04/21/23 1 mL 0   diclofenac  Sodium (VOLTAREN ) 1 % GEL Apply 1 Application topically as needed (pain).     gabapentin  (NEURONTIN ) 100 MG capsule Take 1 capsule (100 mg total) by mouth daily. 90 capsule 2   HYDROmorphone  (DILAUDID ) 2 MG tablet Take 1 tablet (2 mg total) by mouth every 6 (six) hours as needed for severe pain. 30 tablet 0   lidocaine  4 % Place 1 patch onto the skin daily. 5 patch 6   methocarbamol  (ROBAXIN ) 750 MG tablet TAKE 1 TABLET(750 MG) BY MOUTH TWICE DAILY AS NEEDED FOR MUSCLE SPASMS 20 tablet 2   pantoprazole  (PROTONIX ) 40 MG tablet TAKE 1 TABLET(40 MG) BY MOUTH DAILY 30 tablet 3   promethazine  (PHENERGAN ) 12.5 MG tablet Take 1 tablet (12.5 mg total) by mouth every 6 (six) hours as needed for nausea or vomiting.  30 tablet 0   rivaroxaban  (XARELTO ) 10 MG TABS tablet Take 1 tablet (10 mg total) by mouth daily. To be taken after surgery (instead of aspirin ) to prevent blood clots 28 tablet 0   sertraline  (ZOLOFT ) 50 MG tablet Take 1 tablet (50 mg total) by mouth daily. 90 tablet 2   traZODone  (DESYREL ) 100 MG tablet TAKE 1 TABLET(100 MG) BY MOUTH AT BEDTIME 90 tablet 3   VITAMIN D, CHOLECALCIFEROL, PO Take 1 capsule by mouth daily.     Allergies No Known Allergies  Review of Systems: Constitutional:  no fevers Eye:  no recent significant change in vision Ears:  No changes in hearing Nose/Mouth/Throat:  no complaints of nasal congestion, no sore throat Cardiovascular: no chest  pain Respiratory:  No shortness of breath Gastrointestinal:  +continued diarrhea GU:  Female: negative for dysuria Integumentary:  no abnormal skin lesions reported Neurologic:  no headaches Endocrine:  denies unexplained weight changes  Exam BP 130/78 (BP Location: Left Arm, Patient Position: Sitting)   Pulse 81   Temp 97.8 F (36.6 C) (Oral)   Resp 16   Ht 5\' 1"  (1.549 m)   Wt 147 lb (66.7 kg)   SpO2 95%   BMI 27.78 kg/m  General:  well developed, well nourished, in no apparent distress Skin:  no significant moles, warts, or growths Head:  no masses, lesions, or tenderness Eyes:  pupils equal and round, sclera anicteric without injection Ears:  canals without lesions, TMs shiny without retraction, no obvious effusion, no erythema Nose:  nares patent, mucosa normal, and no drainage Throat/Pharynx:  lips and gingiva without lesion; tongue and uvula midline; non-inflamed pharynx; no exudates or postnasal drainage Neck: neck supple without adenopathy, thyromegaly, or masses Lungs:  clear to auscultation, breath sounds equal bilaterally, no respiratory distress Cardio:  regular rate and rhythm, no bruits or LE edema MSK: TTP over the trapezius musculature bilaterally.  No bony tenderness.  Decreased forward flexion bilaterally.  Negative Hawkins, crossover, empty can, liftoff, speeds, O'Brien's bilaterally. Abdomen:  abdomen soft, nontender; bowel sounds normal; no masses or organomegaly Genital: Deferred Neuro:  gait normal; deep tendon reflexes normal and symmetric Psych: well oriented with normal range of affect and appropriate judgment/insight  Procedure note; trigger point injection  consent obtained. The areas of interest were demarcated with an otoscope speculum tip. There were then cleaned with alcohol. 0.4 mL were injected at each trigger point with a concoction of 1% Marcaine  and 80 mg Depo-Medrol . Forward down on the right, 5 on the left. The areas were then  bandaged. There were no complications noted. The patient tolerated the procedure well.  Assessment and Plan  Well adult exam  Chronic pain of both shoulders - Plan: INJECT TRIGGER POINT, 1 OR 2, INJECT TRIGGER POINTS, > 3  Essential hypertension - Plan: CBC, Comprehensive metabolic panel with GFR, Lipid panel  Chronic diarrhea   Well 73 y.o. female. Counseled on diet and exercise. Other orders as above. Advanced directive form provided today.  Diarrhea: Could be functional. GI info provided.  Take Metamucil daily. Shoulder pain: Chronic, not controlled.  Trigger point injections today.  Stretches and exercises, heat, ice, Tylenol .  Consider sports medicine referral if no improvement. Follow up in 6 mo. The patient thru her son voiced understanding and agreement to the plan.  Shellie Dials [place], DO 09/11/23 9:47 AM

## 2023-09-11 NOTE — Addendum Note (Signed)
 Addended by: Milli Woolridge M on: 09/11/2023 09:58 AM   Modules accepted: Orders

## 2023-09-12 ENCOUNTER — Other Ambulatory Visit: Payer: Self-pay | Admitting: Family Medicine

## 2023-09-12 DIAGNOSIS — F324 Major depressive disorder, single episode, in partial remission: Secondary | ICD-10-CM

## 2023-09-19 ENCOUNTER — Telehealth: Payer: Self-pay

## 2023-09-19 ENCOUNTER — Other Ambulatory Visit (HOSPITAL_COMMUNITY): Payer: Self-pay

## 2023-09-19 NOTE — Telephone Encounter (Signed)
 Prolia  VOB initiated via [email or web address]  Next Prolia  inj DUE: 10/20/23   PHARMACY BENEFIT: $0

## 2023-09-21 ENCOUNTER — Other Ambulatory Visit (HOSPITAL_COMMUNITY): Payer: Self-pay

## 2023-09-21 NOTE — Telephone Encounter (Signed)
 Pt ready for scheduling for PROLIA  on or after : 10/20/23  Option# 1: Buy/Bill (Office supplied medication)  Out-of-pocket cost due at time of clinic visit: $531.06  Number of injection/visits approved: 2  Primary: [employer] MEDICARE Prolia  co-insurance: 20% Admin fee co-insurance: 20%  Secondary: --- Prolia  co-insurance:  Admin fee co-insurance:   Medical Benefit Details: Date Benefits were checked: 09/21/23 Deductible: $82.94 Met of $257 Required/ Coinsurance: 20%/ Admin Fee: 20%  Prior Auth: APPROVED PA# [id number] Expiration Date: 10/20/23-10/19/24  # of doses approved: 2 ----------------------------------------------------------------------- Option# 2- Med Obtained from pharmacy:  Pharmacy benefit: Copay $0 (Paid to pharmacy) Admin Fee: 20% approximately $25 (Pay at clinic)  Prior Auth: N/A PA# Expiration Date:   # of doses approved:   If patient wants fill through the pharmacy benefit please send prescription to: [email or web address], and include estimated need by date in rx notes. Pharmacy will ship medication directly to the office.  Patient NOT eligible for Prolia  Copay Card. Copay Card can make patient's cost as little as $25. Link to apply: [email or web address]  ** This summary of benefits is an estimation of the patient's out-of-pocket cost. Exact cost may very based on individual plan coverage.

## 2023-09-21 NOTE — Telephone Encounter (Signed)
 Amber Mccall

## 2023-09-22 ENCOUNTER — Other Ambulatory Visit: Payer: Self-pay | Admitting: *Deleted

## 2023-09-22 ENCOUNTER — Other Ambulatory Visit: Payer: Self-pay

## 2023-09-22 DIAGNOSIS — M810 Age-related osteoporosis without current pathological fracture: Secondary | ICD-10-CM

## 2023-09-22 MED ORDER — PROLIA 60 MG/ML ~~LOC~~ SOSY
60.0000 mg | PREFILLED_SYRINGE | SUBCUTANEOUS | 0 refills | Status: DC
  Filled 2023-09-22 – 2023-10-04 (×2): qty 1, 180d supply, fill #0

## 2023-10-04 ENCOUNTER — Other Ambulatory Visit: Payer: Self-pay

## 2023-10-04 NOTE — Progress Notes (Signed)
 Specialty Pharmacy Refill Coordination Note  Amber Mccall is a 73 y.o. female assessed today regarding refills of clinic administered specialty medication(s) Denosumab  (Prolia )   Clinic requested Delivery   Delivery date: 10/17/23   Verified address: [address], [address], [place]   Medication will be filled on 06.23.25.

## 2023-10-16 ENCOUNTER — Other Ambulatory Visit: Payer: Self-pay

## 2023-10-24 ENCOUNTER — Other Ambulatory Visit (HOSPITAL_BASED_OUTPATIENT_CLINIC_OR_DEPARTMENT_OTHER): Payer: Self-pay

## 2023-10-24 ENCOUNTER — Ambulatory Visit (INDEPENDENT_AMBULATORY_CARE_PROVIDER_SITE_OTHER): Admitting: *Deleted

## 2023-10-24 DIAGNOSIS — M810 Age-related osteoporosis without current pathological fracture: Secondary | ICD-10-CM

## 2023-10-24 MED ORDER — DENOSUMAB 60 MG/ML ~~LOC~~ SOSY
60.0000 mg | PREFILLED_SYRINGE | SUBCUTANEOUS | Status: DC
Start: 2024-04-22 — End: 2024-03-11

## 2023-10-24 NOTE — Progress Notes (Signed)
 Patient here for Prolia  injection per physicians orders  Prolia  60 mg SQ , was administered right arm today. Patient tolerated injection.  Patient next injection due: 6 months, appt made:  No- will schedule in 5 months after benefits are ran again  Initial injection: no  Did Prolia  come from pharmacy (if yes please select patient supplied): yes  Cam placed for next injection: yes

## 2023-11-20 DIAGNOSIS — H353131 Nonexudative age-related macular degeneration, bilateral, early dry stage: Secondary | ICD-10-CM | POA: Diagnosis not present

## 2023-11-20 DIAGNOSIS — H35363 Drusen (degenerative) of macula, bilateral: Secondary | ICD-10-CM | POA: Diagnosis not present

## 2024-02-26 ENCOUNTER — Encounter: Payer: Self-pay | Admitting: Radiology

## 2024-03-11 ENCOUNTER — Ambulatory Visit: Admitting: Family Medicine

## 2024-03-11 ENCOUNTER — Encounter: Payer: Self-pay | Admitting: Family Medicine

## 2024-03-11 ENCOUNTER — Other Ambulatory Visit: Payer: Self-pay

## 2024-03-11 VITALS — BP 132/80 | HR 90 | Resp 16 | Ht 61.0 in | Wt 152.0 lb

## 2024-03-11 DIAGNOSIS — F339 Major depressive disorder, recurrent, unspecified: Secondary | ICD-10-CM | POA: Diagnosis not present

## 2024-03-11 DIAGNOSIS — M810 Age-related osteoporosis without current pathological fracture: Secondary | ICD-10-CM

## 2024-03-11 DIAGNOSIS — I10 Essential (primary) hypertension: Secondary | ICD-10-CM

## 2024-03-11 DIAGNOSIS — M25532 Pain in left wrist: Secondary | ICD-10-CM

## 2024-03-11 DIAGNOSIS — R0789 Other chest pain: Secondary | ICD-10-CM | POA: Diagnosis not present

## 2024-03-11 MED ORDER — AMLODIPINE BESYLATE 10 MG PO TABS
10.0000 mg | ORAL_TABLET | Freq: Every day | ORAL | 3 refills | Status: AC
Start: 2024-03-11 — End: ?

## 2024-03-11 MED ORDER — BUPROPION HCL ER (XL) 150 MG PO TB24: 150.0000 mg | ORAL_TABLET | Freq: Every day | ORAL | 1 refills | Status: DC

## 2024-03-11 MED ORDER — SERTRALINE HCL 50 MG PO TABS
50.0000 mg | ORAL_TABLET | Freq: Every day | ORAL | 1 refills | Status: DC
Start: 2024-03-11 — End: 2024-03-11

## 2024-03-11 NOTE — Patient Instructions (Addendum)
 Keep the diet clean and stay active.  Please consider getting your tetanus shot at the pharmacy.  Wear your wrist brace at night and during aggravating activities.   Heat (pad or rice pillow in microwave) over affected area, 10-15 minutes twice daily.   Ice/cold pack over area for 10-15 min twice daily.  OK to take Tylenol  1000 mg (2 extra strength tabs) or 975 mg (3 regular strength tabs) every 6 hours as needed.  Let us  know if you need anything.  Wrist and Forearm Exercises Do exercises exactly as told by your health care provider and adjust them as directed. It is normal to feel mild stretching, pulling, tightness, or discomfort as you do these exercises, but you should stop right away if you feel sudden pain or your pain gets worse.   RANGE OF MOTION EXERCISES These exercises warm up your muscles and joints and improve the movement and flexibility of your injured wrist and forearm. These exercises also help to relieve pain, numbness, and tingling. These exercises are done using the muscles in your injured wrist and forearm. Exercise A: Wrist Flexion, Active With your fingers relaxed, bend your wrist forward as far as you can. Hold this position for 30 seconds. Repeat 2 times. Complete this exercise 3 times per week. Exercise B: Wrist Extension, Active With your fingers relaxed, bend your wrist backward as far as you can. Hold this position for 30 seconds. Repeat 2 times. Complete this exercise 3 times per week. Exercise C: Supination, Active  Stand or sit with your arms at your sides. Bend your left / right elbow to an L shape (90 degrees). Turn your palm upward until you feel a gentle stretch on the inside of your forearm. Hold this position for 30 seconds. Slowly return your palm to the starting position. Repeat 2 times. Complete this exercise 3 times per week. Exercise D: Pronation, Active  Stand or sit with your arms at your sides. Bend your left / right elbow to an  L shape (90 degrees). Turn your palm downward until you feel a gentle stretch on the top of your forearm. Hold this position for 30 seconds. Slowly return your palm to the starting position. Repeat 2 times. Complete this exercise once a day.  STRETCHING EXERCISES These exercises warm up your muscles and joints and improve the movement and flexibility of your injured wrist and forearm. These exercises also help to relieve pain, numbness, and tingling. These exercises are done using your healthy wrist and forearm to help stretch the muscles in your injured wrist and forearm. Exercise E: Wrist Flexion, Passive  Extend your left / right arm in front of you, relax your wrist, and point your fingers downward. Gently push on the back of your hand. Stop when you feel a gentle stretch on the top of your forearm. Hold this position for 30 seconds. Repeat 2 times. Complete this exercise 3 times per week. Exercise F: Wrist Extension, Passive  Extend your left / right arm in front of you and turn your palm upward. Gently pull your palm and fingertips back so your fingers point downward. You should feel a gentle stretch on the palm-side of your forearm. Hold this position for 30 seconds. Repeat 2 times. Complete this exercise 3 times per week. Exercise G: Forearm Rotation, Supination, Passive Sit with your left / right elbow bent to an L shape (90 degrees) with your forearm resting on a table. Keeping your upper body and shoulder still, use your other  hand to rotate your forearm palm-up until you feel a gentle to moderate stretch. Hold this position for 30 seconds. Slowly release the stretch and return to the starting position. Repeat 2 times. Complete this exercise 3 times per week. Exercise H: Forearm Rotation, Pronation, Passive Sit with your left / right elbow bent to an L shape (90 degrees) with your forearm resting on a table. Keeping your upper body and shoulder still, use your other hand  to rotate your forearm palm-down until you feel a gentle to moderate stretch. Hold this position for 30 seconds. Slowly release the stretch and return to the starting position. Repeat 2 times. Complete this exercise 3 times per week.  STRENGTHENING EXERCISES These exercises build strength and endurance in your wrist and forearm. Endurance is the ability to use your muscles for a long time, even after they get tired. Exercise I: Wrist Flexors  Sit with your left / right forearm supported on a table and your hand resting palm-up over the edge of the table. Your elbow should be bent to an L shape (about 90 degrees) and be below the level of your shoulder. Hold a 3-5 lb weight in your left / right hand. Or, hold a rubber exercise band or tube in both hands, keeping your hands at the same level and hip distance apart. There should be a slight tension in the exercise band or tube. Slowly curl your hand up toward your forearm. Hold this position for 3 seconds. Slowly lower your hand back to the starting position. Repeat 2 times. Complete this exercise 3 times per week. Exercise J: Wrist Extensors  Sit with your left / right forearm supported on a table and your hand resting palm-down over the edge of the table. Your elbow should be bent to an L shape (about 90 degrees) and be below the level of your shoulder. Hold a 3-5 lb weight in your left / right hand. Or, hold a rubber exercise band or tube in both hands, keeping your hands at the same level and hip distance apart. There should be a slight tension in the exercise band or tube. Slowly curl your hand up toward your forearm. Hold this position for 3 seconds. Slowly lower your hand back to the starting position. Repeat 2 times. Complete this exercise 3 times per week. Exercise K: Forearm Rotation, Supination  Sit with your left / right forearm supported on a table and your hand resting palm-down. Your elbow should be at your side, bent to an  L shape (about 90 degrees), and below the level of your shoulder. Keep your wrist stable and in a neutral position throughout the exercise. Gently hold a lightweight hammer with your left / right hand. Without moving your elbow or wrist, slowly rotate your palm upward to a thumbs-up position. Hold this position for 3 seconds. Slowly return your forearm to the starting position. Repeat 2 times. Complete this exercise 3 times per week. Exercise L: Forearm Rotation, Pronation  Sit with your left / right forearm supported on a table and your hand resting palm-up. Your elbow should be at your side, bent to an L shape (about 90 degrees), and below the level of your shoulder. Keep your wrist stable. Do not allow it to move backward or forward during the exercise. Gently hold a lightweight hammer with your left / right hand. Without moving your elbow or wrist, slowly rotate your palm and hand upward to a thumbs-up position. Hold this position for 3 seconds.  Slowly return your forearm to the starting position. Repeat 2 times. Complete this exercise 3 times per week. Exercise M: Grip Strengthening  Hold one of these items in your left / right hand: play dough, therapy putty, a dense sponge, a stress ball, or a large, rolled sock. Squeeze as hard as you can without increasing pain. Hold this position for 5 seconds. Slowly release your grip. Repeat 2 times. Complete this exercise 3 times per week.  This information is not intended to replace advice given to you by your health care provider. Make sure you discuss any questions you have with your health care provider. Document Released: 02/23/2005 Document Revised: 01/04/2016 Document Reviewed: 01/04/2015 Elsevier Interactive Patient Education  [employer].

## 2024-03-11 NOTE — Progress Notes (Signed)
 Chief Complaint  Patient presents with   Follow-up    Follow Up    Subjective Amber Mccall is a 73 y.o. female who presents for hypertension follow up. Here w son who helps with hx.  She does not monitor home blood pressures. She is compliant with medication -Norvasc  10 mg daily. Patient has these side effects of medication: none She is usually adhering to a healthy diet overall. Current exercise: Walking, stretching No exertional CP or SOB.  She has been having short pinches of pain in her central chest, a few times per week.  She is usually sitting when it happens.  No other associated symptoms such as reflux symptoms, abdominal pain, nausea/vomiting, arm pain, jaw pain, or shortness of breath.  No family history of heart disease.  She has never had a heart attack or stroke.  Recurrent depression Pt is currently being treated with Zoloft  50 mg daily, trazodone  100 mg nightly as needed.  Reports having some depression lately.  Unsure if the Zoloft  ever actually helped. No thoughts of harming self or others. No self-medication with alcohol, prescription drugs or illicit drugs. Pt is not following with a counselor/psychologist.  6 mo of weakness and numbness/tingling in L hand. No inj or change in activity. Not getting better. Has some swelling a/w it. Happens once every couple days. Tried Tylenol  and massage which did help.    Past Medical History:  Diagnosis Date   Anginal pain    pt says she feels like this may be related to acid reflux   Closed fracture of metatarsal bone(s) 01/30/2013   Depression    Essential hypertension    GERD (gastroesophageal reflux disease)    Lumbar radiculopathy    Osteoarthritis    Osteoporosis     Exam BP 132/80 (BP Location: Left Arm, Patient Position: Sitting)   Pulse 90   Resp 16   Ht 5' 1 (1.549 m)   Wt 152 lb (68.9 kg)   SpO2 96%   BMI 28.72 kg/m  General:  well developed, well nourished, in no apparent distress Heart: RRR, no  bruits, no LE edema Lungs: clear to auscultation, no accessory muscle use MSK: TTP over the central sternal region.  There is also TTP over the left extensor tendons. Neuro: Sensation intact to touch over the hand.  Negative Tinel's and Phalen's.  Grip strength adequate and equal bilaterally. Psych: well oriented with normal range of affect and appropriate judgment/insight  Essential hypertension - Plan: amLODipine  (NORVASC ) 10 MG tablet  Depression, recurrent - Plan: DISCONTINUED: sertraline  (ZOLOFT ) 50 MG tablet  Atypical chest pain - Plan: EKG 12-Lead  Left wrist pain  Chronic, stable.  Continue Norvasc  10 mg daily.  Counseled on diet and exercise. Chronic, unstable.  Stop Zoloft  50 mg daily, cont trazodone  100 mg nightly as needed. Start Wellbutrin XL 150 mg/d. F/u in 6 weeks to reck.  EKG shows NSR, normal axis, no interval abnormalities, no ST segment or T wave changes, good R wave progression.  Would consider musculoskeletal chest wall pain.  She will let me know if it is persistent or worsening. Seems to be a left wrist tendinitis.  She has a wrist brace at home which she will wear at night and during aggravating activities.  Heat, ice, Tylenol , stretches and exercises. The patient thru her son voiced understanding and agreement to the plan.  I spent 42 minutes with the patient discussing the above plans in addition to reviewing her chart on the same day  the visit.  Mabel [place], DO 03/11/24  11:06 AM

## 2024-03-12 ENCOUNTER — Encounter: Payer: Self-pay | Admitting: Family Medicine

## 2024-03-22 ENCOUNTER — Other Ambulatory Visit (HOSPITAL_COMMUNITY): Payer: Self-pay

## 2024-04-09 ENCOUNTER — Other Ambulatory Visit (HOSPITAL_COMMUNITY): Payer: Self-pay

## 2024-04-10 ENCOUNTER — Other Ambulatory Visit: Payer: Self-pay

## 2024-04-10 ENCOUNTER — Other Ambulatory Visit: Payer: Self-pay | Admitting: Family Medicine

## 2024-04-10 DIAGNOSIS — M810 Age-related osteoporosis without current pathological fracture: Secondary | ICD-10-CM

## 2024-04-11 ENCOUNTER — Other Ambulatory Visit: Payer: Self-pay

## 2024-04-12 ENCOUNTER — Other Ambulatory Visit: Payer: Self-pay | Admitting: Family Medicine

## 2024-04-12 ENCOUNTER — Other Ambulatory Visit: Payer: Self-pay

## 2024-04-12 DIAGNOSIS — M810 Age-related osteoporosis without current pathological fracture: Secondary | ICD-10-CM

## 2024-04-15 ENCOUNTER — Other Ambulatory Visit (HOSPITAL_BASED_OUTPATIENT_CLINIC_OR_DEPARTMENT_OTHER)

## 2024-04-15 ENCOUNTER — Other Ambulatory Visit: Payer: Self-pay

## 2024-04-16 ENCOUNTER — Ambulatory Visit (HOSPITAL_BASED_OUTPATIENT_CLINIC_OR_DEPARTMENT_OTHER)
Admission: RE | Admit: 2024-04-16 | Discharge: 2024-04-16 | Disposition: A | Discharge status: Home / Self Care | Source: Ambulatory Visit | Attending: Family Medicine | Admitting: Family Medicine

## 2024-04-16 ENCOUNTER — Telehealth: Payer: Self-pay | Admitting: *Deleted

## 2024-04-16 ENCOUNTER — Ambulatory Visit: Payer: Self-pay | Admitting: Family Medicine

## 2024-04-16 DIAGNOSIS — M810 Age-related osteoporosis without current pathological fracture: Secondary | ICD-10-CM | POA: Diagnosis present

## 2024-04-16 NOTE — Telephone Encounter (Signed)
 Son notified that there will be a delay on getting Prolia  injection.  Our authorization team will start running ins on 04/27/23.  We will call as soon as we get the authorization back.

## 2024-04-22 ENCOUNTER — Ambulatory Visit: Admitting: Family Medicine

## 2024-04-30 ENCOUNTER — Other Ambulatory Visit (HOSPITAL_COMMUNITY): Payer: Self-pay

## 2024-04-30 ENCOUNTER — Telehealth: Payer: Self-pay

## 2024-04-30 DIAGNOSIS — M810 Age-related osteoporosis without current pathological fracture: Secondary | ICD-10-CM

## 2024-04-30 MED ORDER — DENOSUMAB 60 MG/ML ~~LOC~~ SOSY: 60.0000 mg | PREFILLED_SYRINGE | SUBCUTANEOUS | Status: DC

## 2024-04-30 NOTE — Telephone Encounter (Signed)
 Prolia VOB initiated via [email or web address]  Next Prolia inj DUE: NOW

## 2024-05-01 ENCOUNTER — Other Ambulatory Visit (HOSPITAL_COMMUNITY): Payer: Self-pay

## 2024-05-01 NOTE — Telephone Encounter (Signed)
 Amber Mccall

## 2024-05-01 NOTE — Telephone Encounter (Signed)
 [name]

## 2024-05-01 NOTE — Telephone Encounter (Signed)
 Pt ready for scheduling for PROLIA  on or after : 05/01/24  Option# 1: Buy/Bill (Office supplied medication)  Out-of-pocket cost due at time of clinic visit: $377  Number of injection/visits approved: 2  Primary: [employer]MEDICARE Prolia  co-insurance: 20% Admin fee co-insurance: 20%  Secondary: --- Prolia  co-insurance:  Admin fee co-insurance:   Medical Benefit Details: Date Benefits were checked: 04/30/24 Deductible: NO/ Coinsurance: 20%/ Admin Fee: 20%  Prior Auth: APPROVED PA# [id number] Expiration Date: 10/20/23-10/19/24  # of doses approved: 2 ----------------------------------------------------------------------- Option# 2- Med Obtained from pharmacy: [facility] PREFERRED FOR PHARMACY BENEFIT  Pharmacy benefit: Copay $1.60 (Paid to pharmacy) Admin Fee: 20% (Pay at clinic)  Prior Auth: N/A PA# Expiration Date:   # of doses approved:   If patient wants fill through the pharmacy benefit please send prescription to: [facility], and include estimated need by date in rx notes. Pharmacy will ship medication directly to the office.  Patient NOT eligible for Prolia  Copay Card. Copay Card can make patient's cost as little as $25. Link to apply: [email or web address]  ** This summary of benefits is an estimation of the patient's out-of-pocket cost. Exact cost may very based on individual plan coverage.

## 2024-05-02 ENCOUNTER — Other Ambulatory Visit: Payer: Self-pay

## 2024-05-02 ENCOUNTER — Other Ambulatory Visit (HOSPITAL_COMMUNITY): Payer: Self-pay

## 2024-05-02 ENCOUNTER — Other Ambulatory Visit: Payer: Self-pay | Admitting: *Deleted

## 2024-05-02 ENCOUNTER — Telehealth: Payer: Self-pay | Admitting: Family Medicine

## 2024-05-02 DIAGNOSIS — M810 Age-related osteoporosis without current pathological fracture: Secondary | ICD-10-CM

## 2024-05-02 MED ORDER — JUBBONTI 60 MG/ML ~~LOC~~ SOSY
60.0000 mg | PREFILLED_SYRINGE | SUBCUTANEOUS | 0 refills | Status: AC
  Filled 2024-05-02 (×2): qty 1, 180d supply, fill #0

## 2024-05-02 MED ORDER — DENOSUMAB-BBDZ 60 MG/ML ~~LOC~~ SOSY
60.0000 mg | PREFILLED_SYRINGE | Freq: Once | SUBCUTANEOUS | Status: AC
  Administered 2024-05-17: 60 mg via SUBCUTANEOUS

## 2024-05-02 NOTE — Telephone Encounter (Signed)
 Spoke with patient son about cost.

## 2024-05-02 NOTE — Telephone Encounter (Signed)
 Copied from CRM [id number]. Topic: General - Billing Inquiry >> May 02, 2024 11:16 AM Emylou G wrote: Reason for CRM: Pls call son back.. would like to know cost of prolia 

## 2024-05-02 NOTE — Progress Notes (Signed)
 Specialty Pharmacy Refill Coordination Note  Amber Mccall is a 74 y.o. female, patients son was contacted today regarding refills of specialty medication(s) Denosumab  (PROLIA )   Patient requested Courier to Provider Office   Delivery date: 05/13/24   Verified address: [address], [address], [place]   Medication will be filled on: 05/10/24   Patient's son is aware of the $1.60 copay.

## 2024-05-10 ENCOUNTER — Other Ambulatory Visit: Payer: Self-pay

## 2024-05-13 ENCOUNTER — Other Ambulatory Visit: Payer: Self-pay

## 2024-05-13 NOTE — Progress Notes (Signed)
 [facility] staff reached out stating they received this tote in error. They have contacted [facility] to have tote delivered to appropriate office.

## 2024-05-17 ENCOUNTER — Ambulatory Visit

## 2024-05-17 ENCOUNTER — Ambulatory Visit (INDEPENDENT_AMBULATORY_CARE_PROVIDER_SITE_OTHER)

## 2024-05-17 VITALS — BP 160/88 | HR 100 | Ht 61.0 in | Wt 152.6 lb

## 2024-05-17 DIAGNOSIS — M810 Age-related osteoporosis without current pathological fracture: Secondary | ICD-10-CM | POA: Diagnosis not present

## 2024-05-17 DIAGNOSIS — Z0000 Encounter for general adult medical examination without abnormal findings: Secondary | ICD-10-CM

## 2024-05-17 MED ORDER — DENOSUMAB-BBDZ 60 MG/ML ~~LOC~~ SOSY: 60.0000 mg | PREFILLED_SYRINGE | Freq: Once | SUBCUTANEOUS | Status: AC

## 2024-05-17 NOTE — Progress Notes (Signed)
 Pt was in office today for a Jubbonti  injection, injection was given subcutaneous in L arm pt tolerated well. Pt is aware she will receive a phone call to schedule next injection.

## 2024-05-17 NOTE — Patient Instructions (Signed)
 Amber Mccall,  Thank you for taking the time for your Medicare Wellness Visit. I appreciate your continued commitment to your health goals. Please review the care plan we discussed, and feel free to reach out if I can assist you further.  Please note that Annual Wellness Visits do not include a physical exam. Some assessments may be limited, especially if the visit was conducted virtually. If needed, we may recommend an in-person follow-up with your provider.  Ongoing Care Seeing your primary care provider every 3 to 6 months helps us  monitor your health and provide consistent, personalized care.   Referrals If a referral was made during today's visit and you haven't received any updates within two weeks, please contact the referred provider directly to check on the status.  Recommended Screenings:  Health Maintenance  Topic Date Due   Medicare Annual Wellness Visit  Never done   DTaP/Tdap/Td vaccine (1 - Tdap) Never done   COVID-19 Vaccine (8 - 2025-26 season) 12/25/2023   Breast Cancer Screening  11/08/2024   Colon Cancer Screening  10/08/2031   Pneumococcal Vaccine for age over 24  Completed   Flu Shot  Completed   Osteoporosis screening with Bone Density Scan  Completed   Hepatitis C Screening  Completed   Zoster (Shingles) Vaccine  Completed   Meningitis B Vaccine  Aged Out       07/06/2022    9:30 AM  Advanced Directives  Does Patient Have a Medical Advance Directive? --    Vision: Annual vision screenings are recommended for early detection of glaucoma, cataracts, and diabetic retinopathy. These exams can also reveal signs of chronic conditions such as diabetes and high blood pressure.  Dental: Annual dental screenings help detect early signs of oral cancer, gum disease, and other conditions linked to overall health, including heart disease and diabetes.  Please see the attached documents for additional preventive care recommendations.

## 2024-05-17 NOTE — Progress Notes (Signed)
 " I connected with  Amber Mccall on 05/17/24 in Office  Patient Location: Other:  in office  Provider Location: Office/Clinic  Persons Participating in Visit: Patient.  Chief Complaint  Patient presents with   Medicare Wellness     Subjective:   Amber Mccall is a 74 y.o. female who presents for a Medicare Annual Wellness Visit.  Visit info / Clinical Intake: Medicare Wellness Visit Type:: Initial Annual Wellness Visit Persons participating in visit and providing information:: patient Medicare Wellness Visit Mode:: In-person (required for WTM) Interpreter Needed?: Yes Interpreter Agency: [facility] communication Interpreter Name: JK Interpreter ID: none Patient Declined Interpreter : No Interpretation services provided by: Telephone interpreter observed visit Patient signed [place] waiver: No Pre-visit prep was completed: yes AWV questionnaire completed by patient prior to visit?: no Living arrangements:: with family/others Patient's Overall Health Status Rating: very good Typical amount of pain: some Does pain affect daily life?: no Are you currently prescribed opioids?: no  Dietary Habits and Nutritional Risks How many meals a day?: 3 Eats fruit and vegetables daily?: yes Most meals are obtained by: preparing own meals In the last 2 weeks, have you had any of the following?: none Diabetic:: no  Functional Status Activities of Daily Living (to include ambulation/medication): Independent Ambulation: Independent Medication Administration: Independent Home Management (perform basic housework or laundry): Independent Manage your own finances?: yes Primary transportation is: family / friends Concerns about vision?: no *vision screening is required for WTM* Concerns about hearing?: no  Fall Screening Falls in the past year?: 1 Number of falls in past year: 1 Was there an injury with Fall?: 1 Fall Risk Category Calculator: 3 Patient Fall Risk Level: High Fall Risk  Fall  Risk Patient at Risk for Falls Due to: History of fall(s) Fall risk Follow up: Falls evaluation completed  Home and Transportation Safety: All rugs have non-skid backing?: N/A, no rugs All stairs or steps have railings?: yes Grab bars in the bathtub or shower?: yes Have non-skid surface in bathtub or shower?: yes Good home lighting?: yes Regular seat belt use?: yes Hospital stays in the last year:: (!) yes How many hospital stays:: 1 Reason: knee surgery  Cognitive Assessment Difficulty concentrating, remembering, or making decisions? : yes Will 6CIT or Mini Cog be Completed: yes What year is it?: 0 points What month is it?: 0 points Give patient an address phrase to remember (5 components): [address] [place] About what time is it?: 0 points Count backwards from 20 to 1: 0 points Say the months of the year in reverse: 0 points Repeat the address phrase from earlier: 0 points 6 CIT Score: 0 points  Advance Directives (For Healthcare) Does Patient Have a Medical Advance Directive?: No Would patient like information on creating a medical advance directive?: No - Patient declined  Reviewed/Updated  Reviewed/Updated: Reviewed All (Medical, Surgical, Family, Medications, Allergies, Care Teams, Patient Goals)    Allergies (verified) Patient has no known allergies.   Current Medications (verified) Outpatient Encounter Medications as of 05/17/2024  Medication Sig   amLODipine  (NORVASC ) 10 MG tablet Take 1 tablet (10 mg total) by mouth daily.   buPROPion  (WELLBUTRIN  XL) 150 MG 24 hr tablet TAKE 1 TABLET(150 MG) BY MOUTH DAILY   calcium citrate (CALCITRATE - DOSED IN MG ELEMENTAL CALCIUM) 950 (200 Ca) MG tablet Take 950 mg by mouth daily.   denosumab -bbdz (JUBBONTI ) 60 MG/ML SOSY injection Inject 60 mg into the skin every 6 (six) months. Dx code: M81.0  pt has appt on 05/17/24   diclofenac  Sodium (VOLTAREN ) 1 % GEL Apply 4 g topically 3 (three) times daily as needed (pain).    gabapentin  (NEURONTIN ) 100 MG capsule Take 1 capsule (100 mg total) by mouth daily.   lidocaine  4 % Place 1 patch onto the skin daily.   pantoprazole  (PROTONIX ) 40 MG tablet TAKE 1 TABLET(40 MG) BY MOUTH DAILY   traZODone  (DESYREL ) 100 MG tablet TAKE 1 TABLET(100 MG) BY MOUTH AT BEDTIME   VITAMIN D, CHOLECALCIFEROL, PO Take 1 capsule by mouth daily.   Facility-Administered Encounter Medications as of 05/17/2024  Medication   denosumab -bbdz (JUBBONTI ) injection 60 mg    History: Past Medical History:  Diagnosis Date   Anginal pain    pt says she feels like this may be related to acid reflux   Closed fracture of metatarsal bone(s) 01/30/2013   Depression    Essential hypertension    GERD (gastroesophageal reflux disease)    Lumbar radiculopathy    Osteoarthritis    Osteoporosis    Past Surgical History:  Procedure Laterality Date   ABDOMINAL HYSTERECTOMY     APPENDECTOMY     CATARACT EXTRACTION Bilateral 2022   [place] Osteotomy w/ Bone Graft Left 04/16/2015   Left foot   IR RADIOLOGIST EVAL & MGMT  04/11/2017   OPEN REDUCTION INTERNAL FIXATION (ORIF) DISTAL PHALANX Left 10/23/2014   5th Metatarsal   TOTAL KNEE ARTHROPLASTY Right 10/18/2021   Procedure: RIGHT TOTAL KNEE ARTHROPLASTY;  Surgeon: Jerri Kay HERO, MD;  Location: [facility] OR;  Service: Orthopedics;  Laterality: Right;   TOTAL KNEE ARTHROPLASTY Left 06/20/2022   Procedure: LEFT TOTAL KNEE REPLACEMENT;  Surgeon: Jerri Kay HERO, MD;  Location: [facility] OR;  Service: Orthopedics;  Laterality: Left;   Family History  Problem Relation Age of Onset   Colon cancer Neg Hx    Rectal cancer Neg Hx    Stomach cancer Neg Hx    Social History   Occupational History   Occupation: retired  Tobacco Use   Smoking status: Never   Smokeless tobacco: Never  Vaping Use   Vaping status: Never Used  Substance and Sexual Activity   Alcohol use: Not Currently   Drug use: Not Currently   Sexual activity: Not on file   Tobacco  Counseling Counseling given: Not Answered  SDOH Screenings   Food Insecurity: No Food Insecurity (05/17/2024)  Housing: Low Risk (05/17/2024)  Transportation Needs: No Transportation Needs (05/17/2024)  Utilities: Not At Risk (05/17/2024)  Depression (PHQ2-9): Medium Risk (05/17/2024)  Physical Activity: Sufficiently Active (05/17/2024)  Social Connections: Socially Integrated (05/17/2024)  Stress: No Stress Concern Present (05/17/2024)  Tobacco Use: Low Risk (05/17/2024)  Health Literacy: Adequate Health Literacy (05/17/2024)   See flowsheets for full screening details  Depression Screen PHQ 2 & 9 Depression Scale- Over the past 2 weeks, how often have you been bothered by any of the following problems? Little interest or pleasure in doing things: 1 Feeling down, depressed, or hopeless (PHQ Adolescent also includes...irritable): 1 PHQ-2 Total Score: 2 Trouble falling or staying asleep, or sleeping too much: 1 Feeling tired or having little energy: 0 Poor appetite or overeating (PHQ Adolescent also includes...weight loss): 0 Feeling bad about yourself - or that you are a failure or have let yourself or your family down: 1 Trouble concentrating on things, such as reading the newspaper or watching television (PHQ Adolescent also includes...like school work): 0 Moving or speaking so slowly that other people could have noticed. Or the  opposite - being so fidgety or restless that you have been moving around a lot more than usual: 0 Thoughts that you would be better off dead, or of hurting yourself in some way: 1 PHQ-9 Total Score: 5 If you checked off any problems, how difficult have these problems made it for you to do your work, take care of things at home, or get along with other people?: Somewhat difficult  Depression Treatment Depression Interventions/Treatment : [id number] Score <4 Follow-up Not Indicated; Currently on Treatment     Goals Addressed               This Visit's Progress      feel better (pt-stated)        Patient states she will like to be healthier             Objective:    Today's Vitals   05/17/24 0928  BP: (!) 160/88  Pulse: 100  SpO2: 95%  Weight: 152 lb 9.6 oz (69.2 kg)  Height: 5' 1 (1.549 m)   Body mass index is 28.83 kg/m.  Hearing/Vision screen Hearing Screening - Comments:: No difficulties Vision Screening - Comments:: Patient wears glasses Immunizations and Health Maintenance Health Maintenance  Topic Date Due   Medicare Annual Wellness (AWV)  Never done   DTaP/Tdap/Td (1 - Tdap) Never done   COVID-19 Vaccine (8 - 2025-26 season) 12/25/2023   Mammogram  11/08/2024   Colonoscopy  10/08/2031   Pneumococcal Vaccine: 50+ Years  Completed   Influenza Vaccine  Completed   Bone Density Scan  Completed   Hepatitis C Screening  Completed   Zoster Vaccines- Shingrix  Completed   Meningococcal B Vaccine  Aged Out        Assessment/Plan:  This is a routine wellness examination for Chantae.  Patient Care Team: Frann Mabel Mt, DO as PCP - General (Family Medicine)  I have personally reviewed and noted the following in the patients chart:   Medical and social history Use of alcohol, tobacco or illicit drugs  Current medications and supplements including opioid prescriptions. Functional ability and status Nutritional status Physical activity Advanced directives List of other physicians Hospitalizations, surgeries, and ER visits in previous 12 months Vitals Screenings to include cognitive, depression, and falls Referrals and appointments  No orders of the defined types were placed in this encounter.  In addition, I have reviewed and discussed with patient certain preventive protocols, quality metrics, and best practice recommendations. A written personalized care plan for preventive services as well as general preventive health recommendations were provided to patient.   Lyle MARLA Right, [place]   05/17/2024   No  follow-ups on file.  After Visit Summary: (In Person-Declined) Patient declined AVS at this time.  Nurse Notes: No voiced concerns "

## 2024-07-11 ENCOUNTER — Ambulatory Visit: Admitting: Orthopaedic Surgery
